# Patient Record
Sex: Female | Born: 1935 | Smoking: Never smoker
Health system: Southern US, Community
[De-identification: ages and names within clinical notes are randomized; demographics above are authoritative.]

## PROBLEM LIST (undated history)

## (undated) DIAGNOSIS — E039 Hypothyroidism, unspecified: Secondary | ICD-10-CM

## (undated) DIAGNOSIS — E785 Hyperlipidemia, unspecified: Secondary | ICD-10-CM

## (undated) DIAGNOSIS — S329XXA Fracture of unspecified parts of lumbosacral spine and pelvis, initial encounter for closed fracture: Secondary | ICD-10-CM

## (undated) DIAGNOSIS — K922 Gastrointestinal hemorrhage, unspecified: Secondary | ICD-10-CM

## (undated) DIAGNOSIS — I509 Heart failure, unspecified: Secondary | ICD-10-CM

## (undated) DIAGNOSIS — I4891 Unspecified atrial fibrillation: Secondary | ICD-10-CM

## (undated) DIAGNOSIS — I10 Essential (primary) hypertension: Secondary | ICD-10-CM

---

## 2013-09-29 ENCOUNTER — Inpatient Hospital Stay (HOSPITAL_COMMUNITY): Payer: Medicare Other

## 2013-09-29 ENCOUNTER — Encounter (HOSPITAL_COMMUNITY): Payer: Self-pay | Admitting: Emergency Medicine

## 2013-09-29 ENCOUNTER — Emergency Department (HOSPITAL_COMMUNITY): Payer: Medicare Other

## 2013-09-29 ENCOUNTER — Inpatient Hospital Stay (HOSPITAL_COMMUNITY)
Admission: EM | Admit: 2013-09-29 | Discharge: 2013-10-02 | DRG: 061 | Disposition: A | Discharge status: Rehabilitation Facility | Payer: Medicare Other | Attending: Neurology | Admitting: Neurology

## 2013-09-29 ENCOUNTER — Encounter (HOSPITAL_COMMUNITY)
Admission: EM | Disposition: A | Discharge status: Rehabilitation Facility | Payer: Self-pay | Source: Home / Self Care | Attending: Neurology

## 2013-09-29 ENCOUNTER — Encounter (HOSPITAL_COMMUNITY): Payer: Medicare Other | Admitting: Anesthesiology

## 2013-09-29 ENCOUNTER — Encounter (HOSPITAL_COMMUNITY): Payer: Self-pay | Admitting: Anesthesiology

## 2013-09-29 ENCOUNTER — Emergency Department (HOSPITAL_COMMUNITY): Payer: Medicare Other | Admitting: Anesthesiology

## 2013-09-29 DIAGNOSIS — I6350 Cerebral infarction due to unspecified occlusion or stenosis of unspecified cerebral artery: Principal | ICD-10-CM

## 2013-09-29 DIAGNOSIS — Z79899 Other long term (current) drug therapy: Secondary | ICD-10-CM

## 2013-09-29 DIAGNOSIS — E785 Hyperlipidemia, unspecified: Secondary | ICD-10-CM | POA: Diagnosis present

## 2013-09-29 DIAGNOSIS — I509 Heart failure, unspecified: Secondary | ICD-10-CM | POA: Diagnosis present

## 2013-09-29 DIAGNOSIS — R29810 Facial weakness: Secondary | ICD-10-CM | POA: Diagnosis present

## 2013-09-29 DIAGNOSIS — E876 Hypokalemia: Secondary | ICD-10-CM | POA: Diagnosis present

## 2013-09-29 DIAGNOSIS — G9340 Encephalopathy, unspecified: Secondary | ICD-10-CM | POA: Diagnosis present

## 2013-09-29 DIAGNOSIS — G8110 Spastic hemiplegia affecting unspecified side: Secondary | ICD-10-CM | POA: Diagnosis present

## 2013-09-29 DIAGNOSIS — R9431 Abnormal electrocardiogram [ECG] [EKG]: Secondary | ICD-10-CM

## 2013-09-29 DIAGNOSIS — K274 Chronic or unspecified peptic ulcer, site unspecified, with hemorrhage: Secondary | ICD-10-CM | POA: Diagnosis present

## 2013-09-29 DIAGNOSIS — I4891 Unspecified atrial fibrillation: Secondary | ICD-10-CM | POA: Diagnosis present

## 2013-09-29 DIAGNOSIS — Z23 Encounter for immunization: Secondary | ICD-10-CM

## 2013-09-29 DIAGNOSIS — D696 Thrombocytopenia, unspecified: Secondary | ICD-10-CM | POA: Diagnosis present

## 2013-09-29 DIAGNOSIS — K219 Gastro-esophageal reflux disease without esophagitis: Secondary | ICD-10-CM | POA: Diagnosis present

## 2013-09-29 DIAGNOSIS — D649 Anemia, unspecified: Secondary | ICD-10-CM | POA: Diagnosis present

## 2013-09-29 DIAGNOSIS — R1310 Dysphagia, unspecified: Secondary | ICD-10-CM | POA: Diagnosis present

## 2013-09-29 DIAGNOSIS — I10 Essential (primary) hypertension: Secondary | ICD-10-CM | POA: Diagnosis present

## 2013-09-29 DIAGNOSIS — I639 Cerebral infarction, unspecified: Secondary | ICD-10-CM

## 2013-09-29 DIAGNOSIS — R29898 Other symptoms and signs involving the musculoskeletal system: Secondary | ICD-10-CM

## 2013-09-29 DIAGNOSIS — E039 Hypothyroidism, unspecified: Secondary | ICD-10-CM | POA: Diagnosis present

## 2013-09-29 DIAGNOSIS — I63511 Cerebral infarction due to unspecified occlusion or stenosis of right middle cerebral artery: Secondary | ICD-10-CM

## 2013-09-29 DIAGNOSIS — J9600 Acute respiratory failure, unspecified whether with hypoxia or hypercapnia: Secondary | ICD-10-CM | POA: Diagnosis present

## 2013-09-29 HISTORY — DX: Hypothyroidism, unspecified: E03.9

## 2013-09-29 HISTORY — DX: Unspecified atrial fibrillation: I48.91

## 2013-09-29 HISTORY — DX: Fracture of unspecified parts of lumbosacral spine and pelvis, initial encounter for closed fracture: S32.9XXA

## 2013-09-29 HISTORY — DX: Heart failure, unspecified: I50.9

## 2013-09-29 HISTORY — DX: Essential (primary) hypertension: I10

## 2013-09-29 HISTORY — DX: Gastrointestinal hemorrhage, unspecified: K92.2

## 2013-09-29 HISTORY — DX: Hyperlipidemia, unspecified: E78.5

## 2013-09-29 LAB — COMPREHENSIVE METABOLIC PANEL
Albumin: 3.5 g/dL (ref 3.5–5.2)
Alkaline Phosphatase: 98 U/L (ref 39–117)
BUN: 22 mg/dL (ref 6–23)
CO2: 28 mEq/L (ref 19–32)
Calcium: 9.8 mg/dL (ref 8.4–10.5)
Chloride: 96 mEq/L (ref 96–112)
GFR calc Af Amer: 71 mL/min — ABNORMAL LOW (ref >90)
GFR calc non Af Amer: 61 mL/min — ABNORMAL LOW (ref >90)
Glucose, Bld: 158 mg/dL — ABNORMAL HIGH (ref 70–99)
Potassium: 3.4 mEq/L — ABNORMAL LOW (ref 3.5–5.1)
Total Bilirubin: 0.3 mg/dL (ref 0.3–1.2)

## 2013-09-29 LAB — GLUCOSE, CAPILLARY: Glucose-Capillary: 134 mg/dL — ABNORMAL HIGH (ref 70–99)

## 2013-09-29 LAB — POCT I-STAT, CHEM 8
BUN: 26 mg/dL — ABNORMAL HIGH (ref 6–23)
Glucose, Bld: 157 mg/dL — ABNORMAL HIGH (ref 70–99)
HCT: 36 % (ref 36.0–46.0)
Hemoglobin: 12.2 g/dL (ref 12.0–15.0)
Sodium: 139 mEq/L (ref 135–145)
TCO2: 30 mmol/L (ref 0–100)

## 2013-09-29 LAB — RAPID URINE DRUG SCREEN, HOSP PERFORMED
Amphetamines: NOT DETECTED
Cocaine: NOT DETECTED
Opiates: POSITIVE — AB

## 2013-09-29 LAB — PROTIME-INR: INR: 0.87 (ref 0.00–1.49)

## 2013-09-29 LAB — DIFFERENTIAL
Basophils Absolute: 0 10*3/uL (ref 0.0–0.1)
Lymphocytes Relative: 30 % (ref 12–46)
Lymphs Abs: 1.9 10*3/uL (ref 0.7–4.0)
Monocytes Relative: 10 % (ref 3–12)
Neutro Abs: 3.6 10*3/uL (ref 1.7–7.7)
Neutrophils Relative %: 58 % (ref 43–77)

## 2013-09-29 LAB — POCT I-STAT 3, ART BLOOD GAS (G3+)
Acid-Base Excess: 11 mmol/L — ABNORMAL HIGH (ref 0.0–2.0)
Bicarbonate: 35.1 mEq/L — ABNORMAL HIGH (ref 20.0–24.0)
O2 Saturation: 100 %
Patient temperature: 98.6
TCO2: 36 mmol/L (ref 0–100)
pCO2 arterial: 43.7 mmHg (ref 35.0–45.0)
pO2, Arterial: 493 mmHg — ABNORMAL HIGH (ref 80.0–100.0)

## 2013-09-29 LAB — URINALYSIS, ROUTINE W REFLEX MICROSCOPIC
Glucose, UA: NEGATIVE mg/dL
Hgb urine dipstick: NEGATIVE
Leukocytes, UA: NEGATIVE
Protein, ur: NEGATIVE mg/dL

## 2013-09-29 LAB — CBC
HCT: 32.8 % — ABNORMAL LOW (ref 36.0–46.0)
Hemoglobin: 10.1 g/dL — ABNORMAL LOW (ref 12.0–15.0)
MCH: 24.7 pg — ABNORMAL LOW (ref 26.0–34.0)
MCV: 80.2 fL (ref 78.0–100.0)
RBC: 4.09 MIL/uL (ref 3.87–5.11)

## 2013-09-29 LAB — POCT I-STAT TROPONIN I: Troponin i, poc: 0.02 ng/mL (ref 0.00–0.08)

## 2013-09-29 LAB — TROPONIN I: Troponin I: <0.3 ng/mL (ref <0.30)

## 2013-09-29 LAB — APTT: aPTT: 20 seconds — ABNORMAL LOW (ref 24–37)

## 2013-09-29 SURGERY — RADIOLOGY WITH ANESTHESIA
Anesthesia: General

## 2013-09-29 MED ORDER — LIDOCAINE HCL (CARDIAC) 20 MG/ML IV SOLN
INTRAVENOUS | Status: DC | PRN
  Administered 2013-09-29: 50 mg via INTRAVENOUS

## 2013-09-29 MED ORDER — INSULIN ASPART 100 UNIT/ML ~~LOC~~ SOLN
0.0000 [IU] | SUBCUTANEOUS | Status: DC
  Administered 2013-09-29 – 2013-10-02 (×2): 2 [IU] via SUBCUTANEOUS
  Administered 2013-10-02: 1 [IU] via SUBCUTANEOUS

## 2013-09-29 MED ORDER — SOTALOL HCL 80 MG PO TABS
80.0000 mg | ORAL_TABLET | Freq: Two times a day (BID) | ORAL | Status: DC
  Administered 2013-09-30 – 2013-10-02 (×5): 80 mg via ORAL
  Filled 2013-09-29 (×6): qty 1

## 2013-09-29 MED ORDER — PANTOPRAZOLE SODIUM 40 MG IV SOLR
40.0000 mg | Freq: Every day | INTRAVENOUS | Status: DC
  Administered 2013-09-29 – 2013-09-30 (×2): 40 mg via INTRAVENOUS
  Filled 2013-09-29 (×3): qty 40

## 2013-09-29 MED ORDER — PROPOFOL 10 MG/ML IV EMUL
5.0000 ug/kg/min | INTRAVENOUS | Status: DC
  Administered 2013-09-29: 30 ug/kg/min via INTRAVENOUS
  Administered 2013-09-30: 20 ug/kg/min via INTRAVENOUS
  Filled 2013-09-29 (×2): qty 100

## 2013-09-29 MED ORDER — NITROGLYCERIN 1 MG/10 ML FOR IR/CATH LAB
INTRA_ARTERIAL | Status: AC
  Administered 2013-09-29: 1 ug
  Filled 2013-09-29: qty 10

## 2013-09-29 MED ORDER — MIDAZOLAM HCL 2 MG/2ML IJ SOLN
INTRAMUSCULAR | Status: AC
  Filled 2013-09-29: qty 2

## 2013-09-29 MED ORDER — ACETAMINOPHEN 650 MG RE SUPP: 650.0000 mg | Freq: Four times a day (QID) | RECTAL | Status: DC | PRN

## 2013-09-29 MED ORDER — FENTANYL CITRATE 0.05 MG/ML IJ SOLN
INTRAMUSCULAR | Status: AC
  Filled 2013-09-29: qty 2

## 2013-09-29 MED ORDER — SODIUM CHLORIDE 0.9 % IV SOLN
INTRAVENOUS | Status: DC
  Administered 2013-09-29 – 2013-09-30 (×2): via INTRAVENOUS

## 2013-09-29 MED ORDER — BIOTENE DRY MOUTH MT LIQD
15.0000 mL | Freq: Four times a day (QID) | OROMUCOSAL | Status: DC
  Administered 2013-09-29 – 2013-10-01 (×9): 15 mL via OROMUCOSAL

## 2013-09-29 MED ORDER — LABETALOL HCL 5 MG/ML IV SOLN
INTRAVENOUS | Status: DC | PRN
  Administered 2013-09-29 (×3): 10 mg via INTRAVENOUS

## 2013-09-29 MED ORDER — LABETALOL HCL 5 MG/ML IV SOLN
10.0000 mg | INTRAVENOUS | Status: DC | PRN
  Administered 2013-09-29 (×2): 5 mg via INTRAVENOUS
  Filled 2013-09-29 (×2): qty 4

## 2013-09-29 MED ORDER — ACETAMINOPHEN 325 MG PO TABS: 650.0000 mg | ORAL_TABLET | ORAL | Status: DC | PRN

## 2013-09-29 MED ORDER — STROKE: EARLY STAGES OF RECOVERY BOOK
Freq: Once | Status: AC
  Administered 2013-09-29: 18:00:00
  Filled 2013-09-29: qty 1

## 2013-09-29 MED ORDER — POTASSIUM CHLORIDE 10 MEQ/100ML IV SOLN: 10.0000 meq | Freq: Once | INTRAVENOUS | Status: DC

## 2013-09-29 MED ORDER — CEFAZOLIN SODIUM 1-5 GM-% IV SOLN
INTRAVENOUS | Status: DC | PRN
  Administered 2013-09-29: 2 g via INTRAVENOUS

## 2013-09-29 MED ORDER — ALTEPLASE 30 MG/30 ML FOR INTERV. RAD
1.0000 mg | INTRA_ARTERIAL | Status: AC | PRN
  Administered 2013-09-29: 5.1 mg via INTRA_ARTERIAL
  Filled 2013-09-29: qty 30

## 2013-09-29 MED ORDER — FENTANYL CITRATE 0.05 MG/ML IJ SOLN
INTRAMUSCULAR | Status: DC | PRN
  Administered 2013-09-29: 100 ug via INTRAVENOUS
  Administered 2013-09-29: 50 ug via INTRAVENOUS
  Administered 2013-09-29: 100 ug via INTRAVENOUS

## 2013-09-29 MED ORDER — IOHEXOL 300 MG/ML  SOLN
150.0000 mL | Freq: Once | INTRAMUSCULAR | Status: AC | PRN
  Administered 2013-09-29: 70 mL via INTRA_ARTERIAL

## 2013-09-29 MED ORDER — MIDAZOLAM HCL 2 MG/2ML IJ SOLN
INTRAMUSCULAR | Status: AC | PRN
  Administered 2013-09-29: 1 mg via INTRAVENOUS

## 2013-09-29 MED ORDER — CHLORHEXIDINE GLUCONATE 0.12 % MT SOLN
15.0000 mL | Freq: Two times a day (BID) | OROMUCOSAL | Status: DC
  Administered 2013-09-29 – 2013-10-01 (×5): 15 mL via OROMUCOSAL
  Filled 2013-09-29 (×5): qty 15

## 2013-09-29 MED ORDER — NICARDIPINE HCL IN NACL 20-0.86 MG/200ML-% IV SOLN
5.0000 mg/h | INTRAVENOUS | Status: DC
  Administered 2013-09-29 (×2): 5 mg/h via INTRAVENOUS
  Administered 2013-09-30: 3 mg/h via INTRAVENOUS
  Administered 2013-09-30: 5 mg/h via INTRAVENOUS
  Filled 2013-09-29 (×4): qty 200

## 2013-09-29 MED ORDER — SODIUM CHLORIDE 0.9 % IV SOLN: INTRAVENOUS | Status: DC

## 2013-09-29 MED ORDER — INFLUENZA VAC SPLIT QUAD 0.5 ML IM SUSP
0.5000 mL | INTRAMUSCULAR | Status: AC
  Administered 2013-09-30: 0.5 mL via INTRAMUSCULAR
  Filled 2013-09-29: qty 0.5

## 2013-09-29 MED ORDER — ALTEPLASE (STROKE) FULL DOSE INFUSION
59.0000 mg | Freq: Once | INTRAVENOUS | Status: DC
  Filled 2013-09-29: qty 59

## 2013-09-29 MED ORDER — PROPOFOL 10 MG/ML IV BOLUS
INTRAVENOUS | Status: DC | PRN
  Administered 2013-09-29: 100 mg via INTRAVENOUS

## 2013-09-29 MED ORDER — SODIUM CHLORIDE 0.9 % IV SOLN
INTRAVENOUS | Status: DC | PRN
  Administered 2013-09-29: 10:00:00 via INTRAVENOUS

## 2013-09-29 MED ORDER — SUCCINYLCHOLINE CHLORIDE 20 MG/ML IJ SOLN
INTRAMUSCULAR | Status: DC | PRN
  Administered 2013-09-29: 110 mg via INTRAVENOUS

## 2013-09-29 MED ORDER — ACETAMINOPHEN 650 MG RE SUPP: 650.0000 mg | RECTAL | Status: DC | PRN

## 2013-09-29 MED ORDER — FENTANYL CITRATE 0.05 MG/ML IJ SOLN
INTRAMUSCULAR | Status: AC | PRN
  Administered 2013-09-29: 25 ug via INTRAVENOUS

## 2013-09-29 MED ORDER — ACETAMINOPHEN 500 MG PO TABS
1000.0000 mg | ORAL_TABLET | Freq: Four times a day (QID) | ORAL | Status: DC | PRN
  Administered 2013-09-30: 1000 mg via ORAL
  Filled 2013-09-29: qty 2

## 2013-09-29 MED ORDER — PNEUMOCOCCAL VAC POLYVALENT 25 MCG/0.5ML IJ INJ
0.5000 mL | INJECTION | INTRAMUSCULAR | Status: AC
  Administered 2013-09-30: 0.5 mL via INTRAMUSCULAR
  Filled 2013-09-29: qty 0.5

## 2013-09-29 MED ORDER — SODIUM CHLORIDE 0.9 % IV BOLUS (SEPSIS): 1000.0000 mL | Freq: Once | INTRAVENOUS | Status: DC

## 2013-09-29 MED ORDER — POTASSIUM CHLORIDE 20 MEQ/15ML (10%) PO LIQD
40.0000 meq | Freq: Once | ORAL | Status: AC
  Administered 2013-09-29: 40 meq
  Filled 2013-09-29: qty 30

## 2013-09-29 MED ORDER — PROPOFOL INFUSION 10 MG/ML OPTIME
INTRAVENOUS | Status: DC | PRN
  Administered 2013-09-29: 50 ug/kg/min via INTRAVENOUS

## 2013-09-29 MED ORDER — ROCURONIUM BROMIDE 100 MG/10ML IV SOLN
INTRAVENOUS | Status: DC | PRN
  Administered 2013-09-29 (×2): 50 mg via INTRAVENOUS

## 2013-09-29 MED ORDER — ONDANSETRON HCL 4 MG/2ML IJ SOLN
4.0000 mg | Freq: Four times a day (QID) | INTRAMUSCULAR | Status: DC | PRN
  Administered 2013-10-02: 4 mg via INTRAVENOUS
  Filled 2013-09-29: qty 2

## 2013-09-29 NOTE — Transfer of Care (Signed)
Immediate Anesthesia Transfer of Care Note  Patient: Tina Chambers  Procedure(s) Performed: * No procedures listed *  Patient Location: NICU  Anesthesia Type:General  Level of Consciousness: sedated  Airway & Oxygen Therapy: Patient remains intubated per anesthesia plan and Patient placed on Ventilator (see vital sign flow sheet for setting)  Post-op Assessment: Report given to PACU RN and Post -op Vital signs reviewed and stable  Post vital signs: Reviewed and stable  Complications: No apparent anesthesia complications

## 2013-09-29 NOTE — ED Notes (Signed)
Cannulation to R and L femoral arteries

## 2013-09-29 NOTE — Anesthesia Postprocedure Evaluation (Signed)
  Anesthesia Post-op Note  Patient: Tina Chambers  Procedure(s) Performed: * No procedures listed *  Patient Location: ICU  Anesthesia Type:General  Level of Consciousness: sedated and unresponsive  Airway and Oxygen Therapy: Patient remains intubated per anesthesia plan and Patient placed on Ventilator (see vital sign flow sheet for setting)  Post-op Pain: none  Post-op Assessment: Post-op Vital signs reviewed, Patient's Cardiovascular Status Stable and Respiratory Function Stable  Post-op Vital Signs: Reviewed and stable  Complications: No apparent anesthesia complications

## 2013-09-29 NOTE — Consult Note (Signed)
PULMONARY  / CRITICAL CARE MEDICINE  Name: Tina Chambers MRN: [id number] DOB: 12/08/1926    ADMISSION DATE:  09/29/2013 CONSULTATION DATE:  09/29/2013  REFERRING MD :  Neurology  PRIMARY SERVICE:  Neurology  CHIEF COMPLAINT:  Acute respiratory failure  BRIEF PATIENT DESCRIPTION: 79 yo with past medical history of HTN, AF (stoped Xarelto because of bleeding ulcers) and CHF admitted 11/4 with acute CVA rewiring IR intervention. Brought to ICU intubated.  SIGNIFICANT EVENTS / STUDIES:  11/4  Admitted with acute CVA 11/4  Head CT >>> nad 11/4  IR >>> R common carotid angiogram, revascularization of R MCA with thrombectomy device 11/4  In ICU, intubated  LINES / TUBES: OETT 11/4 >>> OGT 11/4 >>> Foley 11/4 >>> R fem A-line 11/4 >>> L fem A-line 11/4 >>>  CULTURES:  ANTIBIOTICS:  The patient is encephalopathic and unable to provide history, which was obtained for available medical records.  HISTORY OF PRESENT ILLNESS:  79 yo with past medical history of HTN, AF (stoped Xarelto because of bleeding ulcers) and CHF admitted 11/4 with acute CVA rewiring IR intervention. Brought to ICU intubated.  PAST MEDICAL HISTORY :  Past Medical History  Diagnosis Date  . Hypertension   . Atrial fibrillation   . CHF (congestive heart failure)   . Hypothyroid   . Hyperlipidemia    History reviewed. No pertinent past surgical history. Prior to Admission medications   Not on File   Allergies  Allergen Reactions  . Phenergan [Promethazine Hcl]    FAMILY HISTORY:  No family history on file.  SOCIAL HISTORY:  has no tobacco, alcohol, and drug history on file.  REVIEW OF SYSTEMS:  Unable to provide.  INTERVAL HISTORY:  VITAL SIGNS: Temp:  [98.6 F (37 C)] 98.6 F (37 C) (11/04 0907) Pulse Rate:  [42-99] 92 (11/04 1005) Resp:  [16-20] 16 (11/04 1005) BP: (142-193)/(66-100) 193/87 mmHg (11/04 1005) SpO2:  [98 %-100 %] 100 % (11/04 1005) Weight:  [65.4 kg (144 lb 2.9 oz)]  65.4 kg (144 lb 2.9 oz) (11/04 0907)  HEMODYNAMICS:   VENTILATOR SETTINGS:   INTAKE / OUTPUT: Intake/Output     11/03 0701 - 11/04 0700 11/04 0701 - 11/05 0700   I.V. (mL/kg)  800 (12.2)   Total Intake(mL/kg)  800 (12.2)   Blood  50   Total Output   50   Net   +750         PHYSICAL EXAMINATION: General:  Mechanically ventilated, synchronous Neuro:  Encephalopathic, nonfocal, cough / gag diminished HEENT:  PERRL, OETT Cardiovascular:  RRR, no m/r/g Lungs:  Bilateral diminished air entry, no w/r/r Abdomen:  Soft, nontender, bowel sounds diminished Musculoskeletal:  No edema Skin:  Intact  LABS:  Recent Labs Lab 09/29/13 0849 09/29/13 0853 09/29/13 0902  HGB  --  10.1* 12.2  WBC  --  6.3  --   PLT  --  PLATELET CLUMPS NOTED ON SMEAR, UNABLE TO ESTIMATE  --   NA  --  138 139  K  --  3.4* 3.1*  CL  --  96 95*  CO2  --  28  --   GLUCOSE  --  158* 157*  BUN  --  22 26*  CREATININE  --  0.85 1.10  CALCIUM  --  9.8  --   MG 1.6  --   --   AST  --  28  --   ALT  --  17  --   ALKPHOS  --  98  --   BILITOT  --  0.3  --   PROT  --  6.9  --   ALBUMIN  --  3.5  --   APTT  --  20*  --   INR  --  0.87  --   TROPONINI  --  <0.30  --    No results found for this basename: GLUCAP,  in the last 168 hours  CXR:  11/4 >>>  ASSESSMENT / PLAN:  PULMONARY A:  Acute respiratory failure in setting of acute CVA / intervention. P:   Gaol SpO2>92, pH>7.30 Full mechanical support Daily SBT Ventilator bundle Trend ABG / CXR  CARDIOVASCULAR A: NTH. PAF. Dyslipidemia. P:  BP goals per IR / Neurology  RENAL A:  Hypokalemia. P:   Trend BMP K 40 x 1 NS@75   GASTROINTESTINAL A:  No active issues. P:   NPO as intubated Protonix for GI Px NGT  HEMATOLOGIC A:  No active issues. P:  Trend CBC SCDs for DVT Px  INFECTIOUS A:  No acute issues. P:   No intervention required  ENDOCRINE  A:  Hyperglycemia. P:   SSI  NEUROLOGIC A:  Acute CVA s/p thrombectomy P:    Per Neurology Goal RASS 0 to -1 Propofol gtt  I have personally obtained history, examined patient, evaluated and interpreted laboratory and imaging results, reviewed medical records, formulated assessment / plan and placed orders.  CRITICAL CARE:  The patient is critically ill with multiple organ systems failure and requires high complexity decision making for assessment and support, frequent evaluation and titration of therapies, application of advanced monitoring technologies and extensive interpretation of multiple databases. Critical Care Time devoted to patient care services described in this note is 40 minutes.   Lonia Farber, MD Pulmonary and Critical Care Medicine [facility] Pager: [phone]  09/29/2013, 12:46 PM

## 2013-09-29 NOTE — Consult Note (Signed)
Neurology Consultation Reason for Consult: Left side weakness Referring Physician: ED  CC: left side weakness  History is obtained from: patient and EMS   HPI: Tina Chambers is a 79 y.o. female with PMH of HTN, hyperlipidemia, CHF, hypothyroidism, A. Fib (currently not on blood thinner), recent Gi bleeding secondary to peptic ulcer disease, who presents with left-sided weakness.  Patient is a resident of assisted living facility ([address] She has been doing fine until this morning. She was walking this morning, went to sit down at 8:00 AM. The workers noticed that she wasn't moving or interacting as normal. She was found to have left sided weakness and facial droopy. She could not move her left arm and leg initially. EMS was called and took patient to ED. She was found to have right sided gaze, left facial droopy and left arm weakness in ED. She moves both her legs well. She is interactive for interview and answers questions with slurry speech. Stat CT head has no intracranial bleeding. Her blood pressure is 142/66, heart rate 90, oxygen saturation 94% on room air.  Of note, patient has hx GI bleeding secondary to peptic ulcer disease. Most recent GI bleeding was proximately 2 weeks ago. She has a. fib, and was blood thinner before, which was discontinued because of recent GI bleeding.  LKW: 8:00AM tpa given: no, recent GI bleed NIHSS: 21  ROS: A 14 point ROS was performed and is negative except as noted in the HPI.  Past Medical History  Diagnosis Date  . Hypertension   . Atrial fibrillation     Social History: she is a resident in assisted living facility, has 4 children, denies smoking, drinking alcohol, using drugs, or recent long distance traveling. Tob: none  Exam: Current vital signs: BP 142/66  Pulse 88  Temp(Src) 98.6 F (37 C) (Oral)  Resp 20  Wt 144 lb 2.9 oz (65.4 kg)  SpO2 100% Vital signs in last 24 hours: Temp:  [98.6 F (37 C)] 98.6 F (37  C) (11/04 0907) Pulse Rate:  [88] 88 (11/04 0907) Resp:  [20] 20 (11/04 0907) BP: (142)/(66) 142/66 mmHg (11/04 0907) SpO2:  [98 %-100 %] 100 % (11/04 0907) Weight:  [144 lb 2.9 oz (65.4 kg)] 144 lb 2.9 oz (65.4 kg) (11/04 1191)   General: Not in acute distress.  HEENT: Right eye gaze deviation.  no scleral icterus, No JVD or bruit, L pupils slightly larger than right.  Cardiac: S1/S2, irregular rhythm,  No murmurs, gallops or rubs Pulm: Good air movement bilaterally. Clear to auscultation bilaterally. No rales, wheezing, rhonchi or rubs. Abd: Soft, nondistended, nontender, no rebound pain, no organomegaly, BS present Ext: No edema. 2+DP/PT pulse bilaterally Musculoskeletal: No joint deformities, erythema, or stiffness Skin: No rashes.  Neuro:  Mental Status: Alert, oriented to person (recognizing his on the bedside), slurry speech. Cranial Nerves: II: decreased vision on the left side. III,IV, V,VI: Decreased sensation on the left face,  R eye gaze deviation  VII: left facial droopy VIII: hearing normal bilaterally IX,X: not able assess XI: not be able to assess XII: not be able to assess Motor: Right : Upper and lower extremity: 5/5 ;  Left:    Upper extremity 0/5 to 1/5; lower extremity: 4/5  Sensory: Pinprick and light touch decreased on the left side of body and face Deep Tendon Reflexes: 1+ and symmetric for knee reflex Plantars: Right: downgoing;   Left: upgoing Cerebellar: not be able to assess  Assessment and plan:  #: Ischemic stroke: Patient presents with acute onset of left upper and lower extremity paresthesia and weakness. CT head has no intracranial bleeding, CNS lesion, SDH/SAH, or other acute process. It is most likely due to ischemic stroke, possibly involved MCA. The etiology is likely due to A. fib. Because of GI bleeding secondary to peptic ulcer, patient is not taking blood thinner currently. Other risk factors include hyperlipidemia, hypertension and  hypothyroidism. Although patient is still within the window for tPA, her history of recent GI bleeding is contraindicated to tPA. Currently patient is hemodynamically stable. .   - NPO until SLP done - consult to IR for angiogram and intervention - Obtain MRI to evaluate for acute stroke - Check carotid dopplers to assess for stenosis - 2D echo - hold blood thinner now - Risk factor stratification: Obtain fasting lipid panel, Hgb A1c and TSH - allow permissive HTN - PT/OT consult when stabilized.  #: HTN: bp is 142/66 on admission. -will allow permissive hypertension  -Will hold blood pressure medications  #: HLD: on Lipitor 10 mg at home -will continue Lipitor -check FLP, may increase the dose of Lipitor if not at goal  #: A. Fib: Patient is not a candidate for Coumadin because of active peptic ulcer. Her heart rate is well controlled by cardiogram and sotalolol. Heart rate is 90 on admission. hold Cardizem for now, but will continue sotalolol   #:  Hypothyroidism: Patient is on Synthroid 75 mcg daily at home. -will continue Synthroid  -Check TSH  #: CHF: Patient has congestive heart failure per her son. No 2-D echo record the Epic. Currently patient is euvolemic. She does not have leg edema or JVD. Her lung auscultation is clear bilaterally. She has been on low dose of Lasix 10 mg daily at home. -will hold Lasix to allow permissive hypertension in the setting of acute ischemic stroke. -pending 2D echo.   Lorretta Harp, MD PGY3, Internal Medicine Teaching Service Pager: [phone]  I have seen and evaluated the patient. I have reviewed the above note and agree with the above.   79 yo was eating breakfast when she had acute onset left sided weakness.   She had left sided weakness,   Recent GI bleed, so no IV tpa, but went to intervention and found to have M1 occlusion which was opened with IA tpa and solitaire.   See above for further details.    This patient is critically  ill and at significant risk of neurological worsening, death and care requires constant monitoring of vital signs, hemodynamics,respiratory and cardiac monitoring, neurological assessment, discussion with family, other specialists and medical decision making of high complexity. I spent 60 minutes of neurocritical care time  in the care of  this patient.  Ritta Slot, MD [facility] [phone]  If 7pm- 7am, please page neurology on call at [phone].  09/29/2013  10:13 PM

## 2013-09-29 NOTE — Plan of Care (Signed)
Problem: tPA Day Progression Outcomes-Only if tPA administered Goal: Inclusion criteria for tPA STANDARD: < 3hours from symptoms onset: - Diagnosis of ischemic stroke causing measureable neurological deficit - Neurological signs shold not be minor & isolated - Symptoms of stroke should not be sugestive of SAH - No head trauma or prior stroke in previous 3 months - No gastrointestinal or urinary tract hemorrhage in previous 21 days - No arterial puncture at a noncompressible site in previous 7 days - BP not elevated [systolic <185 mmHg, diastolic < 110 mmHg] - Not taking oral anticoagulant or if being taken INR < or equal 1.7 - Platelet count > or equal 100,000 mm3 - No seizure w/postictal residual neurological impairments - Pt/family understand potential risks/benefits from treatment - Caution in pts with NIHSS > 22 - Seizure at stroke onset eligible for tPA if residual impairments due to stroke and not the seizures. - Neurological signs should not be clearing spontaneously - Exercise caution in treating pt with major deficits - Symptoms onset < 3hrs before beginning treatment - No MI in previous 3 months - No major surgery in previous 14 days - No history previous intracranial hemorrhage - No evidence active bleeding/acute trauma (fracture) on examinations - If receiving heparin in previous 48 hrs, aPTT must be normal range - Abnormal Blood Glucose < 50 or > 400 mg/dL - CT does not show multilobar infarction [hypodensity >1/3 cerebral hemisphere] EXTENDED: 3-4.5 hrous from symptom onset - Age > 80 years - History of prior stroke and diabetes - Any anticoagulant use prior to admission (even if INR < 1.7) - NIHSS > 25"  Outcome: Not Applicable Date Met:  09/29/13 Pt not a candidate for tpa d/t recent GI bleed

## 2013-09-29 NOTE — Code Documentation (Signed)
79yo female from [facility] assisted living arriving to [facility] via [facility] at [phone].  Patient was at her baseline this morning at 0800 when she walked with staff.  Later she was found in a chair not interacting per her usual with other people.  EMS noted L facial droop and L sided weakness on assessment.  EMS reports that patient was in afib en route and began moving L side before arriving to the hospital.  Code stroke called at 0831, LKW 0800, EDP exam/cleared for CT at [phone], stroke team arrival at 0840, neurologist arrival at [phone], patient arrival in CT at [phone], phlebotomist arrival at [phone].  NIHSS 21 on arrival, see documentation for details.  Patient contraindicated for treatment with tPA d/t recent GI bleed.  Son reports that patient had been on Xarelto for her afib, but stopped taking the medication d/t bleeding ulcers.  Son confirms that patient had blood in her stool 3 weeks ago.  IR notified at 0905, and patient arrival in IR at 71.  Patient's son and daughter updated on plan of care and agreeable with patient undergoing angiogram and possible intervention.  Handoff completed with IR RN and staff.

## 2013-09-29 NOTE — ED Notes (Signed)
Pt transported to IR by IR staff. Belongings given to Pt family member

## 2013-09-29 NOTE — ED Notes (Signed)
Pt was from [facility] ([address] was walking this morning, went to sit down at 0800. Workers noticed she wasn't moving or interacting as normal. Noticed left sided droop and weakness. Ems was called. Per ems, right sided gaze, left sided weakness, and L facial droop. Pt was unable to move left leg until few minutes before arrival.

## 2013-09-29 NOTE — H&P (Signed)
Neurology Consultation Reason for Consult: Left side weakness Referring Physician: ED  CC: left side weakness  History is obtained from: patient and EMS   HPI: Tina Chambers is a 79 y.o. female with PMH of HTN, hyperlipidemia, CHF, hypothyroidism, A. Fib (currently not on blood thinner), recent Gi bleeding secondary to peptic ulcer disease, who presents with left-sided weakness.  Patient is a resident of assisted living facility ([address] She has been doing fine until this morning. She was walking this morning, went to sit down at 8:00 AM. The workers noticed that she wasn't moving or interacting as normal. She was found to have left sided weakness and facial droopy. She could not move her left arm and leg initially. EMS was called and took patient to ED. She was found to have right sided gaze, left facial droopy and left arm weakness in ED. She moves both her legs well. She is interactive for interview and answers questions with slurry speech. Stat CT head has no intracranial bleeding. Her blood pressure is 142/66, heart rate 90, oxygen saturation 94% on room air.  Of note, patient has hx GI bleeding secondary to peptic ulcer disease. Most recent GI bleeding was proximately 2 weeks ago. She has a. fib, and was blood thinner before, which was discontinued because of recent GI bleeding.  LKW: 8:00AM tpa given: no, recent GI bleed NIHSS: 21  ROS: A 14 point ROS was performed and is negative except as noted in the HPI.  Past Medical History  Diagnosis Date  . Hypertension   . Atrial fibrillation     Social History: she is a resident in assisted living facility, has 4 children, denies smoking, drinking alcohol, using drugs, or recent long distance traveling. Tob: none  Exam: Current vital signs: BP 142/66  Pulse 88  Temp(Src) 98.6 F (37 C) (Oral)  Resp 20  Wt 144 lb 2.9 oz (65.4 kg)  SpO2 100% Vital signs in last 24 hours: Temp:  [98.6 F (37 C)] 98.6 F (37  C) (11/04 0907) Pulse Rate:  [88] 88 (11/04 0907) Resp:  [20] 20 (11/04 0907) BP: (142)/(66) 142/66 mmHg (11/04 0907) SpO2:  [98 %-100 %] 100 % (11/04 0907) Weight:  [144 lb 2.9 oz (65.4 kg)] 144 lb 2.9 oz (65.4 kg) (11/04 0907)   General: Not in acute distress.  HEENT: Right eye gaze deviation.  no scleral icterus, No JVD or bruit, L pupils slightly larger than right.  Cardiac: S1/S2, irregular rhythm,  No murmurs, gallops or rubs Pulm: Good air movement bilaterally. Clear to auscultation bilaterally. No rales, wheezing, rhonchi or rubs. Abd: Soft, nondistended, nontender, no rebound pain, no organomegaly, BS present Ext: No edema. 2+DP/PT pulse bilaterally Musculoskeletal: No joint deformities, erythema, or stiffness Skin: No rashes.  Neuro:  Mental Status: Alert, oriented to person (recognizing his on the bedside), slurry speech. Cranial Nerves: II: decreased vision on the left side. III,IV, V,VI: Decreased sensation on the left face,  R eye gaze deviation  VII: left facial droopy VIII: hearing normal bilaterally IX,X: not able assess XI: not be able to assess XII: not be able to assess Motor: Right : Upper and lower extremity: 5/5 ;  Left:    Upper extremity 0/5 to 1/5; lower extremity: 4/5  Sensory: Pinprick and light touch decreased on the left side of body and face Deep Tendon Reflexes: 1+ and symmetric for knee reflex Plantars: Right: downgoing;   Left: upgoing Cerebellar: not be able to assess     Assessment and plan:  #: Ischemic stroke: Patient presents with acute onset of left upper and lower extremity paresthesia and weakness. CT head has no intracranial bleeding, CNS lesion, SDH/SAH, or other acute process. It is most likely due to ischemic stroke, possibly involved MCA. The etiology is likely due to A. fib. Because of GI bleeding secondary to peptic ulcer, patient is not taking blood thinner currently. Other risk factors include hyperlipidemia, hypertension and  hypothyroidism. Although patient is still within the window for tPA, her history of recent GI bleeding is contraindicated to tPA. Currently patient is hemodynamically stable. .   - NPO until SLP done - consult to IR for angiogram and intervention - Obtain MRI to evaluate for acute stroke - Check carotid dopplers to assess for stenosis - 2D echo - hold blood thinner now - Risk factor stratification: Obtain fasting lipid panel, Hgb A1c and TSH - allow permissive HTN - PT/OT consult when stabilized.  #: HTN: bp is 142/66 on admission. -will allow permissive hypertension  -Will hold blood pressure medications  #: HLD: on Lipitor 10 mg at home -will continue Lipitor -check FLP, may increase the dose of Lipitor if not at goal  #: A. Fib: Patient is not a candidate for Coumadin because of active peptic ulcer. Her heart rate is well controlled by cardiogram and sotalolol. Heart rate is 90 on admission. hold Cardizem for now, but will continue sotalolol   #:  Hypothyroidism: Patient is on Synthroid 75 mcg daily at home. -will continue Synthroid  -Check TSH  #: CHF: Patient has congestive heart failure per her son. No 2-D echo record the Epic. Currently patient is euvolemic. She does not have leg edema or JVD. Her lung auscultation is clear bilaterally. She has been on low dose of Lasix 10 mg daily at home. -will hold Lasix to allow permissive hypertension in the setting of acute ischemic stroke. -pending 2D echo.   Xilin Niu, MD PGY3, Internal Medicine Teaching Service Pager: [phone]  I have seen and evaluated the patient. I have reviewed the above note and agree with the above.   79 yo was eating breakfast when she had acute onset left sided weakness.   She had left sided weakness,   Recent GI bleed, so no IV tpa, but went to intervention and found to have M1 occlusion which was opened with IA tpa and solitaire.   See above for further details.    This patient is critically  ill and at significant risk of neurological worsening, death and care requires constant monitoring of vital signs, hemodynamics,respiratory and cardiac monitoring, neurological assessment, discussion with family, other specialists and medical decision making of high complexity. I spent 60 minutes of neurocritical care time  in the care of  this patient.  Katianne Barre, MD [facility] [phone]  If 7pm- 7am, please page neurology on call at [phone].  09/29/2013  10:13 PM               

## 2013-09-29 NOTE — Progress Notes (Signed)
1800- nursing note  R arm BP 94/67, L arm 110/51. L groin A-line reading 120s-130s, R groin sheath reading 110s-120s, approximately 10 apart. Dr. Corliss Skains aware. Per MD, go by A-line for BP control with cardene, goal SBP 120-130.

## 2013-09-29 NOTE — ED Notes (Signed)
Turn care over to anesthesia

## 2013-09-29 NOTE — ED Notes (Signed)
Pt waiting to go to IR.

## 2013-09-29 NOTE — Preoperative (Signed)
Beta Blockers   Reason not to administer Beta Blockers:Not Applicable 

## 2013-09-29 NOTE — ED Provider Notes (Signed)
CSN: [id number]     Arrival date & time 09/29/13  0840 History   First MD Initiated Contact with Patient 09/29/13 0915     Chief Complaint  Patient presents with  . Code Stroke   (Consider location/radiation/quality/duration/timing/severity/associated sxs/prior Treatment) HPI Comments: 79 yo female with unknown medical hx presents Code stroke after she was found not moving left arm, facial droop PTA, last seen normal 8 am.  No falls or head injuries.  Pt was on blood thinner recently, recent GI bleed 2 wks prior.  Difficult to obtain details due to acuity/ stroke sxs.  Nothing has improved sxs, glu okay.  No known stroke hx.   The history is provided by the patient and the EMS personnel.    Past Medical History  Diagnosis Date  . Hypertension   . Atrial fibrillation   . CHF (congestive heart failure)   . Hypothyroid   . Hyperlipidemia    History reviewed. No pertinent past surgical history. History reviewed. No pertinent family history. History  Substance Use Topics  . Smoking status: Never Smoker   . Smokeless tobacco: Not on file  . Alcohol Use: No   OB History   Grav Para Term Preterm Abortions TAB SAB Ect Mult Living                 Review of Systems  Unable to perform ROS: Acuity of condition    Allergies  Phenergan  Home Medications  No current outpatient prescriptions on file. BP 123/50  Pulse 81  Temp(Src) 96.6 F (35.9 C) (Axillary)  Resp 15  Ht 5\' 4"  (1.626 m)  Wt 137 lb 2 oz (62.2 kg)  BMI 23.53 kg/m2  SpO2 100% Physical Exam  Nursing note and vitals reviewed. Constitutional: She appears well-developed and well-nourished.  HENT:  Head: Normocephalic and atraumatic.  Eyes: Conjunctivae are normal. Right eye exhibits no discharge. Left eye exhibits no discharge.  Neck: Normal range of motion. Neck supple. No tracheal deviation present.  Cardiovascular: Normal rate and regular rhythm.   Pulmonary/Chest: Effort normal and breath sounds normal.   Abdominal: Soft. She exhibits no distension. There is no tenderness. There is no guarding.  Musculoskeletal: She exhibits no edema.  Neurological: She is alert. A cranial nerve deficit and sensory deficit is present. GCS eye subscore is 4. GCS verbal subscore is 4. GCS motor subscore is 5.  Left facial droop Right eye gaze Left arm flaccid paralysis, mild weakness left LE Difficult neuro exam due to acuity  Skin: Skin is warm. No rash noted.  Psychiatric: She has a normal mood and affect.    ED Course  Procedures (including critical care time) CRITICAL CARE Performed by: Enid Skeens   Total critical care time: 35 min  Critical care time was exclusive of separately billable procedures and treating other patients.  Critical care was necessary to treat or prevent imminent or life-threatening deterioration.  Critical care was time spent personally by me on the following activities: development of treatment plan with patient and/or surrogate as well as nursing, discussions with consultants, evaluation of patient's response to treatment, examination of patient, obtaining history from patient or surrogate, ordering and performing treatments and interventions, ordering and review of laboratory studies, ordering and review of radiographic studies, pulse oximetry and re-evaluation of patient's condition.  Labs Review Labs Reviewed  APTT - Abnormal; Notable for the following:    aPTT 20 (*)    All other components within normal limits  CBC - Abnormal; Notable  for the following:    Hemoglobin 10.1 (*)    HCT 32.8 (*)    MCH 24.7 (*)    RDW 17.9 (*)    All other components within normal limits  COMPREHENSIVE METABOLIC PANEL - Abnormal; Notable for the following:    Potassium 3.4 (*)    Glucose, Bld 158 (*)    GFR calc non Af Amer 61 (*)    GFR calc Af Amer 71 (*)    All other components within normal limits  URINE RAPID DRUG SCREEN (HOSP PERFORMED) - Abnormal; Notable for the  following:    Opiates POSITIVE (*)    All other components within normal limits  GLUCOSE, CAPILLARY - Abnormal; Notable for the following:    Glucose-Capillary 134 (*)    All other components within normal limits  POCT I-STAT, CHEM 8 - Abnormal; Notable for the following:    Potassium 3.1 (*)    Chloride 95 (*)    BUN 26 (*)    Glucose, Bld 157 (*)    Calcium, Ion 1.10 (*)    All other components within normal limits  POCT I-STAT 3, BLOOD GAS (G3+) - Abnormal; Notable for the following:    pH, Arterial 7.513 (*)    pO2, Arterial 493.0 (*)    Bicarbonate 35.1 (*)    Acid-Base Excess 11.0 (*)    All other components within normal limits  MRSA PCR SCREENING  ETHANOL  PROTIME-INR  DIFFERENTIAL  TROPONIN I  URINALYSIS, ROUTINE W REFLEX MICROSCOPIC  MAGNESIUM  GLUCOSE, CAPILLARY  HEMOGLOBIN A1C  LIPID PANEL  CBC WITH DIFFERENTIAL  BASIC METABOLIC PANEL  POCT I-STAT TROPONIN I   Imaging Review Ct Head Wo Contrast  09/29/2013   CLINICAL DATA:  Post tPA. Stroke.  EXAM: CT HEAD WITHOUT CONTRAST  TECHNIQUE: Contiguous axial images were obtained from the base of the skull through the vertex without intravenous contrast.  COMPARISON:  09/29/2013  FINDINGS: No area of hemorrhage. No acute infarction. No hydrocephalus. There is atrophy and chronic small vessel disease changes.  IMPRESSION: No hemorrhage or acute infarction.   Electronically Signed   By: Charlett Nose M.D.   On: 09/29/2013 12:46   Ct Head Wo Contrast  09/29/2013   CLINICAL DATA:  Code stroke. Right gaze preference.  EXAM: CT HEAD WITHOUT CONTRAST  TECHNIQUE: Contiguous axial images were obtained from the base of the skull through the vertex without intravenous contrast.  COMPARISON:  07/14/2013  FINDINGS: There is atrophy and chronic small vessel disease changes. Small old lacunar infarct in the left basal ganglia, stable. No acute intracranial abnormality. Specifically, no hemorrhage, hydrocephalus, mass lesion, acute  infarction, or significant intracranial injury. No acute calvarial abnormality.  IMPRESSION: No acute intracranial abnormality.  Critical Value/emergent results were called by telephone at the time of interpretation on 09/29/2013 at 9:07 AM to Dr.Kirkpatrick, who verbally acknowledged these results.   Electronically Signed   By: Charlett Nose M.D.   On: 09/29/2013 09:07   Dg Chest Port 1 View  09/29/2013   CLINICAL DATA:  Evaluate endotracheal tube.  EXAM: PORTABLE CHEST - 1 VIEW  COMPARISON:  Prior radiograph from 07/17/2013  FINDINGS: The tip of the endotracheal tube is located 3.7 cm above the carinal. Enteric tube courses into the abdomen.  Cardiac and mediastinal silhouettes are within normal limits.  Lungs are normally inflated. No focal infiltrate is identified. Mild bibasilar atelectasis is present. There is no pulmonary edema or pleural effusion. No pneumothorax.  Osseous structures are  within normal limits.  IMPRESSION: 1. Tip of endotracheal tube 3.7 cm above the carina. 2. Mild bibasilar atelectasis with no radiographic evidence of acute cardiopulmonary process.   Electronically Signed   By: Rise Mu M.D.   On: 09/29/2013 14:08   Dg Abd Portable 1v  09/29/2013   CLINICAL DATA:  OG T placement  EXAM: PORTABLE ABDOMEN - 1 VIEW  COMPARISON:  Prior abdominal radiographs 07/14/2013  FINDINGS: An orogastric tube is present. The tip of the tube is within the gastric fundus in apparently good position. The bowel gas pattern is unremarkable and not obstructed. A thermoister projects over the anatomic pelvis. Multilevel degenerative disc disease with dextroconvex scoliosis centered at L1-L2. Probable L1 and L4 compression fractures.  IMPRESSION: The tip of the orogastric tube projects over the gastric fundus.   Electronically Signed   By: Malachy Moan M.D.   On: 09/29/2013 14:05    EKG Interpretation     Ventricular Rate:  88 PR Interval:  173 QRS Duration: 105 QT Interval:  441 QTC  Calculation: 534 R Axis:   80 Text Interpretation:  Sinus rhythm Multiple premature complexes, vent  Minimal ST depression, inferior leads Prolonged QT interval Baseline wander in lead(s) V2            MDM   1. Acute ischemic stroke   2. Acute respiratory failure    Evaluated on arrival in EMS bay, acute stroke sxs, left facial droop, right eye gaze. Neuro arrived to assist.   Pt taken for stat CT scan.  With recent blood thinners/ GI bleed no systemic TPA, pt candidate for IR. Pt taken emergently to IR for local treatment.  The patients results and plan were reviewed and discussed.   Any x-rays performed were personally reviewed by myself.   Differential diagnosis were considered with the presenting HPI.  Diagnosis: Acute stroke, Left arm weakness, Confused  EKG: reviewed  Admission/ observation were discussed with the admitting physician, patient and/or family and they are comfortable with the plan.      Enid Skeens, MD 09/29/13 [phone]

## 2013-09-29 NOTE — Progress Notes (Signed)
14:00 Sons at bedside, one is pt's POA. Sons stated that pt has had cataract surgery, but they were unable to identify which eye. RN told sons that pupils were unequal and sons stated that unequal pupils have been noted before by an ophthamologist, (particularly one size 5 and one size 3), but they could not remember which eye exactly and the sons do not think that this was related to her cataract surgery.   Dr. Amada Jupiter updated that pt's pupils are unequal at baseline.   Holly Bodily

## 2013-09-29 NOTE — Anesthesia Preprocedure Evaluation (Addendum)
Anesthesia Evaluation  Patient identified by MRN, date of birth, ID band  Reviewed: Allergy & Precautions, H&P , NPO status , Patient's Chart, lab work & pertinent test results, Unable to perform ROS - Chart review only  Airway Mallampati: II TM Distance: >3 FB     Dental  (+) Teeth Intact   Pulmonary  breath sounds clear to auscultation        Cardiovascular hypertension, +CHF + dysrhythmias Atrial Fibrillation Rhythm:Regular Rate:Normal     Neuro/Psych    GI/Hepatic   Endo/Other  Hypothyroidism   Renal/GU      Musculoskeletal   Abdominal   Peds  Hematology   Anesthesia Other Findings   Reproductive/Obstetrics                          Anesthesia Physical Anesthesia Plan  ASA: III and emergent  Anesthesia Plan: General   Post-op Pain Management:    Induction: Intravenous, Rapid sequence and Cricoid pressure planned  Airway Management Planned: Oral ETT  Additional Equipment: Arterial line  Intra-op Plan:   Post-operative Plan: Post-operative intubation/ventilation  Informed Consent: I have reviewed the patients History and Physical, chart, labs and discussed the procedure including the risks, benefits and alternatives for the proposed anesthesia with the patient or authorized representative who has indicated his/her understanding and acceptance.   Dental advisory given  Plan Discussed with:   Anesthesia Plan Comments:        Anesthesia Quick Evaluation

## 2013-09-29 NOTE — ED Notes (Signed)
recannulization [id number]

## 2013-09-29 NOTE — Anesthesia Procedure Notes (Signed)
Procedure Name: Intubation Date/Time: 09/29/2013 10:40 AM Performed by: Brien Mates D Pre-anesthesia Checklist: Patient identified, Emergency Drugs available, Suction available, Timeout performed and Patient being monitored Patient Re-evaluated:Patient Re-evaluated prior to inductionOxygen Delivery Method: Circle system utilized Preoxygenation: Pre-oxygenation with 100% oxygen Intubation Type: IV induction, Cricoid Pressure applied and Rapid sequence Ventilation: Mask ventilation without difficulty Laryngoscope Size: Miller and 2 Grade View: Grade III Tube type: Subglottic suction tube Tube size: 7.0 mm Number of attempts: 2 (gentle DL by CRNA x 1 esophageal intubation. Gentle mask with CP. DL x 1 by MDA . Grade 3/4 view. Successul placement of ETT.) Airway Equipment and Method: Stylet Placement Confirmation: ETT inserted through vocal cords under direct vision,  positive ETCO2 and breath sounds checked- equal and bilateral Secured at: 21 cm Tube secured with: Tape Dental Injury: Teeth and Oropharynx as per pre-operative assessment

## 2013-09-29 NOTE — ED Notes (Signed)
Placed a 16 french temp foley into patient yellow urine in return

## 2013-09-29 NOTE — Procedures (Signed)
S/P RT  Common carotid arteriogram followed by complete revascularization(TICI3) of occluded Rt MCA prox using 5mg  of IA tpa and one pass with  The [employer] FR thrombectomy device .

## 2013-09-29 NOTE — ED Notes (Signed)
Neuro at bedside, family at bedside

## 2013-09-29 NOTE — Progress Notes (Signed)
Bilateral carotid artery duplex completed.  Right:  40-59% internal carotid artery stenosis.  Left:  1-39% ICA stenosis.  Bilateral:  Vertebral artery flow is antegrade.   

## 2013-09-30 ENCOUNTER — Inpatient Hospital Stay (HOSPITAL_COMMUNITY): Payer: Medicare Other

## 2013-09-30 DIAGNOSIS — R29898 Other symptoms and signs involving the musculoskeletal system: Secondary | ICD-10-CM

## 2013-09-30 DIAGNOSIS — I369 Nonrheumatic tricuspid valve disorder, unspecified: Secondary | ICD-10-CM

## 2013-09-30 LAB — CBC WITH DIFFERENTIAL/PLATELET
Basophils Absolute: 0 10*3/uL (ref 0.0–0.1)
Basophils Relative: 0 % (ref 0–1)
Eosinophils Absolute: 0.1 10*3/uL (ref 0.0–0.7)
Eosinophils Relative: 1 % (ref 0–5)
HCT: 30.2 % — ABNORMAL LOW (ref 36.0–46.0)
Hemoglobin: 9.5 g/dL — ABNORMAL LOW (ref 12.0–15.0)
MCHC: 31.5 g/dL (ref 30.0–36.0)
Monocytes Absolute: 0.7 10*3/uL (ref 0.1–1.0)
Monocytes Relative: 9 % (ref 3–12)
Neutro Abs: 5.3 10*3/uL (ref 1.7–7.7)
Platelets: 111 10*3/uL — ABNORMAL LOW (ref 150–400)
RDW: 18.4 % — ABNORMAL HIGH (ref 11.5–15.5)

## 2013-09-30 LAB — LIPID PANEL: Cholesterol: 152 mg/dL (ref 0–200)

## 2013-09-30 LAB — GLUCOSE, CAPILLARY
Glucose-Capillary: 103 mg/dL — ABNORMAL HIGH (ref 70–99)
Glucose-Capillary: 109 mg/dL — ABNORMAL HIGH (ref 70–99)
Glucose-Capillary: 86 mg/dL (ref 70–99)
Glucose-Capillary: 99 mg/dL (ref 70–99)

## 2013-09-30 LAB — BASIC METABOLIC PANEL
Calcium: 9.1 mg/dL (ref 8.4–10.5)
Chloride: 105 mEq/L (ref 96–112)
Creatinine, Ser: 0.68 mg/dL (ref 0.50–1.10)
GFR calc Af Amer: >90 mL/min (ref >90)
GFR calc non Af Amer: 78 mL/min — ABNORMAL LOW (ref >90)

## 2013-09-30 LAB — HEMOGLOBIN A1C: Mean Plasma Glucose: 105 mg/dL (ref <117)

## 2013-09-30 MED ORDER — POTASSIUM CHLORIDE 20 MEQ/15ML (10%) PO LIQD
40.0000 meq | Freq: Once | ORAL | Status: AC
  Administered 2013-09-30: 40 meq
  Filled 2013-09-30: qty 30

## 2013-09-30 MED ORDER — ASPIRIN EC 325 MG PO TBEC
325.0000 mg | DELAYED_RELEASE_TABLET | Freq: Every day | ORAL | Status: DC
  Filled 2013-09-30 (×2): qty 1

## 2013-09-30 MED ORDER — ASPIRIN 300 MG RE SUPP
300.0000 mg | Freq: Every day | RECTAL | Status: DC
  Administered 2013-09-30 – 2013-10-02 (×3): 300 mg via RECTAL
  Filled 2013-09-30 (×3): qty 1

## 2013-09-30 NOTE — Progress Notes (Signed)
SLP Cancellation Note  Patient Details Name: Tina Chambers MRN: [id number] DOB: 04-22-1927   Cancelled treatment:       Reason Eval/Treat Not Completed: Medical issues which prohibited therapy;Patient not medically ready, Intubated    Marlene Beidler, Riley Nearing 09/30/2013, 8:44 AM

## 2013-09-30 NOTE — Progress Notes (Signed)
Extubated pt placed on 2L [place]

## 2013-09-30 NOTE — Progress Notes (Signed)
PULMONARY  / CRITICAL CARE MEDICINE  Name: Tina Chambers MRN: [id number] DOB: [birth year]    ADMISSION DATE:  09/29/2013 CONSULTATION DATE:  09/29/2013  REFERRING MD :  Neurology  PRIMARY SERVICE:  Neurology  CHIEF COMPLAINT:  Acute respiratory failure  BRIEF PATIENT DESCRIPTION: 79 yo with past medical history of HTN, AF (stoped Xarelto because of bleeding ulcers) and CHF admitted 11/4 with acute CVA rewiring IR intervention. Brought to ICU intubated.  SIGNIFICANT EVENTS / STUDIES:  11/4  Admitted with acute CVA 11/4  Head CT >>> nad 11/4  IR >>> R common carotid angiogram, revascularization of R MCA with thrombectomy device 11/4  In ICU, intubated 11/5  MRI / MRA >>>  LINES / TUBES: OETT 11/4 >>> OGT 11/4 >>> Foley 11/4 >>> R fem A-line 11/4 >>> L fem A-line 11/4 >>>  CULTURES:  ANTIBIOTICS:  The patient is encephalopathic and unable to provide history, which was obtained for available medical records.  INTERVAL HISTORY:  Intermittently agitated overnight.  VITAL SIGNS: Temp:  [96.6 F (35.9 C)-99.3 F (37.4 C)] 99.3 F (37.4 C) (11/05 0700) Pulse Rate:  [42-123] 109 (11/05 0756) Resp:  [14-26] 21 (11/05 0600) BP: (94-193)/(33-100) 139/58 mmHg (11/05 0756) SpO2:  [98 %-100 %] 100 % (11/05 0756) Arterial Line BP: (103-178)/(48-78) 127/61 mmHg (11/05 0600) FiO2 (%):  [30 %-100 %] 30 % (11/05 0756) Weight:  [62.2 kg (137 lb 2 oz)-65.4 kg (144 lb 2.9 oz)] 62.2 kg (137 lb 2 oz) (11/04 1247)  HEMODYNAMICS:   VENTILATOR SETTINGS: Vent Mode:  [-] PRVC FiO2 (%):  [30 %-100 %] 30 % Set Rate:  [14 bmp] 14 bmp Vt Set:  [440 mL] 440 mL PEEP:  [5 cmH20] 5 cmH20 Plateau Pressure:  [14 cmH20-17 cmH20] 17 cmH20  INTAKE / OUTPUT: Intake/Output     11/04 0701 - 11/05 0700 11/05 0701 - 11/06 0700   I.V. (mL/kg) 2152.3 (34.6)    NG/GT 125    Total Intake(mL/kg) 2277.3 (36.6)    Urine (mL/kg/hr) 1775    Blood 50    Total Output 1825     Net +452.3            PHYSICAL EXAMINATION: General:  Mechanically ventilated, asynchronous at times Neuro:  Encephalopathic, nonfocal, cough / gag present HEENT:  PERRL, OETT Cardiovascular:  RRR, no m/r/g Lungs:  Bilateral diminished air entry, no added breath sounds Abdomen:  Soft, nontender, bowel sounds diminished Musculoskeletal:  No edema Skin:  Intact  LABS:  Recent Labs Lab 09/29/13 0849  09/29/13 0853 09/29/13 0902 09/29/13 1336 09/30/13 0545  HGB  --   --  10.1* 12.2  --  9.5*  WBC  --   --  6.3  --   --  7.7  PLT  --   --  PLATELET CLUMPS NOTED ON SMEAR, UNABLE TO ESTIMATE  --   --  111*  NA  --   --  138 139  --  142  K  --   < > 3.4* 3.1*  --  3.3*  CL  --   --  96 95*  --  105  CO2  --   --  28  --   --  26  GLUCOSE  --   --  158* 157*  --  108*  BUN  --   --  22 26*  --  14  CREATININE  --   --  0.85 1.10  --  0.68  CALCIUM  --   --  9.8  --   --  9.1  MG 1.6  --   --   --   --   --   AST  --   --  28  --   --   --   ALT  --   --  17  --   --   --   ALKPHOS  --   --  98  --   --   --   BILITOT  --   --  0.3  --   --   --   PROT  --   --  6.9  --   --   --   ALBUMIN  --   --  3.5  --   --   --   APTT  --   --  20*  --   --   --   INR  --   --  0.87  --   --   --   TROPONINI  --   --  <0.30  --   --   --   PHART  --   --   --   --  7.513*  --   PCO2ART  --   --   --   --  43.7  --   PO2ART  --   --   --   --  493.0*  --   < > = values in this interval not displayed.  Recent Labs Lab 09/29/13 1331 09/29/13 1608 09/29/13 2015 09/30/13 0038 09/30/13 0347  GLUCAP 94 134* 107* 106* 106*   CXR:  11/4 >>> ETT good position, no overt airspace disease.  ASSESSMENT / PLAN:  PULMONARY A:  Acute respiratory failure in setting of acute CVA / intervention. P:   Gaol SpO2>92, pH>7.30 Full mechanical support until MRI today SBT with intent to extubate after back form MRI Ventilator bundle Trend ABG / CXR  CARDIOVASCULAR A: NTH. PAF. Dyslipidemia. P:  BP goals per IR /  Neurology Cardene gtt Sotalol Labetalol PRN  RENAL A:  Hypokalemia. P:   Trend BMP K 40 x 1 NS@75   GASTROINTESTINAL A:  No active issues. P:   NPO as intubated Protonix for GI Px NGT  HEMATOLOGIC A:  Anemia. Thrombocytopenia. P:  Trend CBC SCDs for DVT Px  INFECTIOUS A:  No acute issues. P:   No intervention required  ENDOCRINE  A:  Hyperglycemia. P:   SSI  NEUROLOGIC A:  Acute CVA s/p thrombectomy P:   Per Neurology Goal RASS 0 to -1 Propofol gtt  I have personally obtained history, examined patient, evaluated and interpreted laboratory and imaging results, reviewed medical records, formulated assessment / plan and placed orders.  CRITICAL CARE:  The patient is critically ill with multiple organ systems failure and requires high complexity decision making for assessment and support, frequent evaluation and titration of therapies, application of advanced monitoring technologies and extensive interpretation of multiple databases. Critical Care Time devoted to patient care services described in this note is 40 minutes.   Lonia Farber, MD Pulmonary and Critical Care Medicine [facility] Pager: [phone]  09/30/2013, 8:13 AM

## 2013-09-30 NOTE — Progress Notes (Signed)
Per md ok to wean to extubate

## 2013-09-30 NOTE — Progress Notes (Signed)
1 Day Post-Op  Subjective: Code CVA 11/4 R MCA IA tpa; clot retrieval Pt still on vent Does try to communicate   Objective: Vital signs in last 24 hours: Temp:  [96.6 F (35.9 C)-99.3 F (37.4 C)] 99.3 F (37.4 C) (11/05 0700) Pulse Rate:  [42-123] 109 (11/05 0756) Resp:  [14-26] 21 (11/05 0600) BP: (94-193)/(33-100) 139/58 mmHg (11/05 0756) SpO2:  [98 %-100 %] 100 % (11/05 0756) Arterial Line BP: (103-178)/(48-78) 127/61 mmHg (11/05 0600) FiO2 (%):  [30 %-100 %] 30 % (11/05 0756) Weight:  [137 lb 2 oz (62.2 kg)-144 lb 2.9 oz (65.4 kg)] 137 lb 2 oz (62.2 kg) (11/04 1247)    Intake/Output from previous day: 11/04 0701 - 11/05 0700 In: 2277.3 [I.V.:2152.3; NG/GT:125] Out: 1825 [Urine:1775; Blood:50] Intake/Output this shift:    PE: on vent Afeb; vss Moving Rt hand some Opens eyes Rt groin sheath intact No hematoma Rt foot 2+ pulses Undergoing echocardiogram now  Lab Results:   Recent Labs  09/29/13 0853 09/29/13 0902 09/30/13 0545  WBC 6.3  --  7.7  HGB 10.1* 12.2 9.5*  HCT 32.8* 36.0 30.2*  PLT PLATELET CLUMPS NOTED ON SMEAR, UNABLE TO ESTIMATE  --  111*   BMET  Recent Labs  09/29/13 0853 09/29/13 0902 09/30/13 0545  NA 138 139 142  K 3.4* 3.1* 3.3*  CL 96 95* 105  CO2 28  --  26  GLUCOSE 158* 157* 108*  BUN 22 26* 14  CREATININE 0.85 1.10 0.68  CALCIUM 9.8  --  9.1   PT/INR  Recent Labs  09/29/13 0853  LABPROT 11.7  INR 0.87   ABG  Recent Labs  09/29/13 1336  PHART 7.513*  HCO3 35.1*    Studies/Results: Ct Head Wo Contrast  09/29/2013   CLINICAL DATA:  Post tPA. Stroke.  EXAM: CT HEAD WITHOUT CONTRAST  TECHNIQUE: Contiguous axial images were obtained from the base of the skull through the vertex without intravenous contrast.  COMPARISON:  09/29/2013  FINDINGS: No area of hemorrhage. No acute infarction. No hydrocephalus. There is atrophy and chronic small vessel disease changes.  IMPRESSION: No hemorrhage or acute infarction.    Electronically Signed   By: Charlett Nose M.D.   On: 09/29/2013 12:46   Ct Head Wo Contrast  09/29/2013   CLINICAL DATA:  Code stroke. Right gaze preference.  EXAM: CT HEAD WITHOUT CONTRAST  TECHNIQUE: Contiguous axial images were obtained from the base of the skull through the vertex without intravenous contrast.  COMPARISON:  07/14/2013  FINDINGS: There is atrophy and chronic small vessel disease changes. Small old lacunar infarct in the left basal ganglia, stable. No acute intracranial abnormality. Specifically, no hemorrhage, hydrocephalus, mass lesion, acute infarction, or significant intracranial injury. No acute calvarial abnormality.  IMPRESSION: No acute intracranial abnormality.  Critical Value/emergent results were called by telephone at the time of interpretation on 09/29/2013 at 9:07 AM to Dr.Kirkpatrick, who verbally acknowledged these results.   Electronically Signed   By: Charlett Nose M.D.   On: 09/29/2013 09:07   Dg Chest Port 1 View  09/29/2013   CLINICAL DATA:  Evaluate endotracheal tube.  EXAM: PORTABLE CHEST - 1 VIEW  COMPARISON:  Prior radiograph from 07/17/2013  FINDINGS: The tip of the endotracheal tube is located 3.7 cm above the carinal. Enteric tube courses into the abdomen.  Cardiac and mediastinal silhouettes are within normal limits.  Lungs are normally inflated. No focal infiltrate is identified. Mild bibasilar atelectasis is present. There is no  pulmonary edema or pleural effusion. No pneumothorax.  Osseous structures are within normal limits.  IMPRESSION: 1. Tip of endotracheal tube 3.7 cm above the carina. 2. Mild bibasilar atelectasis with no radiographic evidence of acute cardiopulmonary process.   Electronically Signed   By: Rise Mu M.D.   On: 09/29/2013 14:08   Dg Abd Portable 1v  09/29/2013   CLINICAL DATA:  OG T placement  EXAM: PORTABLE ABDOMEN - 1 VIEW  COMPARISON:  Prior abdominal radiographs 07/14/2013  FINDINGS: An orogastric tube is present. The tip  of the tube is within the gastric fundus in apparently good position. The bowel gas pattern is unremarkable and not obstructed. A thermoister projects over the anatomic pelvis. Multilevel degenerative disc disease with dextroconvex scoliosis centered at L1-L2. Probable L1 and L4 compression fractures.  IMPRESSION: The tip of the orogastric tube projects over the gastric fundus.   Electronically Signed   By: Malachy Moan M.D.   On: 09/29/2013 14:05    Anti-infectives: Anti-infectives   None      Assessment/Plan: s/p Procedure(s): RADIOLOGY WITH ANESTHESIA (N/A)  CVA 11/4 IA tpa and clot retrieval 11/4 On vent Does open eye and moving rt hand Undergoing echo now Sheath pull orders in chart   LOS: 1 day    Jeannemarie Sawaya A 09/30/2013

## 2013-09-30 NOTE — Progress Notes (Signed)
Stroke Team Progress Note  HISTORY Tina Chambers is a 79 y.o. female with PMH of HTN, hyperlipidemia, CHF, hypothyroidism, A. Fib (currently not on blood thinner), recent Gi bleeding secondary to peptic ulcer disease, who presents with left-sided weakness.  Patient is a resident of assisted living facility ([address] She has been doing fine until this morning 09/29/2013. She was walking, went to sit down at 8:00 AM. The workers noticed that she wasn't moving or interacting as normal. She was found to have left sided weakness and facial droopy. She could not move her left arm and leg initially. EMS was called and took patient to ED. She was found to have right sided gaze, left facial droopy and left arm weakness in ED. She moves both her legs well. She is interactive for interview and answers questions with slurry speech. Stat CT head has no intracranial bleeding. Her blood pressure is 142/66, heart rate 90, oxygen saturation 94% on room air. Of note, patient has hx GI bleeding secondary to peptic ulcer disease. Most recent GI bleeding was proximately 2 weeks ago. She has a. fib, and was blood thinner before, which was discontinued because of recent GI bleeding.   Patient was not a TPA candidate secondary to recent GIB. Taken to intervention where an angio showed M1 occlusion which was opened with IA tpa and solitaire. She was admitted to the neuro ICU for further evaluation and treatment.  SUBJECTIVE Her 3 kids are at the bedside.  Overall  her condition is critical. They are concerned about cardiac issues and previous advice. They would like a cardiology consult when appropriate. Pt in SNF from Aug until last week when she was moved to ALF at [facility]  OBJECTIVE Most recent Vital Signs: Filed Vitals:   09/30/13 0500 09/30/13 0600 09/30/13 0700 09/30/13 0756  BP: 110/54 118/51  139/58  Pulse: 120 104  109  Temp:   99.3 F (37.4 C)   TempSrc:   Axillary   Resp: 14 21     Height:      Weight:      SpO2: 100% 100%  100%   CBG (last 3)   Recent Labs  09/30/13 0038 09/30/13 0347 09/30/13 0724  GLUCAP 106* 106* 103*    IV Fluid Intake:   . sodium chloride    . niCARDipine 15 mg/hr (09/30/13 0930)  . propofol 15.005 mcg/kg/min (09/30/13 0600)    MEDICATIONS  . alteplase  59 mg Intravenous Once  . antiseptic oral rinse  15 mL Mouth Rinse QID  . chlorhexidine  15 mL Mouth Rinse BID  . influenza vac split quadrivalent PF  0.5 mL Intramuscular Tomorrow-1000  . insulin aspart  0-15 Units Subcutaneous Q4H  . pantoprazole (PROTONIX) IV  40 mg Intravenous QHS  . pneumococcal 23 valent vaccine  0.5 mL Intramuscular Tomorrow-1000  . potassium chloride  10 mEq Intravenous Once  . sodium chloride  1,000 mL Intravenous Once  . sotalol  80 mg Oral Q12H   PRN:  acetaminophen, acetaminophen, acetaminophen, acetaminophen, labetalol, ondansetron (ZOFRAN) IV  Diet:  NPO  Activity:  Bedrest DVT Prophylaxis:  SCDs   CLINICALLY SIGNIFICANT STUDIES Basic Metabolic Panel:  Recent Labs Lab 09/29/13 0849  09/29/13 0853 09/29/13 0902 09/30/13 0545  NA  --   < > 138 139 142  K  --   < > 3.4* 3.1* 3.3*  CL  --   < > 96 95* 105  CO2  --   --  28  --  26  GLUCOSE  --   < > 158* 157* 108*  BUN  --   < > 22 26* 14  CREATININE  --   < > 0.85 1.10 0.68  CALCIUM  --   --  9.8  --  9.1  MG 1.6  --   --   --   --   < > = values in this interval not displayed. Liver Function Tests:   Recent Labs Lab 09/29/13 0853  AST 28  ALT 17  ALKPHOS 98  BILITOT 0.3  PROT 6.9  ALBUMIN 3.5   CBC:   Recent Labs Lab 09/29/13 0853 09/29/13 0902 09/30/13 0545  WBC 6.3  --  7.7  NEUTROABS 3.6  --  5.3  HGB 10.1* 12.2 9.5*  HCT 32.8* 36.0 30.2*  MCV 80.2  --  80.7  PLT PLATELET CLUMPS NOTED ON SMEAR, UNABLE TO ESTIMATE  --  111*   Coagulation:   Recent Labs Lab 09/29/13 0853  LABPROT 11.7  INR 0.87   Cardiac Enzymes:   Recent Labs Lab 09/29/13 0853   TROPONINI <0.30   Urinalysis:   Recent Labs Lab 09/29/13 0911  COLORURINE YELLOW  LABSPEC 1.013  PHURINE 6.5  GLUCOSEU NEGATIVE  HGBUR NEGATIVE  BILIRUBINUR NEGATIVE  KETONESUR NEGATIVE  PROTEINUR NEGATIVE  UROBILINOGEN 0.2  NITRITE NEGATIVE  LEUKOCYTESUR NEGATIVE   Lipid Panel    Component Value Date/Time   CHOL 152 09/30/2013 0545   TRIG 101 09/30/2013 0545   HDL 52 09/30/2013 0545   CHOLHDL 2.9 09/30/2013 0545   VLDL 20 09/30/2013 0545   LDLCALC 80 09/30/2013 0545   HgbA1C  No results found for this basename: HGBA1C    Urine Drug Screen:     Component Value Date/Time   LABOPIA POSITIVE* 09/29/2013 0911   COCAINSCRNUR NONE DETECTED 09/29/2013 0911   LABBENZ NONE DETECTED 09/29/2013 0911   AMPHETMU NONE DETECTED 09/29/2013 0911   THCU NONE DETECTED 09/29/2013 0911   LABBARB NONE DETECTED 09/29/2013 0911    Alcohol Level:   Recent Labs Lab 09/29/13 0853  ETH <11   CT of the brain   09/29/2013   No hemorrhage or acute infarction.    09/29/2013    No acute intracranial abnormality.   Cerebral angiogram  09/29/2013 S/P RT Common carotid arteriogram followed by complete revascularization(TICI3) of occluded Rt MCA prox using 5mg  of IA tpa and one pass with [employer] Solitaire FR thrombectomy device .  MRI of the brain    MRA of the brain    2D Echocardiogram    Carotid Doppler  See angio  CXR  09/29/2013   1. Tip of endotracheal tube 3.7 cm above the carina. 2. Mild bibasilar atelectasis with no radiographic evidence of acute cardiopulmonary process.    EKG  Sinus rhythm. Multiple premature complexes, vent & supraven. Minimal ST depression, inferior leads. Prolonged QT interval. Baseline wander in lead(s) V2  Therapy Recommendations   Physical Exam   Elderly Caucasian lady who is intubated.Awake alert. Afebrile. Head is nontraumatic. Neck is supple without bruit.  l. Cardiac exam no murmur or gallop. Lungs are clear to auscultation. Distal pulses are well  felt. Neurological Exam ;  Drowsy but arouses easily and follows simple commands. Right gaze preference but can follow gaze to left till midline only. Does not blink to threat on left. Left lower face weak. Cough and gag present. Spontaneous antigravity movements on right and withdraws mildly to pain on left but  has hemiparesis. Plantars mute bilaterally.  ASSESSMENT Tina Chambers is a 79 y.o. female presenting with left sided weakness. Taken to intervention where an angio showed M1 occlusion which was opened with IA tpa and solitaire. Sheath remains intact.  MRI Imaging pending. Suspect right brain stroke. Infarct felt to be embolic secondary to known atrial fibrillation.  On no antithrombotics given hx GIB prior to admission (was on xarelto prior to GIB). Now on no antithrombotics for secondary stroke prevention as within 24 h of TPA. Patient with resultant VDRF, left hemiparesis, dysphagia. Work up underway.  atrial fibrillation, no on anticoagulation due to recent GIB 2 weeks ago secondary to PUD. Has seen Dr. Beverely Pace in [place] in the past. Now that she lives in [place], they are interested in getting a new cardiologist. Hypertension Hyperlipidemia, LDL 80, on Lipitor 10 PTA, now lipitor on hold as NPO, goal LDL < 100 (< 70 for diabetics) CHF - lasix on hold to allow permissive hypertension Aug 2014 fractured sacrum, in SNF until last week when she was moved to ALF at [facility] day # 1  TREATMENT/PLAN  D/c sheath  Ok to extubate  MRI, MRA today if can tolerate. Do not sedate for MRI. If cannot tolerate, will do CT.  F/u 2D, HgbA1c  Check swallow  Resume lipitor, home meds once able to swallow  Add aspirin 325 mg orally every day and or aspirin 300 mg suppository for secondary stroke prevention.  Consider trying eliquis  Cardiology consult in [place] - IP or at discharge, depending on pt need  Annie Main, MSN, RN, ANVP-BC, ANP-BC, GNP-BC [facility] Pager: [phone] 09/30/2013 9:39 AM This patient is critically ill and at significant risk of neurological worsening, death and care requires constant monitoring of vital signs, hemodynamics,respiratory and cardiac monitoring,review of multiple databases, neurological assessment, discussion with family, other specialists and medical decision making of high complexity. I spent 30 minutes of neurocritical care time  in the care of  this patient. I have personally obtained a history, examined the patient, evaluated imaging results, and formulated the assessment and plan of care. I agree with the above. Delia Heady, MD

## 2013-09-30 NOTE — Progress Notes (Signed)
Echocardiogram 2D Echocardiogram has been performed.  Betsie Peckman 09/30/2013, 9:48 AM

## 2013-09-30 NOTE — Progress Notes (Signed)
PT Cancellation Note  Patient Details Name: Tina Chambers MRN: [id number] DOB: Nov 11, 1927   Cancelled Treatment:    Reason Eval/Treat Not Completed: Patient not medically ready.  Will f/u tomorrow.     Sunny Schlein, [place] [phone] 09/30/2013, 8:21 AM

## 2013-09-30 NOTE — Progress Notes (Signed)
ETT secure. Breakdown noted on pts lips. RN aware. Moved ETT to pts center.

## 2013-09-30 NOTE — Progress Notes (Addendum)
Dr. Marchelle Gearing called and made aware that patient did not pass swallow screen but has sotalol ordered P.O.  Ordered to insert panda tube. Also told MD about increased irregular heart rate. Will give sotalol and continue to monitor. [name]

## 2013-09-30 NOTE — Progress Notes (Signed)
ETT secure. Breakdown noted on pts lips. Moved ETT to pts left

## 2013-09-30 NOTE — Procedures (Addendum)
Extubation Procedure Note  Patient Details:   Name: Tina Chambers DOB: Sep 05, 1927 MRN: [id number]   Airway Documentation:  Airway 7 mm (Active)  Secured at (cm) 21 cm 09/30/2013  3:56 PM  Measured From Lips 09/30/2013  3:56 PM  Secured Location Center 09/30/2013  3:56 PM  Secured By [employer] 09/30/2013  3:56 PM  Tube Holder Repositioned Yes 09/30/2013  3:56 PM  Cuff Pressure (cm H2O) 22 cm H2O 09/30/2013  7:56 AM  Site Condition Dry 09/30/2013  3:56 PM    Evaluation  O2 sats: 100  Complications: No apparent complications Patient did tolerate procedure well. Bilateral Breath Sounds: Clear Suctioning: Airway;Oral Yes. Pt is awake and can follow commands. Pt has positive cuff leak. Deep oral sxn done. Pt has good cough/gag. Extubated pt to 2L [place]. Pt tol well.   Kandis Nab 09/30/2013, 4:55 PM

## 2013-09-30 NOTE — Progress Notes (Signed)
Right Femoral 9 fr sheath pulled at 10:15. Hemostasis achieved at 10:45 with V-Pad. No complications. 10 lb sandbag placed on Right groin. Left femoral 4 fr sheath pulled at 10:50. Hemostasis achieved with V-Pad at 11:15. No complications. 10 lb sandbag placed on Left groin.   Diamond Martucci / Jani Files.

## 2013-09-30 NOTE — Progress Notes (Signed)
patinet needs her sotalol. Failed swallow.  Plan Place panda   Dr. Kalman Shan, M.D., [facility].C.P Pulmonary and Critical Care Medicine Staff Physician [facility]  Pulmonary and Critical Care Pager: [phone], If no answer or between  15:00h - 7:00h: call [phone]  09/30/2013 10:15 PM

## 2013-09-30 NOTE — Progress Notes (Signed)
Pt pulling at ett tube with right hand. Mitt is already applied and patient is still attempting to grip ett. E link md called and made aware of problem. New orders given. Will continue to monitor. [name]

## 2013-09-30 NOTE — Progress Notes (Signed)
UR completed.  Jadan Hinojos, RN BSN [facility] CCM Trauma/Neuro ICU Case Manager [phone]  

## 2013-09-30 NOTE — Progress Notes (Addendum)
E-Link MD called with concern of increased heart rate at times that returns to regular rate with no intervention.  Likely due to cardene gtt and will monitor. Told to consult with IR MD.  Will continue to monitor. [name]

## 2013-09-30 NOTE — Progress Notes (Signed)
OT Cancellation Note  Patient Details Name: Tina Chambers MRN: [id number] DOB: May 05, 1927   Cancelled Treatment:    Reason Eval/Treat Not Completed: Medical issues which prohibited therapy Nursing asked to hold today. Will attempt tomorrow. [facility] Jarel Cuadra, OTR/L  [phone] 09/30/2013 09/30/2013, 11:54 AM

## 2013-09-30 NOTE — Progress Notes (Addendum)
Dr. Corliss Skains called and made aware of patients increased heart rate going from sinus rhythm to A. Fib.  Told to monitor patient and have neurology MD make changes during rounding. Blood pressure within order parameters on cardene gtt.  Will continue to monitor. [name]

## 2013-10-01 ENCOUNTER — Inpatient Hospital Stay (HOSPITAL_COMMUNITY): Payer: Medicare Other

## 2013-10-01 DIAGNOSIS — I6340 Cerebral infarction due to embolism of unspecified cerebral artery: Secondary | ICD-10-CM

## 2013-10-01 LAB — GLUCOSE, CAPILLARY
Glucose-Capillary: 98 mg/dL (ref 70–99)
Glucose-Capillary: 86 mg/dL (ref 70–99)
Glucose-Capillary: 116 mg/dL — ABNORMAL HIGH (ref 70–99)

## 2013-10-01 MED ORDER — RESOURCE THICKENUP CLEAR PO POWD
ORAL | Status: DC | PRN
  Filled 2013-10-01: qty 125

## 2013-10-01 MED ORDER — POTASSIUM CHLORIDE 20 MEQ/15ML (10%) PO LIQD
40.0000 meq | Freq: Once | ORAL | Status: AC
  Administered 2013-10-01: 40 meq
  Filled 2013-10-01: qty 30

## 2013-10-01 MED ORDER — PANTOPRAZOLE SODIUM 40 MG PO PACK
40.0000 mg | PACK | Freq: Every day | ORAL | Status: DC
  Administered 2013-10-01 – 2013-10-02 (×2): 40 mg
  Filled 2013-10-01 (×2): qty 20

## 2013-10-01 MED ORDER — BIOTENE DRY MOUTH MT LIQD
15.0000 mL | Freq: Two times a day (BID) | OROMUCOSAL | Status: DC
  Administered 2013-10-01 – 2013-10-02 (×2): 15 mL via OROMUCOSAL

## 2013-10-01 MED ORDER — PHENOL 1.4 % MT LIQD: 1.0000 | OROMUCOSAL | Status: DC | PRN

## 2013-10-01 NOTE — Evaluation (Signed)
Clinical/Bedside Swallow Evaluation Patient Details  Name: Tina Chambers MRN: [id number] Date of Birth: 06-18-1927  Today's Date: 10/01/2013 Time: 0910-0940 SLP Time Calculation (min): 30 min  Past Medical History:  Past Medical History  Diagnosis Date  . Hypertension   . Atrial fibrillation   . CHF (congestive heart failure)   . Hypothyroid   . Hyperlipidemia    Past Surgical History: History reviewed. No pertinent past surgical history. HPI:  Tina Chambers is a 79 y.o. female with PMH of HTN, hyperlipidemia, CHF, hypothyroidism, A. Fib (currently not on blood thinner), recent Gi bleeding secondary to peptic ulcer disease, who presents with left-sided weakness. Patient is a resident of assisted living facility ([address] She has been doing fine until this morning 09/29/2013. She was walking, went to sit down at 8:00 AM. The workers noticed that she wasn't moving or interacting as normal. She was found to have left sided weakness and facial droopy. She could not move her left arm and leg initially. Taken to intervention where an angio showed M1 occlusion which was opened with IA tpa and solitaire. Sheath d/c'd and pt extubated on 11/5. Now with NG tube due to need for meds.CT shows evolving acute infarct on the right particularly involving the parietal lobe and temporal lobe.    Assessment / Plan / Recommendation Clinical Impression  Pt presents with multiple risk factors for dysphagia and decreased airway protection though she does not demosntrate any overt sign of aspiration at bedside. She has a history of 1-2 day intubation with slightly hoarse vocal quality putting pt at risk for decreased sensation, likely to improve rapidly. She also has overt left oral weakenss with left sulci residuals with puree post swallow. Swallow with water subjectively appears only slightly delayed though premature spillage of oral bolus probable.   Given that pt has alternate nutrition  in place already, suggest a trial today of nectar and puree under RN supervision with instructions to discontinue diet with any signs of intolerance. SLP will f/u tomorrow for possible need for MBS vs pulling NG and progressing with diet. Discussed precuations with RN and family, agree with plan.     Aspiration Risk  Moderate    Diet Recommendation Dysphagia 1 (Puree);Nectar-thick liquid   Liquid Administration via: Cup;Straw Medication Administration: Whole meds with puree Supervision: Full supervision/cueing for compensatory strategies;Staff to assist with self feeding Compensations: Slow rate;Small sips/bites;Check for pocketing;Check for anterior loss Postural Changes and/or Swallow Maneuvers: Out of bed for meals;Seated upright 90 degrees    Other  Recommendations Oral Care Recommendations: Oral care before and after PO Other Recommendations: Order thickener from pharmacy;Have oral suction available   Follow Up Recommendations  Inpatient Rehab    Frequency and Duration min 2x/week  2 weeks   Pertinent Vitals/Pain NA    SLP Swallow Goals     Swallow Study Prior Functional Status       General HPI: Tina Chambers is a 79 y.o. female with PMH of HTN, hyperlipidemia, CHF, hypothyroidism, A. Fib (currently not on blood thinner), recent Gi bleeding secondary to peptic ulcer disease, who presents with left-sided weakness. Patient is a resident of assisted living facility ([address] She has been doing fine until this morning 09/29/2013. She was walking, went to sit down at 8:00 AM. The workers noticed that she wasn't moving or interacting as normal. She was found to have left sided weakness and facial droopy. She could not move her left arm and leg  initially. Taken to intervention where an angio showed M1 occlusion which was opened with IA tpa and solitaire. Sheath d/c'd and pt extubated on 11/5. Now with NG tube due to need for meds.CT shows evolving acute infarct on the  right particularly involving the parietal lobe and temporal lobe.  Type of Study: Bedside swallow evaluation Previous Swallow Assessment: none Diet Prior to this Study: NPO Temperature Spikes Noted: Yes Respiratory Status: Nasal cannula History of Recent Intubation: Yes Length of Intubations (days): 2 days Date extubated: 09/30/13 Behavior/Cognition: Alert;Cooperative;Requires cueing;Confused Oral Cavity - Dentition: Missing dentition Self-Feeding Abilities: Needs assist Patient Positioning: Upright in bed Baseline Vocal Quality: Hoarse Volitional Cough: Strong Volitional Swallow: Able to elicit    Oral/Motor/Sensory Function Overall Oral Motor/Sensory Function: Impaired Labial ROM: Reduced left Labial Symmetry: Abnormal symmetry left Labial Strength: Reduced Labial Sensation: Reduced Lingual ROM: Reduced left Lingual Symmetry: Abnormal symmetry left Lingual Strength: Reduced Lingual Sensation: Reduced Facial ROM: Within Functional Limits Facial Symmetry: Within Functional Limits Facial Strength: Within Functional Limits Facial Sensation: Within Functional Limits Velum: Within Functional Limits Mandible: Within Functional Limits   Ice Chips Ice chips: Impaired Presentation: Spoon Oral Phase Functional Implications: Prolonged oral transit Pharyngeal Phase Impairments: Suspected delayed Swallow   Thin Liquid Thin Liquid: Impaired Presentation: Cup;Straw;Self Fed Oral Phase Impairments: Reduced labial seal Oral Phase Functional Implications: Right anterior spillage;Left anterior spillage Pharyngeal  Phase Impairments: Suspected delayed Swallow    Nectar Thick Nectar Thick Liquid: Not tested   Honey Thick Honey Thick Liquid: Not tested   Puree Puree: Impaired Presentation: Spoon Oral Phase Impairments: Reduced labial seal;Reduced lingual movement/coordination;Impaired anterior to posterior transit Oral Phase Functional Implications: Left lateral sulci pocketing;Prolonged  oral transit;Oral residue   Solid   GO    Solid: Not tested      Harlon Ditty, MA CCC-SLP [phone]  Claudine Mouton 10/01/2013,10:03 AM

## 2013-10-01 NOTE — Consult Note (Signed)
Physical Medicine and Rehabilitation Consult  Reason for Consult: Left sided weakness, facial droop, slurred speech Referring Physician: Dr. Pearlean Brownie   HPI: Tina Chambers is a 79 y.o. female with history of HTN, recent pelvic fracture,  A Fib--off blood thinners due to GIB (most recent ~ 2 weeks ago), CHF who was admitted on 09/29/13 past being found with facial droop, right gaze preference and left sided weakness. CCT without evidence of bleed and patient underwent cerebral angiogram with complete revascularization of occluded R-MCA with IA tpa and thrombectomy with Solitaire device. Follow up CCT with evolving acute infarct R-MCA territory involving parietal and temporal lobe. 2D echo with EF 50-55% and no wall abnormality, grade 1 diastolic dysfunction. Carotid dopplers with 40-59% R-ICA stenosis and 1- 39% L-ICA stenosis. Extubated on 11/05 and started on D 1, nectar thick liquids today. Patient continues with left facial weakness, MD, ST recommending CIR.   Patient is laying in bed. Responds to questions. Starts talking to another person who is not in the room. Review of Systems  Unable to perform ROS: mental status change    Past Medical History  Diagnosis Date  . Hypertension   . Atrial fibrillation   . CHF (congestive heart failure)   . Hypothyroid   . Hyperlipidemia    History reviewed. No pertinent past surgical history.  History reviewed. No pertinent family history.  Social History:  Recently moved to [facility].- Was in SNF till a week PTA ( Aug-a week PTA) for pelvic fracture. Per  report she has never smoked. She does not have any smokeless tobacco history on file. Per reports that she does not drink alcohol or use illicit drugs.   Allergies  Allergen Reactions  . Phenergan [Promethazine Hcl]     From [facility]   Medications Prior to Admission  Medication Sig Dispense Refill  . atorvastatin (LIPITOR) 10 MG tablet Take 10 mg by mouth daily.      Marland Kitchen diltiazem  (DILACOR XR) 180 MG 24 hr capsule Take 180 mg by mouth daily.      . ferrous sulfate 325 (65 FE) MG tablet Take 325 mg by mouth 2 (two) times daily with a meal.      . FLUoxetine (PROZAC) 10 MG capsule Take 10 mg by mouth daily.      Marland Kitchen HYDROcodone-acetaminophen (NORCO/VICODIN) 5-325 MG per tablet Take 1 tablet by mouth daily.      Marland Kitchen HYDROcodone-acetaminophen (NORCO/VICODIN) 5-325 MG per tablet Take 1 tablet by mouth every 4 (four) hours as needed for moderate pain.      Marland Kitchen levothyroxine (SYNTHROID, LEVOTHROID) 75 MCG tablet Take 75 mcg by mouth daily before breakfast.      . omeprazole (PRILOSEC) 20 MG capsule Take 20 mg by mouth daily.      Marland Kitchen OVER THE COUNTER MEDICATION Take 2 capsules by mouth 2 (two) times daily. "Probiotic Intestinal 50-500-100"      . solifenacin (VESICARE) 10 MG tablet Take 10 mg by mouth daily.      . sotalol (BETAPACE) 80 MG tablet Take 80 mg by mouth 2 (two) times daily.      . temazepam (RESTORIL) 15 MG capsule Take 15 mg by mouth at bedtime as needed for sleep.      Marland Kitchen torsemide (DEMADEX) 10 MG tablet Take 10 mg by mouth daily.      . traMADol (ULTRAM) 50 MG tablet Take by mouth 3 (three) times daily as needed for moderate pain.  Home: Home Living Family/patient expects to be discharged to:: Skilled nursing facility Living Arrangements: Alone  Functional History:   Functional Status:  Mobility:          ADL:    Cognition: Cognition Orientation Level: Oriented to person;Disoriented to place;Disoriented to time;Disoriented to situation    Blood pressure 143/70, pulse 97, temperature 99.9 F (37.7 C), temperature source Axillary, resp. rate 24, height 5\' 4"  (1.626 m), weight 62.2 kg (137 lb 2 oz), SpO2 94.00%. Physical Exam  Nursing note and vitals reviewed. Constitutional: She appears well-developed and well-nourished.  HENT:  Head: Normocephalic and atraumatic.  Eyes: Conjunctivae and EOM are normal. Pupils are equal, round, and reactive to  light.  Neck: Normal range of motion. Neck supple.  Cardiovascular: Normal rate, regular rhythm and normal heart sounds.   No murmur heard. Respiratory: Effort normal and breath sounds normal. No respiratory distress. She has no wheezes. She has no rales.  GI: Soft. Bowel sounds are normal. She exhibits no distension. There is no tenderness. There is no rebound.  Neurological: She is alert. A sensory deficit is present. No cranial nerve deficit. She exhibits abnormal muscle tone. She displays no seizure activity. Gait abnormal.  Reflex Scores:      Tricep reflexes are 3+ on the left side.      Bicep reflexes are 3+ on the left side.      Brachioradialis reflexes are 3+ on the left side.      Patellar reflexes are 3+ on the left side.      Achilles reflexes are 3+ on the left side. Flexor withdrawal spasticity left lower extremity Motor strength is 0/5 left upper extremity trace hip extension left lower extremity Right upper extremity 4+/5 in the deltoid, bicep, tricep, grip right lower extremity 4/5 in the hip flexor knee extensor ankle dorsiflexor plantar flexor Sensation is absent to light touch in the left upper extremity but withdraws with pinch Left lower extremity withdrawal with pinch, absent light touch Right upper and right lower light touch is intact Patient appears to have a field cut on the left side but difficult to test. She does turn her head to look at objects on the left side when cued    Results for orders placed during the hospital encounter of 09/29/13 (from the past 24 hour(s))  GLUCOSE, CAPILLARY     Status: Abnormal   Collection Time    09/30/13 12:44 PM      Result Value Range   Glucose-Capillary 109 (*) 70 - 99 mg/dL  GLUCOSE, CAPILLARY     Status: None   Collection Time    09/30/13  4:27 PM      Result Value Range   Glucose-Capillary 86  70 - 99 mg/dL   Comment 1 Notify RN     Comment 2 Documented in Chart    GLUCOSE, CAPILLARY     Status: None    Collection Time    09/30/13  8:32 PM      Result Value Range   Glucose-Capillary 99  70 - 99 mg/dL   Comment 1 Notify RN     Comment 2 Documented in Chart    GLUCOSE, CAPILLARY     Status: Abnormal   Collection Time    10/01/13 12:22 AM      Result Value Range   Glucose-Capillary 111 (*) 70 - 99 mg/dL  GLUCOSE, CAPILLARY     Status: None   Collection Time    10/01/13  3:59 AM  Result Value Range   Glucose-Capillary 98  70 - 99 mg/dL  GLUCOSE, CAPILLARY     Status: None   Collection Time    10/01/13  7:35 AM      Result Value Range   Glucose-Capillary 99  70 - 99 mg/dL   Ct Head Wo Contrast  09/30/2013   CLINICAL DATA:  Status post interventional case, followup stroke  EXAM: CT HEAD WITHOUT CONTRAST  TECHNIQUE: Contiguous axial images were obtained from the base of the skull through the vertex without intravenous contrast.  COMPARISON:  09/29/2013  FINDINGS: No evidence of hemorrhage or extra-axial fluid. There is decreased attenuation in the white matter of the middle cerebral artery territory on the right, particularly involving the parietal lobe and temporal lobe. There is also decreased sulcation, with mass effect on the right lateral ventricle. No evidence of midline shift. Stable lacunar infarct right thalamus.  IMPRESSION: Evolving acute infarct on the right. No hemorrhage.   Electronically Signed   By: Esperanza Heir M.D.   On: 09/30/2013 19:05   Ct Head Wo Contrast  09/29/2013   CLINICAL DATA:  Post tPA. Stroke.  EXAM: CT HEAD WITHOUT CONTRAST  TECHNIQUE: Contiguous axial images were obtained from the base of the skull through the vertex without intravenous contrast.  COMPARISON:  09/29/2013  FINDINGS: No area of hemorrhage. No acute infarction. No hydrocephalus. There is atrophy and chronic small vessel disease changes.  IMPRESSION: No hemorrhage or acute infarction.   Electronically Signed   By: Charlett Nose M.D.   On: 09/29/2013 12:46   Dg Chest Port 1 View  09/29/2013    CLINICAL DATA:  Evaluate endotracheal tube.  EXAM: PORTABLE CHEST - 1 VIEW  COMPARISON:  Prior radiograph from 07/17/2013  FINDINGS: The tip of the endotracheal tube is located 3.7 cm above the carinal. Enteric tube courses into the abdomen.  Cardiac and mediastinal silhouettes are within normal limits.  Lungs are normally inflated. No focal infiltrate is identified. Mild bibasilar atelectasis is present. There is no pulmonary edema or pleural effusion. No pneumothorax.  Osseous structures are within normal limits.  IMPRESSION: 1. Tip of endotracheal tube 3.7 cm above the carina. 2. Mild bibasilar atelectasis with no radiographic evidence of acute cardiopulmonary process.   Electronically Signed   By: Rise Mu M.D.   On: 09/29/2013 14:08   Dg Abd Portable 1v  09/30/2013   CLINICAL DATA:  Feeding tube placement  EXAM: PORTABLE ABDOMEN - 1 VIEW  COMPARISON:  Prior radiograph from 09/29/2013.  FINDINGS: The tip of a weighted [place] feeding tube overlies the gastric fundus with tip projecting cephalad.  The visualized bowel gas pattern is nonobstructive. No acute abnormality identified within the abdomen and pelvis.  Dextroscoliosis with severe multilevel degenerative disc disease again noted within the lumbar spine.  IMPRESSION: Tip of the [place] feeding tube overlying the gastric fundus and projecting cephalad.   Electronically Signed   By: Rise Mu M.D.   On: 09/30/2013 23:31   Dg Abd Portable 1v  09/29/2013   CLINICAL DATA:  OG T placement  EXAM: PORTABLE ABDOMEN - 1 VIEW  COMPARISON:  Prior abdominal radiographs 07/14/2013  FINDINGS: An orogastric tube is present. The tip of the tube is within the gastric fundus in apparently good position. The bowel gas pattern is unremarkable and not obstructed. A thermoister projects over the anatomic pelvis. Multilevel degenerative disc disease with dextroconvex scoliosis centered at L1-L2. Probable L1 and L4 compression fractures.  IMPRESSION: The  tip  of the orogastric tube projects over the gastric fundus.   Electronically Signed   By: Malachy Moan M.D.   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 Does the need for close, 24 hr/day medical supervision in concert with the patient's rehab needs make it unreasonable for this patient to be served in a less intensive setting? Yes 2. Co-Morbidities requiring supervision/potential complications: Recent respiratory failure, severe swallowing impairment  with risk for aspiration pneumonia 3. Due to bladder management, bowel management, safety, skin/wound care, disease management, medication administration, pain management and patient education, does the patient require 24 hr/day rehab nursing? Yes 4. Does the patient require coordinated care of a physician, rehab nurse, PT (1-2 hrs/day, 5 days/week), OT (1-2 hrs/day, 5 days/week) and SLP (0.5-1 hrs/day, 5 days/week) to address physical and functional deficits in the context of the above medical diagnosis(es)? Yes Addressing deficits in the following areas: balance, endurance, locomotion, strength, transferring, bowel/bladder control, bathing, dressing, feeding, grooming and toileting 5. Can the patient actively participate in an intensive therapy program of at least 3 hrs of therapy per day at least 5 days per week? Yes 6. The potential for patient to make measurable gains while on inpatient rehab is good 7. Anticipated functional outcomes upon discharge from inpatient rehab are min assist mobility with PT, min assist ADLs with OT, safe per os intake with adequate calories and fluids with SLP. 8. Estimated rehab length of stay to reach the above functional goals is: 22-29 days 9. Does the patient have adequate social supports to accommodate these discharge functional goals? Yes 10. Anticipated D/C setting: Home 11. Anticipated post D/C  treatments: HH therapy 12. Overall Rehab/Functional Prognosis: good  RECOMMENDATIONS: This patient's condition is appropriate for continued rehabilitative care in the following setting: CIR Patient has agreed to participate in recommended program. Potentially Note that insurance prior authorization may be required for reimbursement for recommended care.  Comment:     10/01/2013

## 2013-10-01 NOTE — Progress Notes (Signed)
Stroke Team Progress Note  HISTORY Kenneth Lax Harlan is a 79 y.o. female with PMH of HTN, hyperlipidemia, CHF, hypothyroidism, A. Fib (currently not on blood thinner), recent Gi bleeding secondary to peptic ulcer disease, who presents with left-sided weakness.  Patient is a resident of assisted living facility ([address] She has been doing fine until this morning 09/29/2013. She was walking, went to sit down at 8:00 AM. The workers noticed that she wasn't moving or interacting as normal. She was found to have left sided weakness and facial droopy. She could not move her left arm and leg initially. EMS was called and took patient to ED. She was found to have right sided gaze, left facial droopy and left arm weakness in ED. She moves both her legs well. She is interactive for interview and answers questions with slurry speech. Stat CT head has no intracranial bleeding. Her blood pressure is 142/66, heart rate 90, oxygen saturation 94% on room air. Of note, patient has hx GI bleeding secondary to peptic ulcer disease. Most recent GI bleeding was proximately 2 weeks ago. She has a. fib, and was blood thinner before, which was discontinued because of recent GI bleeding.   Patient was not a TPA candidate secondary to recent GIB. Taken to intervention where an angio showed M1 occlusion which was opened with IA tpa and solitaire. She was admitted to the neuro ICU for further evaluation and treatment.  SUBJECTIVE Her 3 kids are at the bedside.  Overall  her condition is critical. They are concerned about cardiac issues and previous advice. They would like a cardiology consult when appropriate. Pt in SNF from Aug until last week when she was moved to ALF at [facility]  OBJECTIVE Most recent Vital Signs: Filed Vitals:   10/01/13 0400 10/01/13 0500 10/01/13 0600 10/01/13 0700  BP: 147/59 157/64  151/64  Pulse: 90 97 94   Temp: 98.4 F (36.9 C)   99.9 F (37.7 C)  TempSrc: Oral   Axillary   Resp: 28 25 32 23  Height:      Weight:      SpO2: 93% 100% 96%    CBG (last 3)   Recent Labs  09/30/13 2032 10/01/13 0022 10/01/13 0359  GLUCAP 99 111* 98    IV Fluid Intake:   . sodium chloride 75 mL/hr at 10/01/13 0600  . niCARDipine Stopped (09/30/13 2000)  . propofol Stopped (09/30/13 2000)    MEDICATIONS  . alteplase  59 mg Intravenous Once  . antiseptic oral rinse  15 mL Mouth Rinse QID  . aspirin  300 mg Rectal Daily   Or  . aspirin EC  325 mg Oral Daily  . chlorhexidine  15 mL Mouth Rinse BID  . insulin aspart  0-15 Units Subcutaneous Q4H  . pantoprazole (PROTONIX) IV  40 mg Intravenous QHS  . potassium chloride  10 mEq Intravenous Once  . sodium chloride  1,000 mL Intravenous Once  . sotalol  80 mg Oral Q12H   PRN:  acetaminophen, acetaminophen, acetaminophen, acetaminophen, labetalol, ondansetron (ZOFRAN) IV  Diet:  NPO  Activity:  Bedrest DVT Prophylaxis:  SCDs   CLINICALLY SIGNIFICANT STUDIES Basic Metabolic Panel:  Recent Labs Lab 09/29/13 0849  09/29/13 0853 09/29/13 0902 09/30/13 0545  NA  --   < > 138 139 142  K  --   < > 3.4* 3.1* 3.3*  CL  --   < > 96 95* 105  CO2  --   --  28  --  26  GLUCOSE  --   < > 158* 157* 108*  BUN  --   < > 22 26* 14  CREATININE  --   < > 0.85 1.10 0.68  CALCIUM  --   --  9.8  --  9.1  MG 1.6  --   --   --   --   < > = values in this interval not displayed. Liver Function Tests:   Recent Labs Lab 09/29/13 0853  AST 28  ALT 17  ALKPHOS 98  BILITOT 0.3  PROT 6.9  ALBUMIN 3.5   CBC:   Recent Labs Lab 09/29/13 0853 09/29/13 0902 09/30/13 0545  WBC 6.3  --  7.7  NEUTROABS 3.6  --  5.3  HGB 10.1* 12.2 9.5*  HCT 32.8* 36.0 30.2*  MCV 80.2  --  80.7  PLT PLATELET CLUMPS NOTED ON SMEAR, UNABLE TO ESTIMATE  --  111*   Coagulation:   Recent Labs Lab 09/29/13 0853  LABPROT 11.7  INR 0.87   Cardiac Enzymes:   Recent Labs Lab 09/29/13 0853  TROPONINI <0.30   Urinalysis:   Recent  Labs Lab 09/29/13 0911  COLORURINE YELLOW  LABSPEC 1.013  PHURINE 6.5  GLUCOSEU NEGATIVE  HGBUR NEGATIVE  BILIRUBINUR NEGATIVE  KETONESUR NEGATIVE  PROTEINUR NEGATIVE  UROBILINOGEN 0.2  NITRITE NEGATIVE  LEUKOCYTESUR NEGATIVE   Lipid Panel    Component Value Date/Time   CHOL 152 09/30/2013 0545   TRIG 101 09/30/2013 0545   HDL 52 09/30/2013 0545   CHOLHDL 2.9 09/30/2013 0545   VLDL 20 09/30/2013 0545   LDLCALC 80 09/30/2013 0545   HgbA1C  Lab Results  Component Value Date   HGBA1C 5.3 09/30/2013    Urine Drug Screen:     Component Value Date/Time   LABOPIA POSITIVE* 09/29/2013 0911   COCAINSCRNUR NONE DETECTED 09/29/2013 0911   LABBENZ NONE DETECTED 09/29/2013 0911   AMPHETMU NONE DETECTED 09/29/2013 0911   THCU NONE DETECTED 09/29/2013 0911   LABBARB NONE DETECTED 09/29/2013 0911    Alcohol Level:   Recent Labs Lab 09/29/13 0853  ETH <11   CT of the brain   09/29/2013   No hemorrhage or acute infarction.    09/29/2013    No acute intracranial abnormality.  09/30/2013    Evolving acute infarct on the right. No hemorrhage.   Cerebral angiogram  09/29/2013 S/P RT Common carotid arteriogram followed by complete revascularization(TICI3) of occluded Rt MCA prox using 5mg  of IA tpa and one pass with [employer] Solitaire FR thrombectomy device .  MRI of the brain    MRA of the brain    2D Echocardiogram  EF 55%, wall motion normal, LA dilated. No ASD or PFO.   Carotid Doppler  See angio  CXR  09/29/2013   1. Tip of endotracheal tube 3.7 cm above the carina. 2. Mild bibasilar atelectasis with no radiographic evidence of acute cardiopulmonary process.    EKG  Sinus rhythm. Multiple premature complexes, vent & supraven. Minimal ST depression, inferior leads. Prolonged QT interval. Baseline wander in lead(s) V2  Therapy Recommendations   Physical Exam   Elderly Caucasian lady who is intubated.Awake alert. Afebrile. Head is nontraumatic. Neck is supple without bruit.  l. Cardiac exam  no murmur or gallop. Lungs are clear to auscultation. Distal pulses are well felt. Neurological Exam ;  Awake alert and follows simple commands. Right gaze preference but can follow gaze to left till midline only. Does not  blink to threat on left. Left lower face weak. Cough and gag present. Spontaneous antigravity movements on right and withdraws mildly to pain on left but has hemiparesis. Plantars mute bilaterally.  ASSESSMENT Tina Chambers is a 79 y.o. female presenting with left sided weakness. Taken to intervention where an angio showed M1 occlusion which was opened with IA tpa and solitaire. Sheath remains intact.  MRI Imaging pending. Suspect right brain stroke. Infarct felt to be embolic secondary to known atrial fibrillation.  On no antithrombotics given hx GIB prior to admission (was on xarelto prior to GIB). Now on rectal aspirin 300mg  daily for secondary stroke prevention. Patient with resultant VDRF, left hemiparesis, dysphagia. Work up underway.  atrial fibrillation, no on anticoagulation due to recent GIB 2 weeks ago secondary to PUD. Has seen Dr. Beverely Pace in [place] in the past. Now that she lives in [place], they are interested in getting a new cardiologist. Hypertension Hyperlipidemia, LDL 80, on Lipitor 10 PTA, now lipitor on hold as NPO, goal LDL < 100 (< 70 for diabetics) CHF - lasix on hold to allow permissive hypertension Aug 2014 fractured sacrum, in SNF until last week when she was moved to ALF at [facility] HGB A1C  5.3  Hospital day # 2  TREATMENT/PLAN  Check swallow today-Nectar thick.  Resume lipitor, home meds once able to swallow  Add aspirin 325 mg orally every day and or aspirin 300 mg suppository for secondary stroke prevention. Will consider eliquis once able to take oral medications as she has non-valvular atrial fibrillation.  MR pending.  CIR consult. Patient does live at independent living.  Gwendolyn Lima. Manson Passey, [facility], MBA, MHA [facility] Pager: [phone] 10/01/2013 9:38 AM  This patient is critically ill and at significant risk of neurological worsening, death and care requires constant monitoring of vital signs, hemodynamics,respiratory and cardiac monitoring,review of multiple databases, neurological assessment, discussion with family, other specialists and medical decision making of high complexity. I spent 30 minutes of neurocritical care time  in the care of  this patient.  I have personally obtained a history, examined the patient, evaluated imaging results, and formulated the assessment and plan of care. I agree with the above. Delia Heady, MD

## 2013-10-01 NOTE — Evaluation (Signed)
Physical Therapy Evaluation Patient Details Name: Tina Chambers MRN: [id number] DOB: Oct 26, 1927 Today's Date: 10/01/2013 Time: 1014-1100 PT Time Calculation (min): 46 min  PT Assessment / Plan / Recommendation History of Present Illness  Tina Chambers is a 79 y.o. female with PMH of HTN, hyperlipidemia, CHF, hypothyroidism, A. Fib (currently not on blood thinner), recent Gi bleeding secondary to peptic ulcer disease, who presents with left-sided weakness. Patient is a resident of assisted living facility ([address]). She has been doing fine until this morning 09/29/2013. She was walking, went to sit down at 8:00 AM. The workers noticed that she wasn't moving or interacting as normal. She was found to have left sided weakness and facial droopy. She could not move her left arm and leg initially. Taken to intervention where an angio showed M1 occlusion which was opened with IA tpa and solitaire. Sheath d/c'd and pt extubated on 11/5. Now with NG tube due to need for meds.CT shows evolving acute infarct on the right particularly involving the parietal lobe and temporal lobe.   Clinical Impression  Pt admitted with left sided weakness & left facial drop, MRI pending. Pt currently with functional limitations due to the deficits listed below (see PT Problem List). Pt with left neglect and sustained inattention.  Pt able to pass midline one time briefly with several trials.  Therapist asked pt to point to left hand and 2/4 trials pt pointed to pt's left hand other times pt pointed to therapist hand.  Pt also presenting with pusher syndrome to left side. Pt will benefit from skilled PT to increase their independence and safety with mobility to allow discharge to the venue listed below.      PT Assessment  Patient needs continued PT services    Follow Up Recommendations  CIR;Supervision/Assistance - 24 hour    Equipment Recommendations  None recommended by PT    Recommendations for  Other Services Rehab consult   Frequency Min 4X/week    Precautions / Restrictions Precautions Precautions: Fall Restrictions Weight Bearing Restrictions: No   Pertinent Vitals/Pain No c/o pain; VSS      Mobility  Bed Mobility Bed Mobility: Supine to Sit;Sitting - Scoot to Edge of Bed Supine to Sit: 1: +2 Total assist;With rails Supine to Sit: Patient Percentage: 30% Sitting - Scoot to Edge of Bed: 2: Max assist;With rail Details for Bed Mobility Assistance: (A) to elevate trunk OOB and (A) to advance LE OOB with max cues for technique.  Transfers Transfers: Sit to Stand;Stand to Sit;Stand Pivot Transfers Sit to Stand: 1: +2 Total assist;From elevated surface;From bed Sit to Stand: Patient Percentage: 40% Stand to Sit: 1: +2 Total assist;To chair/3-in-1 Stand to Sit: Patient Percentage: 50% Stand Pivot Transfers: 1: +2 Total assist Stand Pivot Transfers: Patient Percentage: 60% Details for Transfer Assistance: +2 (A) to initiate transfer and slowly descend to recliner with max cues.  (A) to advance hips to recliner and prevent pushing to the left side.  Ambulation/Gait Ambulation/Gait Assistance: Not tested (comment) Modified Rankin (Stroke Patients Only) Pre-Morbid Rankin Score: Moderately severe disability Modified Rankin: Severe disability    Exercises     PT Diagnosis: Difficulty walking;Generalized weakness;Hemiplegia non-dominant side  PT Problem List: Decreased strength;Decreased activity tolerance;Decreased balance;Decreased mobility;Decreased coordination;Decreased cognition;Decreased knowledge of use of DME;Decreased safety awareness;Impaired sensation PT Treatment Interventions: DME instruction;Functional mobility training;Therapeutic activities;Therapeutic exercise;Balance training;Neuromuscular re-education;Patient/family education     PT Goals(Current goals can be found in the care plan section) Acute Rehab PT Goals Patient  Stated Goal: To go to inpatient  rehab PT Goal Formulation: With family Time For Goal Achievement: 10/15/13 Potential to Achieve Goals: Good  Visit Information  Last PT Received On: 10/01/13 Assistance Needed: +2 History of Present Illness: Tina Chambers is a 79 y.o. female with PMH of HTN, hyperlipidemia, CHF, hypothyroidism, A. Fib (currently not on blood thinner), recent Gi bleeding secondary to peptic ulcer disease, who presents with left-sided weakness. Patient is a resident of assisted living facility ([address] She has been doing fine until this morning 09/29/2013. She was walking, went to sit down at 8:00 AM. The workers noticed that she wasn't moving or interacting as normal. She was found to have left sided weakness and facial droopy. She could not move her left arm and leg initially. Taken to intervention where an angio showed M1 occlusion which was opened with IA tpa and solitaire. Sheath d/c'd and pt extubated on 11/5. Now with NG tube due to need for meds.CT shows evolving acute infarct on the right particularly involving the parietal lobe and temporal lobe.        Prior Functioning  Home Living Family/patient expects to be discharged to:: Assisted living Living Arrangements: Other (Comment) (prior to ALF pt was living alone) Type of Home: Assisted living Home Equipment: Walker - 2 wheels Additional Comments: Pt recently at SNF for sacrum fx and able to improve to d/c to ALF.  Family is uncertain of overall discharge plans this session however are very interested in inpatient rehab Prior Function Level of Independence: Independent with assistive device(s) Comments: ambulating with RW Communication Communication: No difficulties Dominant Hand: Right    Cognition  Cognition Arousal/Alertness: Awake/alert Behavior During Therapy: Flat affect Overall Cognitive Status: Impaired/Different from baseline Area of Impairment: Attention;Memory;Following commands;Safety/judgement;Awareness;Problem  solving;Orientation Orientation Level: Disoriented to;Time Current Attention Level: Sustained Memory: Decreased short-term memory Following Commands: Follows multi-step commands inconsistently Safety/Judgement: Decreased awareness of deficits Awareness: Intellectual Problem Solving: Slow processing;Difficulty sequencing;Requires verbal cues;Requires tactile cues General Comments: Pt with left sided neglect and difficulty passin midline.  Pt able track one time past midline after several trials during evaluation.     Extremity/Trunk Assessment Upper Extremity Assessment Upper Extremity Assessment: Defer to OT evaluation Lower Extremity Assessment Lower Extremity Assessment: LLE deficits/detail (no response to painful stimuli) LLE Deficits / Details: Difficult to assess due to cognition and left neglect. Pt however with non purposeful movement on left side however no movement on command.  Pt continued to flex LE in bed.    Balance Balance Balance Assessed: Yes Static Sitting Balance Static Sitting - Balance Support: Feet supported Static Sitting - Level of Assistance: 2: Max assist Static Sitting - Comment/# of Minutes: (A) to promote midline and prevent pushing to left side.    End of Session PT - End of Session Equipment Utilized During Treatment: Gait belt Activity Tolerance: Patient limited by lethargy Patient left: in chair;with call bell/phone within reach;with family/visitor present Nurse Communication: Mobility status  GP     Ysabel Stankovich 10/01/2013, 1:15 PM  Jake Shark, PT DPT [phone]

## 2013-10-01 NOTE — Progress Notes (Signed)
PULMONARY  / CRITICAL CARE MEDICINE  Name: Tina Chambers MRN: [id number] DOB: 04/12/1927    ADMISSION DATE:  09/29/2013 CONSULTATION DATE:  09/29/2013  REFERRING MD :  Neurology  PRIMARY SERVICE:  Neurology  CHIEF COMPLAINT:  Acute respiratory failure  BRIEF PATIENT DESCRIPTION: 79 yo with past medical history of HTN, AF (stoped Xarelto because of bleeding ulcers) and CHF admitted 11/4 with acute CVA rewiring IR intervention. Brought to ICU intubated.  SIGNIFICANT EVENTS / STUDIES:  11/4  Admitted with acute CVA 11/4  Head CT >>> nad 11/4  IR >>> R common carotid angiogram, revascularization of R MCA with thrombectomy device 11/4  In ICU, intubated  LINES / TUBES: OETT 11/4 >>> 11/5 OGT 11/4 >>> 11/5 Foley 11/4 >>> R fem A-line 11/4 >>> 11/5 L fem A-line 11/4 >>> 11/5 Feeding tube 11/5 >>>  CULTURES:  ANTIBIOTICS:  INTERVAL HISTORY:  Remained extubated overnight.  Complains of sore throat.  VITAL SIGNS: Temp:  [98.4 F (36.9 C)-100.1 F (37.8 C)] 99.9 F (37.7 C) (11/06 0700) Pulse Rate:  [87-168] 94 (11/06 0600) Resp:  [13-34] 23 (11/06 0800) BP: (114-171)/(44-89) 151/59 mmHg (11/06 0800) SpO2:  [92 %-100 %] 96 % (11/06 0600) Arterial Line BP: (121-123)/(55-56) 123/56 mmHg (11/05 1000) FiO2 (%):  [30 %] 30 % (11/05 1556)  HEMODYNAMICS:   VENTILATOR SETTINGS: Vent Mode:  [-] CPAP;PSV FiO2 (%):  [30 %] 30 % Set Rate:  [14 bmp] 14 bmp Vt Set:  [440 mL] 440 mL PEEP:  [5 cmH20] 5 cmH20 Pressure Support:  [5 cmH20] 5 cmH20 Plateau Pressure:  [14 cmH20] 14 cmH20  INTAKE / OUTPUT: Intake/Output     11/05 0701 - 11/06 0700 11/06 0701 - 11/07 0700   P.O. 60    I.V. (mL/kg) 1028.4 (16.5) 150 (2.4)   NG/GT     Total Intake(mL/kg) 1088.4 (17.5) 150 (2.4)   Urine (mL/kg/hr) 1675 (1.1)    Blood     Total Output 1675     Net -586.6 +150         PHYSICAL EXAMINATION: General:  Comfortable, no respiratory distress Neuro:  Verbally responsive, follows  commands, LUE paresis, facial  droop HEENT:  PERRL Cardiovascular:  RRR, no m/r/g Lungs:  Bilateral air entry Abdomen: Soft, nontender, bowel sounds diminished Musculoskeletal:  No edema Skin:  Intact  LABS:  Recent Labs Lab 09/29/13 0849  09/29/13 0853 09/29/13 0902 09/29/13 1336 09/30/13 0545  HGB  --   --  10.1* 12.2  --  9.5*  WBC  --   --  6.3  --   --  7.7  PLT  --   --  PLATELET CLUMPS NOTED ON SMEAR, UNABLE TO ESTIMATE  --   --  111*  NA  --   --  138 139  --  142  K  --   < > 3.4* 3.1*  --  3.3*  CL  --   --  96 95*  --  105  CO2  --   --  28  --   --  26  GLUCOSE  --   --  158* 157*  --  108*  BUN  --   --  22 26*  --  14  CREATININE  --   --  0.85 1.10  --  0.68  CALCIUM  --   --  9.8  --   --  9.1  MG 1.6  --   --   --   --   --  AST  --   --  28  --   --   --   ALT  --   --  17  --   --   --   ALKPHOS  --   --  98  --   --   --   BILITOT  --   --  0.3  --   --   --   PROT  --   --  6.9  --   --   --   ALBUMIN  --   --  3.5  --   --   --   APTT  --   --  20*  --   --   --   INR  --   --  0.87  --   --   --   TROPONINI  --   --  <0.30  --   --   --   PHART  --   --   --   --  7.513*  --   PCO2ART  --   --   --   --  43.7  --   PO2ART  --   --   --   --  493.0*  --   < > = values in this interval not displayed.  Recent Labs Lab 09/30/13 1627 09/30/13 2032 10/01/13 0022 10/01/13 0359 10/01/13 0735  GLUCAP 86 99 111* 98 99   CXR:  11/4 >>> ETT good position, no overt airspace disease.  ASSESSMENT / PLAN:  PULMONARY A:  Acute respiratory failure in setting of acute CVA / intervention, resolved. P:   Gaol SpO2>92 Supplemental oxygen PRN Chloraseptic PRN  CARDIOVASCULAR A: NTH. PAF. Dyslipidemia. P:  BP goals per IR / Neurology D/c Cardene gtt Sotalol Labetalol PRN  RENAL A:  Hypokalemia. P:   Trend BMP K 40 x 1 NS@75   GASTROINTESTINAL A:  GERD. P:   NPO Preadmission Protonix, convert to liquid via NG SLP to  follow  HEMATOLOGIC A:  Anemia. Thrombocytopenia. P:  Trend CBC SCDs for DVT Px  INFECTIOUS A:  No acute issues. P:   No intervention required  ENDOCRINE  A:  Hyperglycemia. P:   SSI  NEUROLOGIC A:  Acute CVA s/p thrombectomy P:   Per Neurology Goal RASS 0 to -1 D/c Propofol gtt  PCCM will sign off.  Please reconsult as necessary.  I have personally obtained history, examined patient, evaluated and interpreted laboratory and imaging results, reviewed medical records, formulated assessment / plan and placed orders.  Lonia Farber, MD Pulmonary and Critical Care Medicine [facility] Pager: [phone]  10/01/2013, 8:57 AM

## 2013-10-01 NOTE — Progress Notes (Signed)
Rehab Admissions Coordinator Note:  Patient was screened by Trish Mage for appropriateness for an Inpatient Acute Rehab Consult.  At this time, an inpatient rehab consult has been ordered and is pending completion.  Trish Mage 10/01/2013, 1:47 PM  I can be reached at [phone].

## 2013-10-01 NOTE — Evaluation (Signed)
Speech Language Pathology Evaluation Patient Details Name: Tina Chambers MRN: [id number] DOB: 22-Nov-1927 Today's Date: 10/01/2013 Time: 0910-0940 SLP Time Calculation (min): 30 min  Problem List:  Patient Active Problem List   Diagnosis Date Noted  . Acute ischemic stroke 09/29/2013  . Acute respiratory failure 09/29/2013   Past Medical History:  Past Medical History  Diagnosis Date  . Hypertension   . Atrial fibrillation   . CHF (congestive heart failure)   . Hypothyroid   . Hyperlipidemia    Past Surgical History: History reviewed. No pertinent past surgical history. HPI:  Tina Chambers is a 79 y.o. female with PMH of HTN, hyperlipidemia, CHF, hypothyroidism, A. Fib (currently not on blood thinner), recent Gi bleeding secondary to peptic ulcer disease, who presents with left-sided weakness. Patient is a resident of assisted living facility ([address] She has been doing fine until this morning 09/29/2013. She was walking, went to sit down at 8:00 AM. The workers noticed that she wasn't moving or interacting as normal. She was found to have left sided weakness and facial droopy. She could not move her left arm and leg initially. Taken to intervention where an angio showed M1 occlusion which was opened with IA tpa and solitaire. Sheath d/c'd and pt extubated on 11/5. Now with NG tube due to need for meds.CT shows evolving acute infarct on the right particularly involving the parietal lobe and temporal lobe.    Assessment / Plan / Recommendation Clinical Impression  Pt demonstrates mild dysarthria and primary cognitive deficits associated with right hemisphere disorder including left neglect/difficulty directing gaze to left, decreased awareness, poor short term memory, briefly sustained attention to verbal and functional tasks and decreased reasoning with basic verbal tasks. Pt will need f/u acute SLP therapy to address deficits and promote increased participation in  ADLs. Would benefit from CIR.     SLP Assessment  Patient needs continued Speech Lanaguage Pathology Services    Follow Up Recommendations  Inpatient Rehab    Frequency and Duration min 2x/week  2 weeks   Pertinent Vitals/Pain NA   SLP Goals  SLP Goals Potential to Achieve Goals: Good  SLP Evaluation Prior Functioning  Cognitive/Linguistic Baseline: Within functional limits Type of Home: Assisted living Vocation: Retired   IT consultant  Overall Cognitive Status: Impaired/Different from baseline Arousal/Alertness: Awake/alert Orientation Level: Oriented to person;Disoriented to place;Disoriented to time;Disoriented to situation Attention: Focused;Sustained Focused Attention: Appears intact Sustained Attention: Impaired Sustained Attention Impairment: Functional basic;Verbal basic Memory: Impaired Memory Impairment: Decreased short term memory Decreased Short Term Memory: Verbal basic;Functional basic Awareness: Impaired Awareness Impairment: Intellectual impairment;Emergent impairment Problem Solving: Appears intact Behaviors: Perseveration Safety/Judgment: Impaired    Comprehension  Auditory Comprehension Overall Auditory Comprehension: Appears within functional limits for tasks assessed (with increased volume) Interfering Components: Hearing EffectiveTechniques: Increased volume    Expression Verbal Expression Overall Verbal Expression: Appears within functional limits for tasks assessed Written Expression Dominant Hand: Right   Oral / Motor Oral Motor/Sensory Function Overall Oral Motor/Sensory Function: Impaired Labial ROM: Reduced left Labial Symmetry: Abnormal symmetry left Labial Strength: Reduced Labial Sensation: Reduced Lingual ROM: Reduced left Lingual Symmetry: Abnormal symmetry left Lingual Strength: Reduced Lingual Sensation: Reduced Facial ROM: Within Functional Limits Facial Symmetry: Within Functional Limits Facial Strength: Within Functional  Limits Facial Sensation: Within Functional Limits Velum: Within Functional Limits Mandible: Within Functional Limits Motor Speech Overall Motor Speech: Impaired Respiration: Within functional limits Phonation: Hoarse Resonance: Within functional limits Articulation: Impaired Level of Impairment: Word Intelligibility: Intelligibility reduced  Word: 50-74% accurate Phrase: 50-74% accurate Sentence: 50-74% accurate Conversation: 50-74% accurate   GO    Harlon Ditty, MA CCC-SLP [phone]  Claudine Mouton 10/01/2013, 10:17 AM

## 2013-10-02 ENCOUNTER — Inpatient Hospital Stay (HOSPITAL_COMMUNITY)
Admission: RE | Admit: 2013-10-02 | Discharge: 2013-10-21 | DRG: 945 | Disposition: A | Discharge status: Skilled Nursing Facility | Payer: Medicare Other | Source: Intra-hospital | Attending: Physical Medicine & Rehabilitation | Admitting: Physical Medicine & Rehabilitation

## 2013-10-02 ENCOUNTER — Encounter (HOSPITAL_COMMUNITY): Payer: Self-pay | Admitting: Physical Medicine and Rehabilitation

## 2013-10-02 DIAGNOSIS — I6330 Cerebral infarction due to thrombosis of unspecified cerebral artery: Secondary | ICD-10-CM

## 2013-10-02 DIAGNOSIS — R471 Dysarthria and anarthria: Secondary | ICD-10-CM

## 2013-10-02 DIAGNOSIS — Z79899 Other long term (current) drug therapy: Secondary | ICD-10-CM

## 2013-10-02 DIAGNOSIS — R29810 Facial weakness: Secondary | ICD-10-CM

## 2013-10-02 DIAGNOSIS — I10 Essential (primary) hypertension: Secondary | ICD-10-CM

## 2013-10-02 DIAGNOSIS — R414 Neurologic neglect syndrome: Secondary | ICD-10-CM

## 2013-10-02 DIAGNOSIS — I4891 Unspecified atrial fibrillation: Secondary | ICD-10-CM

## 2013-10-02 DIAGNOSIS — G8110 Spastic hemiplegia affecting unspecified side: Secondary | ICD-10-CM

## 2013-10-02 DIAGNOSIS — N390 Urinary tract infection, site not specified: Secondary | ICD-10-CM

## 2013-10-02 DIAGNOSIS — R259 Unspecified abnormal involuntary movements: Secondary | ICD-10-CM

## 2013-10-02 DIAGNOSIS — R443 Hallucinations, unspecified: Secondary | ICD-10-CM

## 2013-10-02 DIAGNOSIS — E039 Hypothyroidism, unspecified: Secondary | ICD-10-CM

## 2013-10-02 DIAGNOSIS — K922 Gastrointestinal hemorrhage, unspecified: Secondary | ICD-10-CM

## 2013-10-02 DIAGNOSIS — I5030 Unspecified diastolic (congestive) heart failure: Secondary | ICD-10-CM

## 2013-10-02 DIAGNOSIS — E876 Hypokalemia: Secondary | ICD-10-CM

## 2013-10-02 DIAGNOSIS — I509 Heart failure, unspecified: Secondary | ICD-10-CM

## 2013-10-02 DIAGNOSIS — R1310 Dysphagia, unspecified: Secondary | ICD-10-CM

## 2013-10-02 DIAGNOSIS — D62 Acute posthemorrhagic anemia: Secondary | ICD-10-CM

## 2013-10-02 DIAGNOSIS — G8190 Hemiplegia, unspecified affecting unspecified side: Secondary | ICD-10-CM

## 2013-10-02 DIAGNOSIS — I63511 Cerebral infarction due to unspecified occlusion or stenosis of right middle cerebral artery: Secondary | ICD-10-CM

## 2013-10-02 DIAGNOSIS — Z7901 Long term (current) use of anticoagulants: Secondary | ICD-10-CM

## 2013-10-02 DIAGNOSIS — B9689 Other specified bacterial agents as the cause of diseases classified elsewhere: Secondary | ICD-10-CM

## 2013-10-02 DIAGNOSIS — R488 Other symbolic dysfunctions: Secondary | ICD-10-CM

## 2013-10-02 DIAGNOSIS — E785 Hyperlipidemia, unspecified: Secondary | ICD-10-CM

## 2013-10-02 DIAGNOSIS — Z5189 Encounter for other specified aftercare: Principal | ICD-10-CM

## 2013-10-02 LAB — GLUCOSE, CAPILLARY
Glucose-Capillary: 111 mg/dL — ABNORMAL HIGH (ref 70–99)
Glucose-Capillary: 127 mg/dL — ABNORMAL HIGH (ref 70–99)

## 2013-10-02 LAB — OCCULT BLOOD X 1 CARD TO LAB, STOOL: Fecal Occult Bld: NEGATIVE

## 2013-10-02 MED ORDER — GUAIFENESIN-DM 100-10 MG/5ML PO SYRP
5.0000 mL | ORAL_SOLUTION | Freq: Four times a day (QID) | ORAL | Status: DC | PRN
  Administered 2013-10-18: 10 mL via ORAL
  Filled 2013-10-02 (×2): qty 10

## 2013-10-02 MED ORDER — LEVOTHYROXINE SODIUM 75 MCG PO TABS
75.0000 ug | ORAL_TABLET | Freq: Every day | ORAL | Status: DC
  Filled 2013-10-02: qty 1

## 2013-10-02 MED ORDER — PANTOPRAZOLE SODIUM 40 MG PO PACK
40.0000 mg | PACK | Freq: Every day | ORAL | Status: DC
  Administered 2013-10-03 – 2013-10-21 (×18): 40 mg
  Filled 2013-10-02 (×20): qty 20

## 2013-10-02 MED ORDER — BIOTENE DRY MOUTH MT LIQD
15.0000 mL | Freq: Two times a day (BID) | OROMUCOSAL | Status: DC
  Administered 2013-10-02 – 2013-10-21 (×36): 15 mL via OROMUCOSAL

## 2013-10-02 MED ORDER — SENNOSIDES-DOCUSATE SODIUM 8.6-50 MG PO TABS: 1.0000 | ORAL_TABLET | Freq: Every evening | ORAL | Status: DC | PRN

## 2013-10-02 MED ORDER — DARIFENACIN HYDROBROMIDE ER 7.5 MG PO TB24
7.5000 mg | ORAL_TABLET | Freq: Every day | ORAL | Status: DC
  Administered 2013-10-03 – 2013-10-05 (×3): 7.5 mg via ORAL
  Filled 2013-10-02 (×5): qty 1

## 2013-10-02 MED ORDER — ACETAMINOPHEN 325 MG PO TABS
325.0000 mg | ORAL_TABLET | ORAL | Status: DC | PRN
  Administered 2013-10-05 – 2013-10-18 (×16): 650 mg via ORAL
  Filled 2013-10-02 (×17): qty 2

## 2013-10-02 MED ORDER — ASPIRIN 325 MG PO TABS
325.0000 mg | ORAL_TABLET | Freq: Every day | ORAL | Status: DC
  Administered 2013-10-03 – 2013-10-08 (×6): 325 mg via ORAL
  Filled 2013-10-02 (×7): qty 1

## 2013-10-02 MED ORDER — BISACODYL 10 MG RE SUPP: 10.0000 mg | Freq: Every day | RECTAL | Status: DC | PRN

## 2013-10-02 MED ORDER — POTASSIUM CHLORIDE CRYS ER 20 MEQ PO TBCR
20.0000 meq | EXTENDED_RELEASE_TABLET | Freq: Two times a day (BID) | ORAL | Status: AC
  Administered 2013-10-02 – 2013-10-04 (×4): 20 meq via ORAL
  Filled 2013-10-02 (×4): qty 1

## 2013-10-02 MED ORDER — DARIFENACIN HYDROBROMIDE ER 7.5 MG PO TB24
7.5000 mg | ORAL_TABLET | Freq: Every day | ORAL | Status: DC
  Filled 2013-10-02: qty 1

## 2013-10-02 MED ORDER — SODIUM CHLORIDE 0.9 % IV SOLN
INTRAVENOUS | Status: DC
  Administered 2013-10-02 – 2013-10-03 (×2): via INTRAVENOUS
  Filled 2013-10-02 (×5): qty 1000

## 2013-10-02 MED ORDER — RESOURCE THICKENUP CLEAR PO POWD
ORAL | Status: DC | PRN
  Filled 2013-10-02: qty 125

## 2013-10-02 MED ORDER — ATORVASTATIN CALCIUM 10 MG PO TABS
10.0000 mg | ORAL_TABLET | Freq: Every day | ORAL | Status: DC
  Filled 2013-10-02: qty 1

## 2013-10-02 MED ORDER — ONDANSETRON HCL 4 MG/2ML IJ SOLN: 4.0000 mg | Freq: Four times a day (QID) | INTRAMUSCULAR | Status: DC | PRN

## 2013-10-02 MED ORDER — ATORVASTATIN CALCIUM 10 MG PO TABS
10.0000 mg | ORAL_TABLET | Freq: Every day | ORAL | Status: DC
  Administered 2013-10-02 – 2013-10-19 (×17): 10 mg via ORAL
  Filled 2013-10-02 (×21): qty 1

## 2013-10-02 MED ORDER — ONDANSETRON HCL 4 MG PO TABS: 4.0000 mg | ORAL_TABLET | Freq: Four times a day (QID) | ORAL | Status: DC | PRN

## 2013-10-02 MED ORDER — ALUM & MAG HYDROXIDE-SIMETH 200-200-20 MG/5ML PO SUSP: 30.0000 mL | ORAL | Status: DC | PRN

## 2013-10-02 MED ORDER — FLUOXETINE HCL 10 MG PO CAPS
10.0000 mg | ORAL_CAPSULE | Freq: Every day | ORAL | Status: DC
  Administered 2013-10-03 – 2013-10-21 (×19): 10 mg via ORAL
  Filled 2013-10-02 (×21): qty 1

## 2013-10-02 MED ORDER — TRAZODONE HCL 50 MG PO TABS
25.0000 mg | ORAL_TABLET | Freq: Every evening | ORAL | Status: DC | PRN
  Administered 2013-10-18 – 2013-10-20 (×2): 50 mg via ORAL
  Filled 2013-10-02 (×2): qty 1

## 2013-10-02 MED ORDER — LEVOTHYROXINE SODIUM 75 MCG PO TABS
75.0000 ug | ORAL_TABLET | Freq: Every day | ORAL | Status: DC
  Administered 2013-10-03 – 2013-10-21 (×17): 75 ug via ORAL
  Filled 2013-10-02 (×20): qty 1

## 2013-10-02 MED ORDER — FLEET ENEMA 7-19 GM/118ML RE ENEM: 1.0000 | ENEMA | Freq: Once | RECTAL | Status: AC | PRN

## 2013-10-02 MED ORDER — SOTALOL HCL 80 MG PO TABS
80.0000 mg | ORAL_TABLET | Freq: Two times a day (BID) | ORAL | Status: DC
  Administered 2013-10-02 – 2013-10-21 (×38): 80 mg via ORAL
  Filled 2013-10-02 (×41): qty 1

## 2013-10-02 MED ORDER — FLUOXETINE HCL 10 MG PO CAPS
10.0000 mg | ORAL_CAPSULE | Freq: Every day | ORAL | Status: DC
  Filled 2013-10-02: qty 1

## 2013-10-02 NOTE — Clinical Social Work Note (Signed)
Clinical Social Work Department BRIEF PSYCHOSOCIAL ASSESSMENT 10/02/2013  Patient:  Tina Chambers, Tina Chambers     Account Number:  [id number]     Admit date:  09/29/2013  Clinical Social Worker:  Verl Blalock  Date/Time:  10/02/2013 10:04 AM  Referred by:  Physician  Date Referred:  10/02/2013 Referred for  ALF Placement   Other Referral:   Interview type:  Family Other interview type:   Patient confused - spoke to patient son at bedside    PSYCHOSOCIAL DATA Living Status:  FACILITY Admitted from facility:  [facility] Level of care:  Assisted Living Primary support name:  Tatumn Corbridge [phone] Primary support relationship to patient:  CHILD, ADULT Degree of support available:   Strong    CURRENT CONCERNS Current Concerns  Post-Acute Placement   Other Concerns:    SOCIAL WORK ASSESSMENT / PLAN Clinical Social Worker met with patient son at bedside to offer support and discuss patient needs at discharge. Patient son states that patient had been at [facility] for rehab and just moved to [facility] for 5 days prior to hospitalization.  Patient son states that family is hopeful that patient will be able to return to [facility].  Patient is being seen by inpatient rehab for possible admission.  CSW remains available for support and to facilitate discharge needs as deemed appropriate once medically ready.   Assessment/plan status:  Psychosocial Support/Ongoing Assessment of Needs Other assessment/ plan:   Information/referral to community resources:   Visual merchandiser spoke with patient son about possible return to SNF - family is in agreement if that level is needed at discharge.  Patient family would entertain the idea of [facility] again but would be open to other facilities as well.    PATIENT'S/FAMILY'S RESPONSE TO PLAN OF CARE: Patient alert and oriented to self and place only at this time.  Patient son states that patient has 3  children who are all in agreement and willing to offer support as needed at discharge.  Patient from ALF and family would ultimately like her to return.  Patient family realistic about patient potential needs at discharge.  Patient son understanding of CSW role and verbalized appreciation of CSW support and concern.

## 2013-10-02 NOTE — PMR Pre-admission (Signed)
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  LBD=08/25/13  Was at [facility] SNF Outpatient: 80%     Co-Pay: 20% Home Health: 100%      Co-Pay: none DME: 80%     Co-Pay: 20% Providers: patient's choice  SECONDARY: [employer]      Policy#: [id number]      Subscriber: Fleet Contras CM Name:        Phone#:       Fax#:   Pre-Cert#:        Employer: Not employed Benefits:  Phone #: [phone]     Name:   Eff. Date:       Deduct:        Out of Pocket Max:        Life Max:   CIR:        SNF:   Outpatient:       Co-Pay:   Home Health:        Co-Pay:   DME:       Co-Pay:    Emergency Contact Information Contact Information   Name Relation Home Work Mobile   [place] Son   [phone]   [place]yn[place] Sours Daughter   [phone]     Current Medical History  Patient Admitting Diagnosis:  R MCA infarct  History of Present Illness: A 79 y.o. female with history of HTN, recent pelvic fracture, A Fib--off blood thinners due to GIB (most recent ~ 2 weeks ago), CHF who was admitted on 09/29/13 past being found with facial droop, right gaze preference and left sided weakness. CCT without evidence of bleed and patient underwent cerebral angiogram with complete revascularization of occluded R-MCA with IA tpa and thrombectomy with Solitaire device. Follow up CCT with evolving acute infarct R-MCA territory involving  parietal and temporal lobe. 2D echo with EF 50-55% and no wall abnormality, grade 1 diastolic dysfunction. Carotid dopplers with 40-59% R-ICA stenosis and 1- 39% L-ICA stenosis. Extubated on 11/05 and started on D 1, nectar thick liquids due to significant pocketing with spillage as well as decrease LOC. Patient continues with left sided weakness, left inattention with right gaze preference, poor short term memory, poor attention to tasks. Therapies evaluations done and CIR recommended by team.     Total: 17=NIH  GCS=14  Past Medical History  Past Medical History  Diagnosis Date  . Hypertension   . Atrial fibrillation   . CHF (congestive heart failure)   . Hypothyroid   . Hyperlipidemia   .  GIB (gastrointestinal bleeding)     recurrent  . Pelvic fracture     Family History  family history is not on file.  Prior Rehab/Hospitalizations:  Was recently at SNF [facility] for 2 months receiving PT/OT/ST 5 days a week.   Current Medications  Current facility-administered medications:0.9 %  sodium chloride infusion, , Intravenous, Continuous, Ritta Slot, MD, Last Rate: 75 mL/hr at 10/02/13 1100;  acetaminophen (TYLENOL) suppository 650 mg, 650 mg, Rectal, Q4H PRN, Ritta Slot, MD;  acetaminophen (TYLENOL) tablet 650 mg, 650 mg, Oral, Q4H PRN, Ritta Slot, MD;  alteplase (ACTIVASE) 1 mg/mL infusion 59 mg, 59 mg, Intravenous, Once, Ritta Slot, MD antiseptic oral rinse (BIOTENE) solution 15 mL, 15 mL, Mouth Rinse, BID, Micki Riley, MD, 15 mL at 10/02/13 0800;  aspirin EC tablet 325 mg, 325 mg, Oral, Daily, Layne Benton, NP;  aspirin suppository 300 mg, 300 mg, Rectal, Daily, Layne Benton, NP, 300 mg at 10/01/13 1442;  atorvastatin (LIPITOR) tablet 10 mg, 10 mg, Oral, q1800, Cathlyn Parsons, PA-C;  darifenacin (ENABLEX) 24 hr tablet 7.5 mg, 7.5 mg, Oral, Daily, Cathlyn Parsons, PA-C FLUoxetine (PROZAC) capsule 10 mg, 10 mg, Oral, Daily, Cathlyn Parsons, PA-C;  insulin  aspart (novoLOG) injection 0-15 Units, 0-15 Units, Subcutaneous, Q4H, Lonia Farber, MD, 1 Units at 10/02/13 1211;  labetalol (NORMODYNE,TRANDATE) injection 10 mg, 10 mg, Intravenous, Q10 min PRN, Ritta Slot, MD, 5 mg at 09/29/13 1322 [START ON 10/03/2013] levothyroxine (SYNTHROID, LEVOTHROID) tablet 75 mcg, 75 mcg, Oral, QAC breakfast, Cathlyn Parsons, PA-C;  ondansetron [facility]) injection 4 mg, 4 mg, Intravenous, Q6H PRN, Oneal Grout, MD, 4 mg at 10/02/13 0041;  pantoprazole sodium (PROTONIX) 40 mg/20 mL oral suspension 40 mg, 40 mg, Per Tube, Daily, Lonia Farber, MD, 40 mg at 10/02/13 1204 phenol (CHLORASEPTIC) mouth spray 1 spray, 1 spray, Mouth/Throat, PRN, Lonia Farber, MD;  RESOURCE THICKENUP CLEAR, , Oral, PRN, Cathlyn Parsons, PA-C;  sotalol (BETAPACE) tablet 80 mg, 80 mg, Oral, Q12H, Ritta Slot, MD, 80 mg at 10/02/13 1208  Patients Current Diet: Dysphagia  Precautions / Restrictions Precautions Precautions: Fall Precautions/Special Needs: Swallowing Restrictions Weight Bearing Restrictions: No   Prior Activity Level Limited Community (1-2x/wk): Mostly homebound since 08/14.  Went out to MD appts and out to lunch.  Home Assistive Devices / Equipment Home Assistive Devices/Equipment: Transfer belt;Walker (specify type) Home Equipment: Walker - 2 wheels  Prior Functional Level Prior Function Level of Independence: Independent with assistive device(s) Comments: ambulating with RW  Current Functional Level Cognition  Arousal/Alertness: Awake/alert Overall Cognitive Status: Impaired/Different from baseline Current Attention Level: Sustained Orientation Level: Oriented to person;Disoriented to time;Disoriented to situation;Oriented to place Following Commands: Follows multi-step commands inconsistently Safety/Judgement: Decreased awareness of deficits General Comments: Pt with left sided neglect and difficulty passin midline.   Pt able track one time past midline after several trials during evaluation.  Attention: Focused;Sustained Focused Attention: Appears intact Sustained Attention: Impaired Sustained Attention Impairment: Functional basic;Verbal basic Memory: Impaired Memory Impairment: Decreased short term memory Decreased Short Term Memory: Verbal basic;Functional basic Awareness: Impaired Awareness Impairment: Intellectual impairment;Emergent impairment Problem Solving: Appears intact Behaviors: Perseveration Safety/Judgment: Impaired    Extremity Assessment (includes Sensation/Coordination)          ADLs       Mobility  Bed Mobility: Supine to Sit;Sitting - Scoot to Edge of Bed Supine to Sit: 1: +2 Total assist;With rails Supine to Sit: Patient Percentage: 30% Sitting - Scoot to Edge of Bed: 2:  Max assist;With rail    Transfers  Transfers: Sit to Stand;Stand to [employer] Sit to Stand: 1: +2 Total assist;From elevated surface;From bed Sit to Stand: Patient Percentage: 40% Stand to Sit: 1: +2 Total assist;To chair/3-in-1 Stand to Sit: Patient Percentage: 50% Stand Pivot Transfers: 1: +2 Total assist Stand Pivot Transfers: Patient Percentage: 60%    Ambulation / Gait / Stairs / Wheelchair Mobility  Ambulation/Gait Ambulation/Gait Assistance: Not tested (comment)    Posture / Balance Static Sitting Balance Static Sitting - Balance Support: Feet supported Static Sitting - Level of Assistance: 2: Max assist Static Sitting - Comment/# of Minutes: (A) to promote midline and prevent pushing to left side.      Special needs/care consideration BiPAP/CPAP No CPM No Continuous Drip IV 0.9% NS 75 ml/hr Dialysis No        Life Vest No Oxygen Has O2 per nasal cannula in ICU Special Bed No Trach Size No Wound Vac (area) No      Skin Facial skin cancers removed 1 yr ago, healing well                             Bowel mgmt: Had BM 10/01/13 Bladder mgmt: Catheter removed 11/06,  now incontinence Diabetic mgmt No    Previous Home Environment Living Arrangements: Other (Comment) (prior to ALF pt was living alone) Type of Home: Assisted living Home Care Services: No Additional Comments: Pt recently at SNF for sacrum fx and able to improve to d/c to ALF.  Family is uncertain of overall discharge plans this session however are very interested in inpatient rehab  Discharge Living Setting Plans for Discharge Living Setting: Other (Comment) Type of Home at Discharge: Assisted living (Living at [facility] for 5 days PTA.) Care Facility Name at Discharge: [facility] (May need SNF prior to return to ALF.) Discharge Home Layout:  (Lives on the 3rd level close to the elevator.) Does the patient have any problems obtaining your medications?: No  Social/Family/Support Systems Patient Roles: Parent (Has 2 sons and a daughter.) Contact Information: Kemonie Cutillo - son, POA Anticipated Caregiver: Plans are for ALF or for SNF if she cannot return to ALF Anticipated Caregiver's Contact Information: Deniece Portela - son [phone]; Eunice Blase - daughter [phone] Ability/Limitations of Caregiver: Son retired, another son works, daughter lives about 45 minutes away Caregiver Availability: Other (Comment) Discharge Plan Discussed with Primary Caregiver: Yes (Met with son and daughter.) Is Caregiver In Agreement with Plan?: Yes Does Caregiver/Family have Issues with Lodging/Transportation while Pt is in Rehab?: No  Goals/Additional Needs Patient/Family Goal for Rehab: PT/OT min assist, ST supervision goals Expected length of stay: 22-29 days Cultural Considerations: [employer] Dietary Needs: Dys 1, nectar thick liquids Equipment Needs: TBD Additional Information: Recently at [facility] SNF for 2 months after sacral fx.  Moved to [facility] ALF and was there for 5 days.  Lived alone up until 08/14. Pt/Family Agrees to Admission and willing to participate:  Yes Program Orientation Provided & Reviewed with Pt/Caregiver Including Roles  & Responsibilities: Yes  Decrease burden of Care through IP rehab admission: Diet advancement, Decrease number of caregivers, Bowel and bladder program and Patient/family education  Possible need for SNF placement upon discharge: Yes, will need SNF if she cannot return to ALF  Patient Condition: This patient's condition remains as documented in the consult dated 10/01/13, in which the Rehabilitation Physician determined and documented that the patient's condition is appropriate for  intensive rehabilitative care in an inpatient rehabilitation facility. Will admit to inpatient rehab today.  Preadmission Screen Completed By:  Trish Mage, 10/02/2013 12:55 PM ______________________________________________________________________   Discussed status with Dr. Riley Kill on 10/02/13 at 1306 and received telephone approval for admission today.  Admission Coordinator:  Trish Mage, time1306/Date11/07/14

## 2013-10-02 NOTE — H&P (Signed)
Physical Medicine and Rehabilitation Admission H&P    Chief Complaint  Patient presents with  . Left sided weakness, facial droop, slurred speech    HPI: DAIONNA CROSSLAND is a 79 y.o. female with history of HTN, recent pelvic fracture, A Fib--off blood thinners due to GIB (most recent ~ 2 weeks ago), CHF who was admitted on 09/29/13 past being found with facial droop, right gaze preference and left sided weakness. CCT without evidence of bleed and patient underwent cerebral angiogram with complete revascularization of occluded R-MCA with IA tpa and thrombectomy with Solitaire device. Follow up CCT with evolving acute infarct R-MCA territory involving parietal and temporal lobe. 2D echo with EF 50-55% and no wall abnormality, grade 1 diastolic dysfunction. Carotid dopplers with 40-59% R-ICA stenosis and 1- 39% L-ICA stenosis. Extubated on 11/05 and started on D 1, nectar thick liquids due to significant pocketing with spillage as well as decrease LOC.  Patient continues with left sided weakness, left inattention with right gaze preference, poor short term memory, poor attention to tasks. Therapies evaluations done and CIR recommended by team.    Review of Systems  Unable to perform ROS: mental acuity   Past Medical History  Diagnosis Date  . Hypertension   . Atrial fibrillation   . CHF (congestive heart failure)   . Hypothyroid   . Hyperlipidemia    History reviewed. No pertinent past surgical history.  History reviewed. No pertinent family history.   Social History:  Moved to [place] recently- Was in SNF till a week PTA ( Aug-a week PTA) for pelvic fracture. Per report she has never smoked. She does not have any smokeless tobacco history on file. Per reports that she does not drink alcohol or use illicit drugs.  Allergies  Allergen Reactions  . Phenergan [Promethazine Hcl]     From [facility]   Medications Prior to Admission  Medication Sig Dispense Refill  . atorvastatin  (LIPITOR) 10 MG tablet Take 10 mg by mouth daily.      Marland Kitchen diltiazem (DILACOR XR) 180 MG 24 hr capsule Take 180 mg by mouth daily.      . ferrous sulfate 325 (65 FE) MG tablet Take 325 mg by mouth 2 (two) times daily with a meal.      . FLUoxetine (PROZAC) 10 MG capsule Take 10 mg by mouth daily.      Marland Kitchen HYDROcodone-acetaminophen (NORCO/VICODIN) 5-325 MG per tablet Take 1 tablet by mouth daily.      Marland Kitchen HYDROcodone-acetaminophen (NORCO/VICODIN) 5-325 MG per tablet Take 1 tablet by mouth every 4 (four) hours as needed for moderate pain.      Marland Kitchen levothyroxine (SYNTHROID, LEVOTHROID) 75 MCG tablet Take 75 mcg by mouth daily before breakfast.      . omeprazole (PRILOSEC) 20 MG capsule Take 20 mg by mouth daily.      Marland Kitchen OVER THE COUNTER MEDICATION Take 2 capsules by mouth 2 (two) times daily. "Probiotic Intestinal 50-500-100"      . solifenacin (VESICARE) 10 MG tablet Take 10 mg by mouth daily.      . sotalol (BETAPACE) 80 MG tablet Take 80 mg by mouth 2 (two) times daily.      . temazepam (RESTORIL) 15 MG capsule Take 15 mg by mouth at bedtime as needed for sleep.      Marland Kitchen torsemide (DEMADEX) 10 MG tablet Take 10 mg by mouth daily.      . traMADol (ULTRAM) 50 MG tablet Take by mouth 3 (three) times  daily as needed for moderate pain.        Home: Home Living Family/patient expects to be discharged to:: Assisted living Living Arrangements: Other (Comment) (prior to ALF pt was living alone) Type of Home: Assisted living Home Equipment: Walker - 2 wheels Additional Comments: Pt recently at SNF for sacrum fx and able to improve to d/c to ALF.  Family is uncertain of overall discharge plans this session however are very interested in inpatient rehab   Functional History: Prior Function Vocation: Retired Comments: ambulating with RW  Functional Status:  Mobility: Bed Mobility Bed Mobility: Supine to Sit;Sitting - Scoot to Edge of Bed Supine to Sit: 1: +2 Total assist;With rails Supine to Sit: Patient  Percentage: 30% Sitting - Scoot to Edge of Bed: 2: Max assist;With rail Transfers Transfers: Sit to Stand;Stand to [employer] Sit to Stand: 1: +2 Total assist;From elevated surface;From bed Sit to Stand: Patient Percentage: 40% Stand to Sit: 1: +2 Total assist;To chair/3-in-1 Stand to Sit: Patient Percentage: 50% Stand Pivot Transfers: 1: +2 Total assist Stand Pivot Transfers: Patient Percentage: 60% Ambulation/Gait Ambulation/Gait Assistance: Not tested (comment)    ADL:    Cognition: Cognition Overall Cognitive Status: Impaired/Different from baseline Arousal/Alertness: Awake/alert Orientation Level: Oriented to person;Disoriented to time;Disoriented to situation;Oriented to place Attention: Focused;Sustained Focused Attention: Appears intact Sustained Attention: Impaired Sustained Attention Impairment: Functional basic;Verbal basic Memory: Impaired Memory Impairment: Decreased short term memory Decreased Short Term Memory: Verbal basic;Functional basic Awareness: Impaired Awareness Impairment: Intellectual impairment;Emergent impairment Problem Solving: Appears intact Behaviors: Perseveration Safety/Judgment: Impaired Cognition Arousal/Alertness: Awake/alert Behavior During Therapy: Flat affect Overall Cognitive Status: Impaired/Different from baseline Area of Impairment: Attention;Memory;Following commands;Safety/judgement;Awareness;Problem solving;Orientation Orientation Level: Disoriented to;Time Current Attention Level: Sustained Memory: Decreased short-term memory Following Commands: Follows multi-step commands inconsistently Safety/Judgement: Decreased awareness of deficits Awareness: Intellectual Problem Solving: Slow processing;Difficulty sequencing;Requires verbal cues;Requires tactile cues General Comments: Pt with left sided neglect and difficulty passin midline.  Pt able track one time past midline after several trials during evaluation.       Blood pressure 100/67, pulse 84, temperature 98.3 F (36.8 C), temperature source Oral, resp. rate 27, height 5\' 4"  (1.626 m), weight 62.2 kg (137 lb 2 oz), SpO2 100.00%. Physical Exam Constitutional: She appears well-developed and well-nourished.  HENT: NGT in place Head: Normocephalic and atraumatic.  Eyes: Conjunctivae and EOM are normal. Pupils are equal, round, and reactive to light.  Neck: Normal range of motion. Neck supple.  Cardiovascular: Normal rate, regular rhythm and normal heart sounds. No murmurs No murmur heard.  Respiratory: Effort normal and breath sounds normal. No respiratory distress. She has no wheezes. She has no rales.  GI: Soft. Bowel sounds are normal. She exhibits no distension. There is no tenderness. There is no rebound.  Neurological:  Left tongue deviation and facial droop. She exhibits abnormal muscle tone. She displays no seizure activity.  Very distracted. Speaks incoherently in small sentences and phrases. Does follow simple one step commands with repetition. Oriented to self only. Mitt on right hand. She attempts to bit mitten to remove from hand. Constantly grabbing at objects in bed with mitten on.  Reflex Scores:  Tricep reflexes are 3+ on the left side.  Bicep reflexes are 3+ on the left side.  Brachioradialis reflexes are 3+ on the left side.  Patellar reflexes are 3+ on the left side.  Achilles reflexes are 3+ on the left side. Flexor withdrawal spasticity left lower extremity, sustained clonus left ankle Motor strength is 0/5 left  upper extremity trace hip extension left lower extremity Right upper extremity 4+/5 in the deltoid, bicep, tricep, grip right lower extremity 4/5 in the hip flexor knee extensor ankle dorsiflexor plantar flexor Sensation is absent to light touch in the left upper extremity but withdraws to pain Left lower extremity withdrawal to pain, absent light touch Right upper and right lower light touch is intact Patient  appears to have a field cut on the left side but difficult to test. She does turn her head to look at objects on the left side when cued     Results for orders placed during the hospital encounter of 09/29/13 (from the past 48 hour(s))  GLUCOSE, CAPILLARY     Status: Abnormal   Collection Time    09/30/13 12:44 PM      Result Value Range   Glucose-Capillary 109 (*) 70 - 99 mg/dL  GLUCOSE, CAPILLARY     Status: None   Collection Time    09/30/13  4:27 PM      Result Value Range   Glucose-Capillary 86  70 - 99 mg/dL   Comment 1 Notify RN     Comment 2 Documented in Chart    GLUCOSE, CAPILLARY     Status: None   Collection Time    09/30/13  8:32 PM      Result Value Range   Glucose-Capillary 99  70 - 99 mg/dL   Comment 1 Notify RN     Comment 2 Documented in Chart    GLUCOSE, CAPILLARY     Status: Abnormal   Collection Time    10/01/13 12:22 AM      Result Value Range   Glucose-Capillary 111 (*) 70 - 99 mg/dL  GLUCOSE, CAPILLARY     Status: None   Collection Time    10/01/13  3:59 AM      Result Value Range   Glucose-Capillary 98  70 - 99 mg/dL  GLUCOSE, CAPILLARY     Status: None   Collection Time    10/01/13  7:35 AM      Result Value Range   Glucose-Capillary 99  70 - 99 mg/dL  GLUCOSE, CAPILLARY     Status: None   Collection Time    10/01/13 11:59 AM      Result Value Range   Glucose-Capillary 86  70 - 99 mg/dL  GLUCOSE, CAPILLARY     Status: Abnormal   Collection Time    10/01/13  4:09 PM      Result Value Range   Glucose-Capillary 112 (*) 70 - 99 mg/dL   Comment 1 Notify RN     Comment 2 Documented in Chart    GLUCOSE, CAPILLARY     Status: Abnormal   Collection Time    10/01/13  8:14 PM      Result Value Range   Glucose-Capillary 116 (*) 70 - 99 mg/dL   Comment 1 Notify RN     Comment 2 Documented in Chart    GLUCOSE, CAPILLARY     Status: Abnormal   Collection Time    10/02/13 12:40 AM      Result Value Range   Glucose-Capillary 124 (*) 70 - 99 mg/dL   GLUCOSE, CAPILLARY     Status: Abnormal   Collection Time    10/02/13  3:27 AM      Result Value Range   Glucose-Capillary 115 (*) 70 - 99 mg/dL  OCCULT BLOOD X 1 CARD TO LAB, STOOL  Status: None   Collection Time    10/02/13  5:09 AM      Result Value Range   Fecal Occult Bld NEGATIVE  NEGATIVE  GLUCOSE, CAPILLARY     Status: Abnormal   Collection Time    10/02/13  7:36 AM      Result Value Range   Glucose-Capillary 111 (*) 70 - 99 mg/dL  GLUCOSE, CAPILLARY     Status: Abnormal   Collection Time    10/02/13 12:10 PM      Result Value Range   Glucose-Capillary 127 (*) 70 - 99 mg/dL   Ct Head Wo Contrast  09/30/2013   CLINICAL DATA:  Status post interventional case, followup stroke  EXAM: CT HEAD WITHOUT CONTRAST  TECHNIQUE: Contiguous axial images were obtained from the base of the skull through the vertex without intravenous contrast.  COMPARISON:  09/29/2013  FINDINGS: No evidence of hemorrhage or extra-axial fluid. There is decreased attenuation in the white matter of the middle cerebral artery territory on the right, particularly involving the parietal lobe and temporal lobe. There is also decreased sulcation, with mass effect on the right lateral ventricle. No evidence of midline shift. Stable lacunar infarct right thalamus.  IMPRESSION: Evolving acute infarct on the right. No hemorrhage.   Electronically Signed   By: Esperanza Heir M.D.   On: 09/30/2013 19:05   Mr Brain Wo Contrast  10/01/2013   CLINICAL DATA:  Left facial droop with left arm weakness, recent angio demonstrated right M1 occlusion. Status post recannulization with intra-arterial TPA and solitaire.  EXAM: MRI HEAD WITHOUT CONTRAST  TECHNIQUE: Multiplanar, multisequence MR imaging was performed. No intravenous contrast was administered.  COMPARISON:  Prior CT from 09/30/2013.  FINDINGS: Study is somewhat degraded by motion artifact.  There is extensive restricted diffusion throughout the right MCA territory  involving the right frontal, temporal, and parietal lobes, consistent with acute ischemic infarct. Corresponding signal dropout is seen on the ADC map. Involvement within the temporal lobe is primarily within the insular cortex. Infarct is present within the right basal ganglia including the caudate, detain min, and globus pallidus, compatible with meticulous striate involvement. No infratentorial infarct is identified. A single small 5 mm focus of restricted diffusion is seen within the high left parietal lobe near the vertex (series 4, image 26). There is cortical swelling and edema on T2 weighted sequence. No midline shift or hydrocephalus. Minimal effacement of the right lateral ventricle is present. The basilar cisterns remain widely patent.  Normal intravascular flow voids are seen within the intracranial circulation.  There is no acute intracranial hemorrhage.  Mild diffuse atrophy is present. No mass lesion. No extra-axial fluid collection.  IMPRESSION: 1. Acute ischemic right MCA territory infarct involving the right frontal, temporal and parietal lobes as well as the right basal ganglia. No acute intracranial hemorrhage. 2. Swelling and edema within the area of infarction within the right cerebral hemisphere with mild mass effect on the right lateral ventricle. No midline shift or hydrocephalus. 3. Small 5 mm focal infarct within the high rib left parietal lobe near the vertex. 4. Diffuse age-related atrophy.   Electronically Signed   By: Rise Mu M.D.   On: 10/01/2013 22:56   Dg Abd Portable 1v  09/30/2013   CLINICAL DATA:  Feeding tube placement  EXAM: PORTABLE ABDOMEN - 1 VIEW  COMPARISON:  Prior radiograph from 09/29/2013.  FINDINGS: The tip of a weighted [place] feeding tube overlies the gastric fundus with tip projecting cephalad.  The  visualized bowel gas pattern is nonobstructive. No acute abnormality identified within the abdomen and pelvis.  Dextroscoliosis with severe multilevel  degenerative disc disease again noted within the lumbar spine.  IMPRESSION: Tip of the [place] feeding tube overlying the gastric fundus and projecting cephalad.   Electronically Signed   By: Rise Mu M.D.   On: 09/30/2013 23:31    Post Admission Physician Evaluation: 1. Functional deficits secondary  to large thrombotic right mca infarct. 2. Patient is admitted to receive collaborative, interdisciplinary care between the physiatrist, rehab nursing staff, and therapy team. 3. Patient's level of medical complexity and substantial therapy needs in context of that medical necessity cannot be provided at a lesser intensity of care such as a SNF. 4. Patient has experienced substantial functional loss from his/her baseline which was documented above under the "Functional History" and "Functional Status" headings.  Judging by the patient's diagnosis, physical exam, and functional history, the patient has potential for functional progress which will result in measurable gains while on inpatient rehab.  These gains will be of substantial and practical use upon discharge  in facilitating mobility and self-care at the household level. 5. Physiatrist will provide 24 hour management of medical needs as well as oversight of the therapy plan/treatment and provide guidance as appropriate regarding the interaction of the two. 6. 24 hour rehab nursing will assist with bladder management, bowel management, safety, skin/wound care, disease management, medication administration, pain management and patient education  and help integrate therapy concepts, techniques,education, etc. 7. PT will assess and treat for/with: Lower extremity strength, range of motion, stamina, balance, functional mobility, safety, adaptive techniques and equipment, NMR, spasticity mgt, family and care give education.   Goals are: mod assist+. 8. OT will assess and treat for/with: ADL's, functional mobility, safety, upper extremity strength,  adaptive techniques and equipment, pain mgt, spasticity, NMR, family education.   Goals are: moderate+ assist. 9. SLP will assess and treat for/with: speech, swallowing, communication, cognition.  Goals are: mod assist. 10. Case Management and Social Worker will assess and treat for psychological issues and discharge planning. 11. Team conference will be held weekly to assess progress toward goals and to determine barriers to discharge. 12. Patient will receive at least 3 hours of therapy per day at least 5 days per week. 13. ELOS: 20-30 days depending upon progress       14. Prognosis:  good and fair   Medical Problem List and Plan: 1. DVT Prophylaxis/Anticoagulation: Pharmaceutical: Lovenox 2. Pain Management: prn tylenol 3. Mood: continue Prozac for mood stabilization. Patient with cognitive deficits with poor/minimal insight---LCSW to follow for evaluation.  4. Neuropsych: This patient is not capable of making decisions on her own behalf. 5. HTN:  Monitor with bid checks and adjust medications as indicated.  6. Recent LGIB with ABLA: Monitor H/H and stool guaiacs as now on ASA .  7. A fib:  Will monitor heart rate with bid checks. Continue betapace.  8.  Dysphagia: On dysphagia 1, nectar liquids. Decrease IVF to HS to avoid overload. Push nectars to maintain hydration.  9.  Hypokalemia: Dilutional--Will add supplement. 10. Spasticity: PRAFO left lower extremity.  -ROM with therapy  -I am hesitant to start baclofen given her age and cognitive levels. Will continue to reassess.  Ranelle Oyster, MD, [facility] Physical Medicine & Rehabilitation   10/02/2013

## 2013-10-02 NOTE — Progress Notes (Signed)
Speech Language Pathology Treatment: Dysphagia;Cognitive-Linquistic  Patient Details Name: Tina Chambers MRN: [id number] DOB: 11-05-1927 Today's Date: 10/02/2013 Time: 4098-1191 SLP Time Calculation (min): 30 min  Assessment / Plan / Recommendation Clinical Impression  F/u for dysphagia and cognition: pt with improved alertness today.  Provided with purees, nectar-thick and trials of thin liquids.  Thin liquids continue to elicit a cough response, indicative of likely aspiration.  Pt tolerated dysphagia 1/nectars with no signs of airway penetration/aspiration, however, requires moderate verbal/tactile cues to attend to bolus, to initiate and sustain mastication, and to initiate a swallow response.  Significant pocketing and spillage from left oral cavity requires mod cues for attention.    Oriented to person and year; unable to state location or condition precipitating hospitalization.  Improved spontaneous attention to the left.  Poor awareness of deficits and mod assist required for appropriate sustaining and ceasing attention.  Educated son, daughter re: cognition and dysphagia, methods to assist with feeding.     HPI HPI: Tina Chambers is a 79 y.o. female with PMH of HTN, hyperlipidemia, CHF, hypothyroidism, A. Fib (currently not on blood thinner), recent Gi bleeding secondary to peptic ulcer disease, who presents with left-sided weakness. Patient is a resident of assisted living facility ([address]). She has been doing fine until this morning 09/29/2013. She was walking, went to sit down at 8:00 AM. The workers noticed that she wasn't moving or interacting as normal. She was found to have left sided weakness and facial droopy. She could not move her left arm and leg initially. Taken to intervention where an angio showed M1 occlusion which was opened with IA tpa and solitaire. Sheath d/c'd and pt extubated on 11/5. Now with NG tube due to need for meds.CT shows evolving acute  infarct on the right particularly involving the parietal lobe and temporal lobe.       SLP Plan  Continue with current plan of care   Continue dysphagia 1, nectar-thick liquids with full supervision over weekend.  SLP will follow for diet advancement and /or necessity of instrumental swallow study on Monday, 11/10.  (Informed family of plan.) If PO intake is sufficient, consider D/Cing NG feeds.     Recommendations Diet recommendations: Dysphagia 1 (puree);Nectar-thick liquid Liquids provided via: Straw Medication Administration: Whole meds with puree Supervision: Full supervision/cueing for compensatory strategies;Staff to assist with self feeding Compensations: Slow rate;Small sips/bites;Check for pocketing;Check for anterior loss Postural Changes and/or Swallow Maneuvers: Seated upright 90 degrees              Plan: Continue with current plan of care    Kalina Morabito L. Samson Frederic, [place] CCC/SLP Pager [phone]    Blenda Mounts Laurice 10/02/2013, 11:25 AM

## 2013-10-02 NOTE — Progress Notes (Signed)
Rehab admissions - Evaluated for possible admission.  I met with patient, son, daughter and other family at the bedside.  They would like inpatient rehab admission and then hopes to return to ALF.  Bed available and can admit to acute inpatient rehab today.  Call me for questions.  #[phone]

## 2013-10-02 NOTE — Discharge Summary (Signed)
Stroke Discharge Summary  Patient ID: Tina Chambers   MRN: [id number]      DOB: August 05, 1927  Date of Admission: 09/29/2013 Date of Discharge: 10/02/2013  Attending Physician:  Darcella Cheshire, MD, Stroke MD  Consulting Physician(s):  Treatment Team:  Md Stroke, MD rehabilitation medicine, interventional radiology  Patient's PCP:  No primary provider on file.  Discharge Diagnoses:  Principal Problem:   Acute right MCA stroke Active Problems:   Acute ischemic stroke   Acute respiratory failure   Atrial fibrillation   Essential hypertension, benign   Other and unspecified hyperlipidemia   Encounter for long-term (current) use of medications BMI  Body mass index is 23.53 kg/(m^2).  Past Medical History  Diagnosis Date  . Hypertension   . Atrial fibrillation   . CHF (congestive heart failure)   . Hypothyroid   . Hyperlipidemia   . GIB (gastrointestinal bleeding)     recurrent  . Pelvic fracture    History reviewed. No pertinent past surgical history.  Medications to be continued on Rehab . alteplase  59 mg Intravenous Once  . antiseptic oral rinse  15 mL Mouth Rinse BID  . aspirin  300 mg Rectal Daily   Or  . aspirin EC  325 mg Oral Daily  . atorvastatin  10 mg Oral q1800  . darifenacin  7.5 mg Oral Daily  . FLUoxetine  10 mg Oral Daily  . insulin aspart  0-15 Units Subcutaneous Q4H  . [START ON 10/03/2013] levothyroxine  75 mcg Oral QAC breakfast  . pantoprazole sodium  40 mg Per Tube Daily  . sotalol  80 mg Oral Q12H    LABORATORY STUDIES CBC    Component Value Date/Time   WBC 7.7 09/30/2013 0545   RBC 3.74* 09/30/2013 0545   HGB 9.5* 09/30/2013 0545   HCT 30.2* 09/30/2013 0545   PLT 111* 09/30/2013 0545   MCV 80.7 09/30/2013 0545   MCH 25.4* 09/30/2013 0545   MCHC 31.5 09/30/2013 0545   RDW 18.4* 09/30/2013 0545   LYMPHSABS 1.6 09/30/2013 0545   MONOABS 0.7 09/30/2013 0545   EOSABS 0.1 09/30/2013 0545   BASOSABS 0.0 09/30/2013 0545   CMP    Component  Value Date/Time   NA 142 09/30/2013 0545   K 3.3* 09/30/2013 0545   CL 105 09/30/2013 0545   CO2 26 09/30/2013 0545   GLUCOSE 108* 09/30/2013 0545   BUN 14 09/30/2013 0545   CREATININE 0.68 09/30/2013 0545   CALCIUM 9.1 09/30/2013 0545   PROT 6.9 09/29/2013 0853   ALBUMIN 3.5 09/29/2013 0853   AST 28 09/29/2013 0853   ALT 17 09/29/2013 0853   ALKPHOS 98 09/29/2013 0853   BILITOT 0.3 09/29/2013 0853   GFRNONAA 78* 09/30/2013 0545   GFRAA >90 09/30/2013 0545   COAGS Lab Results  Component Value Date   INR 0.87 09/29/2013   Lipid Panel    Component Value Date/Time   CHOL 152 09/30/2013 0545   TRIG 101 09/30/2013 0545   HDL 52 09/30/2013 0545   CHOLHDL 2.9 09/30/2013 0545   VLDL 20 09/30/2013 0545   LDLCALC 80 09/30/2013 0545   HgbA1C  Lab Results  Component Value Date   HGBA1C 5.3 09/30/2013   Cardiac Panel (last 3 results) No results found for this basename: CKTOTAL, CKMB, TROPONINI, RELINDX,  in the last 72 hours Urinalysis    Component Value Date/Time   COLORURINE YELLOW 09/29/2013 0911   APPEARANCEUR CLEAR 09/29/2013 0911   LABSPEC  1.013 09/29/2013 0911   PHURINE 6.5 09/29/2013 0911   GLUCOSEU NEGATIVE 09/29/2013 0911   HGBUR NEGATIVE 09/29/2013 0911   BILIRUBINUR NEGATIVE 09/29/2013 0911   KETONESUR NEGATIVE 09/29/2013 0911   PROTEINUR NEGATIVE 09/29/2013 0911   UROBILINOGEN 0.2 09/29/2013 0911   NITRITE NEGATIVE 09/29/2013 0911   LEUKOCYTESUR NEGATIVE 09/29/2013 0911   Urine Drug Screen     Component Value Date/Time   LABOPIA POSITIVE* 09/29/2013 0911   COCAINSCRNUR NONE DETECTED 09/29/2013 0911   LABBENZ NONE DETECTED 09/29/2013 0911   AMPHETMU NONE DETECTED 09/29/2013 0911   THCU NONE DETECTED 09/29/2013 0911   LABBARB NONE DETECTED 09/29/2013 0911    Alcohol Level    Component Value Date/Time   ETH <11 09/29/2013 0853     SIGNIFICANT DIAGNOSTIC STUDIES CT of the brain  09/29/2013 No hemorrhage or acute infarction.  09/29/2013 No acute intracranial abnormality.  09/30/2013 Evolving  acute infarct on the right. No hemorrhage.  Cerebral angiogram 09/29/2013 S/P RT Common carotid arteriogram followed by complete revascularization(TICI3) of occluded Rt MCA prox using 5mg  of IA tpa and one pass with [employer] Solitaire FR thrombectomy device .  MRI of the brain  1. Acute ischemic right MCA territory infarct involving the right  frontal, temporal and parietal lobes as well as the right basal  ganglia. No acute intracranial hemorrhage.  2. Swelling and edema within the area of infarction within the right  cerebral hemisphere with mild mass effect on the right lateral  ventricle. No midline shift or hydrocephalus.  3. Small 5 mm focal infarct within the high rib left parietal lobe  near the vertex.  4. Diffuse age-related atrophy  MRA of the brain  2D Echocardiogram EF 55%, wall motion normal, LA dilated. No ASD or PFO.  Carotid Doppler See angio  CXR 09/29/2013 1. Tip of endotracheal tube 3.7 cm above the carina. 2. Mild bibasilar atelectasis with no radiographic evidence of acute cardiopulmonary process.     History of Present Illness    Tina Chambers is a 79 y.o. female with PMH of HTN, hyperlipidemia, CHF, hypothyroidism, A. Fib (currently not on blood thinner), recent Gi bleeding secondary to peptic ulcer disease, who presents with left-sided weakness.  Patient is a resident of assisted living facility ([address] She has been doing fine until this morning 09/29/2013. She was walking, went to sit down at 8:00 AM. The workers noticed that she wasn't moving or interacting as normal. She was found to have left sided weakness and facial droopy. She could not move her left arm and leg initially. EMS was called and took patient to ED. She was found to have right sided gaze, left facial droopy and left arm weakness in ED. She moves both her legs well. She is interactive for interview and answers questions with slurry speech. Stat CT head has no intracranial bleeding. Her blood  pressure is 142/66, heart rate 90, oxygen saturation 94% on room air. Of note, patient has hx GI bleeding secondary to peptic ulcer disease. Most recent GI bleeding was proximately 2 weeks ago. She has a. fib, and was blood thinner before, which was discontinued because of recent GI bleeding.  Patient was not a TPA candidate secondary to recent GIB. Taken to intervention where an angio showed M1 occlusion which was opened with IA tpa and solitaire. She was admitted to the neuro ICU for further evaluation and treatment  Hospital Course  Ms. BRITTNEY MUCHA is a 79 y.o. female presenting with left  sided weakness. Taken to intervention where an angio showed M1 occlusion which was opened with IA tpa and solitaire. Imaging shows acute ischemic right MCA territory infarct involving the right frontal, temporal and parietal lobes as well as the right basal ganglia. No acute intracranial hemorrhage. Infarct felt to be embolic secondary to known atrial fibrillation. On no antithrombotics given hx GIB prior to admission (was on xarelto prior to GIB). Now on rectal aspirin 300mg  daily for secondary stroke prevention. Patient with resultant left hemiparesis, dysphagia.  atrial fibrillation, no on anticoagulation due to recent GIB 2 weeks ago secondary to PUD.  Hypertension  Hyperlipidemia, LDL 80, on Lipitor 10 PTA, now lipitor on hold as NPO, goal LDL < 100 (< 70 for diabetics)  CHF - lasix on hold to allow permissive hypertension  Aug 2014 fractured sacrum, in SNF until last week when she was moved to ALF at [facility]  HGB A1C 5.3 Hospital day # 3  TREATMENT/PLAN  Add aspirin 325 mg orally every day and or aspirin 300 mg suppository for secondary stroke prevention. (Possible switch to eliquis after one week as she has non-valvular atrial fibrillation, will continue aspirin for now)  Continue panda as this is her main source of nourishment.  Patient to transfer to rehab today. Patient does live at  independent living prior to admission   Physical therapy, occupational therapy and speech therapy evaluated patient. All agreed inpatient rehab is needed. Patient's family is/are supportive and can provide care at discharge. CIR bed is available today and patient will be transferred there.  Discharge Exam  Blood pressure 142/58, pulse 81, temperature 98.3 F (36.8 C), temperature source Oral, resp. rate 29, height 5\' 4"  (1.626 m), weight 62.2 kg (137 lb 2 oz), SpO2 99.00%.  Physical Exam  Elderly Caucasian lady who is intubated.Awake alert. Afebrile. Head is nontraumatic. Neck is supple without bruit. l. Cardiac exam no murmur or gallop. Lungs are clear to auscultation. Distal pulses are well felt.  Neurological Exam ; Awake alert and follows simple commands. Right gaze preference but can follow gaze to left till midline only. Does not blink to threat on left. Left lower face weak. Cough and gag present. Spontaneous antigravity movements on right and withdraws mildly to pain on left but has hemiparesis. Plantars mute bilaterally   Discharge Diet  Dysphagia 1 nectar-thick liquids  Discharge Plan  Disposition:  Transfer to [facility] for ongoing PT, OT and ST  aspirin 325 mg orally every day for secondary stroke prevention.  Ongoing risk factor control by Primary Care Physician. Risk factor recommendations:  Hypertension target range 130-140/70-80 Lipid range - LDL < 100 and checked every 6 months, fasting Diabetes - HgB A1C <7   Follow-up with a primary provider in 1 month.  Follow-up with Dr. Delia Heady, Stroke Clinic in 2 months.  35 minutes were spent preparing discharge.  Signed Gwendolyn Lima. Manson Passey, [facility], MBA, MHA [facility] Pager: [phone] 10/02/2013 1:44 PM  I have personally examined this patient, reviewed pertinent data and developed the plan of care. I agree with above.  Delia Heady, MD

## 2013-10-02 NOTE — Progress Notes (Signed)
Stroke Team Progress Note  HISTORY Tina Chambers is a 79 y.o. female with PMH of HTN, hyperlipidemia, CHF, hypothyroidism, A. Fib (currently not on blood thinner), recent Gi bleeding secondary to peptic ulcer disease, who presents with left-sided weakness.  Patient is a resident of assisted living facility ([address] She has been doing fine until this morning 09/29/2013. She was walking, went to sit down at 8:00 AM. The workers noticed that she wasn't moving or interacting as normal. She was found to have left sided weakness and facial droopy. She could not move her left arm and leg initially. EMS was called and took patient to ED. She was found to have right sided gaze, left facial droopy and left arm weakness in ED. She moves both her legs well. She is interactive for interview and answers questions with slurry speech. Stat CT head has no intracranial bleeding. Her blood pressure is 142/66, heart rate 90, oxygen saturation 94% on room air. Of note, patient has hx GI bleeding secondary to peptic ulcer disease. Most recent GI bleeding was proximately 2 weeks ago. She has a. fib, and was blood thinner before, which was discontinued because of recent GI bleeding.   Patient was not a TPA candidate secondary to recent GIB. Taken to intervention where an angio showed M1 occlusion which was opened with IA tpa and solitaire. She was admitted to the neuro ICU for further evaluation and treatment.  SUBJECTIVE Patient lying in bed. Son in room. Plans for CIR   OBJECTIVE Most recent Vital Signs: Filed Vitals:   10/02/13 0400 10/02/13 0500 10/02/13 0600 10/02/13 0700  BP: 90/64  148/73 137/80  Pulse: 77 89 84 96  Temp:    98.3 F (36.8 C)  TempSrc:    Oral  Resp: 23 19 25 31   Height:      Weight:      SpO2: 94% 95% 94% 94%   CBG (last 3)   Recent Labs  10/01/13 2014 10/02/13 0040 10/02/13 0327  GLUCAP 116* 124* 115*    IV Fluid Intake:   . sodium chloride 75 mL/hr at  10/02/13 0600    MEDICATIONS  . alteplase  59 mg Intravenous Once  . antiseptic oral rinse  15 mL Mouth Rinse BID  . aspirin  300 mg Rectal Daily   Or  . aspirin EC  325 mg Oral Daily  . insulin aspart  0-15 Units Subcutaneous Q4H  . pantoprazole sodium  40 mg Per Tube Daily  . sotalol  80 mg Oral Q12H   PRN:  acetaminophen, acetaminophen, labetalol, ondansetron (ZOFRAN) IV, phenol, RESOURCE THICKENUP CLEAR  Diet:  Dysphagia  Activity:  Bedrest DVT Prophylaxis:  SCDs   CLINICALLY SIGNIFICANT STUDIES Basic Metabolic Panel:  Recent Labs Lab 09/29/13 0849  09/29/13 0853 09/29/13 0902 09/30/13 0545  NA  --   < > 138 139 142  K  --   < > 3.4* 3.1* 3.3*  CL  --   < > 96 95* 105  CO2  --   --  28  --  26  GLUCOSE  --   < > 158* 157* 108*  BUN  --   < > 22 26* 14  CREATININE  --   < > 0.85 1.10 0.68  CALCIUM  --   --  9.8  --  9.1  MG 1.6  --   --   --   --   < > = values in this interval not  displayed. Liver Function Tests:   Recent Labs Lab 09/29/13 0853  AST 28  ALT 17  ALKPHOS 98  BILITOT 0.3  PROT 6.9  ALBUMIN 3.5   CBC:   Recent Labs Lab 09/29/13 0853 09/29/13 0902 09/30/13 0545  WBC 6.3  --  7.7  NEUTROABS 3.6  --  5.3  HGB 10.1* 12.2 9.5*  HCT 32.8* 36.0 30.2*  MCV 80.2  --  80.7  PLT PLATELET CLUMPS NOTED ON SMEAR, UNABLE TO ESTIMATE  --  111*   Coagulation:   Recent Labs Lab 09/29/13 0853  LABPROT 11.7  INR 0.87   Cardiac Enzymes:   Recent Labs Lab 09/29/13 0853  TROPONINI <0.30   Urinalysis:   Recent Labs Lab 09/29/13 0911  COLORURINE YELLOW  LABSPEC 1.013  PHURINE 6.5  GLUCOSEU NEGATIVE  HGBUR NEGATIVE  BILIRUBINUR NEGATIVE  KETONESUR NEGATIVE  PROTEINUR NEGATIVE  UROBILINOGEN 0.2  NITRITE NEGATIVE  LEUKOCYTESUR NEGATIVE   Lipid Panel    Component Value Date/Time   CHOL 152 09/30/2013 0545   TRIG 101 09/30/2013 0545   HDL 52 09/30/2013 0545   CHOLHDL 2.9 09/30/2013 0545   VLDL 20 09/30/2013 0545   LDLCALC 80  09/30/2013 0545   HgbA1C  Lab Results  Component Value Date   HGBA1C 5.3 09/30/2013    Urine Drug Screen:     Component Value Date/Time   LABOPIA POSITIVE* 09/29/2013 0911   COCAINSCRNUR NONE DETECTED 09/29/2013 0911   LABBENZ NONE DETECTED 09/29/2013 0911   AMPHETMU NONE DETECTED 09/29/2013 0911   THCU NONE DETECTED 09/29/2013 0911   LABBARB NONE DETECTED 09/29/2013 0911    Alcohol Level:   Recent Labs Lab 09/29/13 0853  ETH <11   CT of the brain   09/29/2013   No hemorrhage or acute infarction.    09/29/2013    No acute intracranial abnormality.  09/30/2013    Evolving acute infarct on the right. No hemorrhage.   Cerebral angiogram  09/29/2013 S/P RT Common carotid arteriogram followed by complete revascularization(TICI3) of occluded Rt MCA prox using 5mg  of IA tpa and one pass with The Solitaire FR thrombectomy device .  MRI of the brain   1. Acute ischemic right MCA territory infarct involving the right  frontal, temporal and parietal lobes as well as the right basal  ganglia. No acute intracranial hemorrhage.  2. Swelling and edema within the area of infarction within the right  cerebral hemisphere with mild mass effect on the right lateral  ventricle. No midline shift or hydrocephalus.  3. Small 5 mm focal infarct within the high rib left parietal lobe  near the vertex.  4. Diffuse age-related atrophy  MRA of the brain    2D Echocardiogram  EF 55%, wall motion normal, LA dilated. No ASD or PFO.   Carotid Doppler  See angio  CXR  09/29/2013   1. Tip of endotracheal tube 3.7 cm above the carina. 2. Mild bibasilar atelectasis with no radiographic evidence of acute cardiopulmonary process.    EKG  Sinus rhythm. Multiple premature complexes, vent & supraven. Minimal ST depression, inferior leads. Prolonged QT interval. Baseline wander in lead(s) V2  Therapy Recommendations   Physical Exam   Elderly Caucasian lady who is intubated.Awake alert. Afebrile. Head is nontraumatic.  Neck is supple without bruit.  l. Cardiac exam no murmur or gallop. Lungs are clear to auscultation. Distal pulses are well felt. Neurological Exam ;  Awake alert and follows simple commands. Right gaze preference but can follow gaze  to left . Does not blink to threat on left. Left lower face weak. Cough and gag present but weak. Spontaneous antigravity movements on right and withdraws mildly to pain on left but has hemiparesis. Plantars mute bilaterally.  ASSESSMENT Tina Chambers is a 79 y.o. female presenting with left sided weakness. Taken to intervention where an angio showed M1 occlusion which was opened with IA tpa and solitaire. Imaging shows acute ischemic right MCA territory infarct involving the right frontal, temporal and parietal lobes as well as the right basal ganglia. No acute intracranial hemorrhage. Infarct felt to be embolic secondary to known atrial fibrillation.  On no antithrombotics given hx GIB prior to admission (was on xarelto prior to GIB). Now on rectal aspirin 300mg  daily for secondary stroke prevention. Patient with resultant VDRF, left hemiparesis, dysphagia.   atrial fibrillation, no on anticoagulation due to recent GIB 2 weeks ago secondary to PUD. Has seen Dr. Beverely Pace in [place] in the past. Now that she lives in [place], they are interested in getting a new cardiologist. Hypertension Hyperlipidemia, LDL 80, on Lipitor 10 PTA, now lipitor on hold as NPO, goal LDL < 100 (< 70 for diabetics) CHF - lasix on hold to allow permissive hypertension Aug 2014 fractured sacrum, in SNF until last week when she was moved to ALF at [facility] HGB A1C  5.3  Hospital day # 3  TREATMENT/PLAN  Add aspirin 325 mg orally every day and or aspirin 300 mg suppository for secondary stroke prevention. (Possible switch to eliquis after one week as she has non-valvular atrial fibrillation, will continue aspirin for now)  Continue panda as this is her main source of nourishment.  CIR  consult. Patient does live at independent living.  Transfer out of unit. May go to CIR when bed available.  Risk factor modification  Gwendolyn Lima. Manson Passey, [facility], MBA, MHA [facility] Pager: [phone] 10/02/2013 8:06 AM  I have personally obtained a history, examined the patient, evaluated imaging results, and formulated the assessment and plan of care. I agree with the above.  Delia Heady, MD

## 2013-10-02 NOTE — Progress Notes (Signed)
Physical Therapy Treatment Note   10/02/13 1700  PT Visit Information  Last PT Received On 10/02/13  Assistance Needed +2  History of Present Illness Tina Chambers is a 79 y.o. female with PMH of HTN, hyperlipidemia, CHF, hypothyroidism, A. Fib (currently not on blood thinner), recent Gi bleeding secondary to peptic ulcer disease, who presents with left-sided weakness. Patient is a resident of assisted living facility ([address] She has been doing fine until this morning 09/29/2013. She was walking, went to sit down at 8:00 AM. The workers noticed that she wasn't moving or interacting as normal. She was found to have left sided weakness and facial droopy. She could not move her left arm and leg initially. Taken to intervention where an angio showed M1 occlusion which was opened with IA tpa and solitaire. Sheath d/c'd and pt extubated on 11/5. Now with NG tube due to need for meds.CT shows evolving acute infarct on the right particularly involving the parietal lobe and temporal lobe.   PT Time Calculation  PT Start Time 1456  PT Stop Time 1527  PT Time Calculation (min) 31 min  Subjective Data  Subjective "I need to get the nametag and check out at [employer]"  Patient Stated Goal unable to stae  Precautions  Precautions Fall  Restrictions  Weight Bearing Restrictions No  Cognition  Arousal/Alertness Awake/alert  Behavior During Therapy Flat affect  Overall Cognitive Status Impaired/Different from baseline  Area of Impairment Attention;Memory;Following commands;Safety/judgement;Awareness;Problem solving;Orientation  Orientation Level Disoriented to;Time  Current Attention Level Sustained  Memory Decreased short-term memory  Following Commands Follows multi-step commands inconsistently  Safety/Judgement Decreased awareness of deficits  Awareness Intellectual  Problem Solving Slow processing;Difficulty sequencing;Requires verbal cues;Requires tactile cues  General Comments  Pt with left sided neglect and difficulty passin midline.  Pt able track one time past midline after several trials during evaluation.   Bed Mobility  Bed Mobility Supine to Sit;Sitting - Scoot to Edge of Bed  Supine to Sit 1: +2 Total assist;With rails  Supine to Sit: Patient Percentage 30%  Sitting - Scoot to Edge of Bed 2: Max assist;With rail  Details for Bed Mobility Assistance (A) to elevate trunk OOB and (A) to advance LE OOB with max cues for technique.   Transfers  Transfers Sit to Stand;Stand to Sit;Stand Pivot Transfers  Sit to Stand 1: +2 Total assist;From elevated surface;From bed  Sit to Stand: Patient Percentage 40%  Stand to Sit 1: +2 Total assist;To chair/3-in-1  Stand to Sit: Patient Percentage 50%  Details for Transfer Assistance +2 (A) to initiate transfer and slowly descend to recliner with max cues.  (A) to advance hips to recliner and prevent pushing to the left side.   Ambulation/Gait  Ambulation/Gait Assistance Not tested (comment)  Modified Rankin (Stroke Patients Only)  Pre-Morbid Rankin Score 4  Modified Rankin 5  Balance  Balance Assessed Yes  Static Sitting Balance  Static Sitting - Balance Support Feet supported  Static Sitting - Level of Assistance 2: Max assist  Static Sitting - Comment/# of Minutes Pt continues to be right bias and needs max cues to promote midline  PT - End of Session  Equipment Utilized During Treatment Gait belt  Activity Tolerance Patient limited by lethargy  Patient left in bed;with call bell/phone within reach  Nurse Communication Mobility status  PT - Assessment/Plan  PT Plan Current plan remains appropriate  PT Frequency Min 4X/week  Recommendations for Other Services Rehab consult  Follow Up Recommendations CIR;Supervision/Assistance -  24 hour  PT equipment None recommended by PT  PT Goal Progression  Progress towards PT goals Progressing toward goals  Acute Rehab PT Goals  PT Goal Formulation With family  Time For  Goal Achievement 10/15/13  Potential to Achieve Goals Good  PT General Charges  $$ ACUTE PT VISIT 1 Procedure  PT Treatments  $Therapeutic Activity 8-22 mins  $Neuromuscular Re-education 8-22 mins   [place], [place] DPT [phone]

## 2013-10-02 NOTE — H&P (View-Only) (Signed)
Physical Medicine and Rehabilitation Admission H&P    Chief Complaint  Patient presents with  . Left sided weakness, facial droop, slurred speech    HPI: Tina Chambers is a 79 y.o. female with history of HTN, recent pelvic fracture, A Fib--off blood thinners due to GIB (most recent ~ 2 weeks ago), CHF who was admitted on 09/29/13 past being found with facial droop, right gaze preference and left sided weakness. CCT without evidence of bleed and patient underwent cerebral angiogram with complete revascularization of occluded R-MCA with IA tpa and thrombectomy with Solitaire device. Follow up CCT with evolving acute infarct R-MCA territory involving parietal and temporal lobe. 2D echo with EF 50-55% and no wall abnormality, grade 1 diastolic dysfunction. Carotid dopplers with 40-59% R-ICA stenosis and 1- 39% L-ICA stenosis. Extubated on 11/05 and started on D 1, nectar thick liquids due to significant pocketing with spillage as well as decrease LOC.  Patient continues with left sided weakness, left inattention with right gaze preference, poor short term memory, poor attention to tasks. Therapies evaluations done and CIR recommended by team.    Review of Systems  Unable to perform ROS: mental acuity   Past Medical History  Diagnosis Date  . Hypertension   . Atrial fibrillation   . CHF (congestive heart failure)   . Hypothyroid   . Hyperlipidemia    History reviewed. No pertinent past surgical history.  History reviewed. No pertinent family history.   Social History:  Moved to [place] recently- Was in SNF till a week PTA ( Aug-a week PTA) for pelvic fracture. Per report she has never smoked. She does not have any smokeless tobacco history on file. Per reports that she does not drink alcohol or use illicit drugs.  Allergies  Allergen Reactions  . Phenergan [Promethazine Hcl]     From [facility]   Medications Prior to Admission  Medication Sig Dispense Refill  . atorvastatin  (LIPITOR) 10 MG tablet Take 10 mg by mouth daily.      . diltiazem (DILACOR XR) 180 MG 24 hr capsule Take 180 mg by mouth daily.      . ferrous sulfate 325 (65 FE) MG tablet Take 325 mg by mouth 2 (two) times daily with a meal.      . FLUoxetine (PROZAC) 10 MG capsule Take 10 mg by mouth daily.      . HYDROcodone-acetaminophen (NORCO/VICODIN) 5-325 MG per tablet Take 1 tablet by mouth daily.      . HYDROcodone-acetaminophen (NORCO/VICODIN) 5-325 MG per tablet Take 1 tablet by mouth every 4 (four) hours as needed for moderate pain.      . levothyroxine (SYNTHROID, LEVOTHROID) 75 MCG tablet Take 75 mcg by mouth daily before breakfast.      . omeprazole (PRILOSEC) 20 MG capsule Take 20 mg by mouth daily.      . OVER THE COUNTER MEDICATION Take 2 capsules by mouth 2 (two) times daily. "Probiotic Intestinal 50-500-100"      . solifenacin (VESICARE) 10 MG tablet Take 10 mg by mouth daily.      . sotalol (BETAPACE) 80 MG tablet Take 80 mg by mouth 2 (two) times daily.      . temazepam (RESTORIL) 15 MG capsule Take 15 mg by mouth at bedtime as needed for sleep.      . torsemide (DEMADEX) 10 MG tablet Take 10 mg by mouth daily.      . traMADol (ULTRAM) 50 MG tablet Take by mouth 3 (three) times   daily as needed for moderate pain.        Home: Home Living Family/patient expects to be discharged to:: Assisted living Living Arrangements: Other (Comment) (prior to ALF pt was living alone) Type of Home: Assisted living Home Equipment: Walker - 2 wheels Additional Comments: Pt recently at SNF for sacrum fx and able to improve to d/c to ALF.  Family is uncertain of overall discharge plans this session however are very interested in inpatient rehab   Functional History: Prior Function Vocation: Retired Comments: ambulating with RW  Functional Status:  Mobility: Bed Mobility Bed Mobility: Supine to Sit;Sitting - Scoot to Edge of Bed Supine to Sit: 1: +2 Total assist;With rails Supine to Sit: Patient  Percentage: 30% Sitting - Scoot to Edge of Bed: 2: Max assist;With rail Transfers Transfers: Sit to Stand;Stand to Sit;Stand Pivot Transfers Sit to Stand: 1: +2 Total assist;From elevated surface;From bed Sit to Stand: Patient Percentage: 40% Stand to Sit: 1: +2 Total assist;To chair/3-in-1 Stand to Sit: Patient Percentage: 50% Stand Pivot Transfers: 1: +2 Total assist Stand Pivot Transfers: Patient Percentage: 60% Ambulation/Gait Ambulation/Gait Assistance: Not tested (comment)    ADL:    Cognition: Cognition Overall Cognitive Status: Impaired/Different from baseline Arousal/Alertness: Awake/alert Orientation Level: Oriented to person;Disoriented to time;Disoriented to situation;Oriented to place Attention: Focused;Sustained Focused Attention: Appears intact Sustained Attention: Impaired Sustained Attention Impairment: Functional basic;Verbal basic Memory: Impaired Memory Impairment: Decreased short term memory Decreased Short Term Memory: Verbal basic;Functional basic Awareness: Impaired Awareness Impairment: Intellectual impairment;Emergent impairment Problem Solving: Appears intact Behaviors: Perseveration Safety/Judgment: Impaired Cognition Arousal/Alertness: Awake/alert Behavior During Therapy: Flat affect Overall Cognitive Status: Impaired/Different from baseline Area of Impairment: Attention;Memory;Following commands;Safety/judgement;Awareness;Problem solving;Orientation Orientation Level: Disoriented to;Time Current Attention Level: Sustained Memory: Decreased short-term memory Following Commands: Follows multi-step commands inconsistently Safety/Judgement: Decreased awareness of deficits Awareness: Intellectual Problem Solving: Slow processing;Difficulty sequencing;Requires verbal cues;Requires tactile cues General Comments: Pt with left sided neglect and difficulty passin midline.  Pt able track one time past midline after several trials during evaluation.       Blood pressure 100/67, pulse 84, temperature 98.3 F (36.8 C), temperature source Oral, resp. rate 27, height 5' 4" (1.626 m), weight 62.2 kg (137 lb 2 oz), SpO2 100.00%. Physical Exam Constitutional: She appears well-developed and well-nourished.  HENT: NGT in place Head: Normocephalic and atraumatic.  Eyes: Conjunctivae and EOM are normal. Pupils are equal, round, and reactive to light.  Neck: Normal range of motion. Neck supple.  Cardiovascular: Normal rate, regular rhythm and normal heart sounds. No murmurs No murmur heard.  Respiratory: Effort normal and breath sounds normal. No respiratory distress. She has no wheezes. She has no rales.  GI: Soft. Bowel sounds are normal. She exhibits no distension. There is no tenderness. There is no rebound.  Neurological:  Left tongue deviation and facial droop. She exhibits abnormal muscle tone. She displays no seizure activity.  Very distracted. Speaks incoherently in small sentences and phrases. Does follow simple one step commands with repetition. Oriented to self only. Mitt on right hand. She attempts to bit mitten to remove from hand. Constantly grabbing at objects in bed with mitten on.  Reflex Scores:  Tricep reflexes are 3+ on the left side.  Bicep reflexes are 3+ on the left side.  Brachioradialis reflexes are 3+ on the left side.  Patellar reflexes are 3+ on the left side.  Achilles reflexes are 3+ on the left side. Flexor withdrawal spasticity left lower extremity, sustained clonus left ankle Motor strength is 0/5 left   upper extremity trace hip extension left lower extremity Right upper extremity 4+/5 in the deltoid, bicep, tricep, grip right lower extremity 4/5 in the hip flexor knee extensor ankle dorsiflexor plantar flexor Sensation is absent to light touch in the left upper extremity but withdraws to pain Left lower extremity withdrawal to pain, absent light touch Right upper and right lower light touch is intact Patient  appears to have a field cut on the left side but difficult to test. She does turn her head to look at objects on the left side when cued     Results for orders placed during the hospital encounter of 09/29/13 (from the past 48 hour(s))  GLUCOSE, CAPILLARY     Status: Abnormal   Collection Time    09/30/13 12:44 PM      Result Value Range   Glucose-Capillary 109 (*) 70 - 99 mg/dL  GLUCOSE, CAPILLARY     Status: None   Collection Time    09/30/13  4:27 PM      Result Value Range   Glucose-Capillary 86  70 - 99 mg/dL   Comment 1 Notify RN     Comment 2 Documented in Chart    GLUCOSE, CAPILLARY     Status: None   Collection Time    09/30/13  8:32 PM      Result Value Range   Glucose-Capillary 99  70 - 99 mg/dL   Comment 1 Notify RN     Comment 2 Documented in Chart    GLUCOSE, CAPILLARY     Status: Abnormal   Collection Time    10/01/13 12:22 AM      Result Value Range   Glucose-Capillary 111 (*) 70 - 99 mg/dL  GLUCOSE, CAPILLARY     Status: None   Collection Time    10/01/13  3:59 AM      Result Value Range   Glucose-Capillary 98  70 - 99 mg/dL  GLUCOSE, CAPILLARY     Status: None   Collection Time    10/01/13  7:35 AM      Result Value Range   Glucose-Capillary 99  70 - 99 mg/dL  GLUCOSE, CAPILLARY     Status: None   Collection Time    10/01/13 11:59 AM      Result Value Range   Glucose-Capillary 86  70 - 99 mg/dL  GLUCOSE, CAPILLARY     Status: Abnormal   Collection Time    10/01/13  4:09 PM      Result Value Range   Glucose-Capillary 112 (*) 70 - 99 mg/dL   Comment 1 Notify RN     Comment 2 Documented in Chart    GLUCOSE, CAPILLARY     Status: Abnormal   Collection Time    10/01/13  8:14 PM      Result Value Range   Glucose-Capillary 116 (*) 70 - 99 mg/dL   Comment 1 Notify RN     Comment 2 Documented in Chart    GLUCOSE, CAPILLARY     Status: Abnormal   Collection Time    10/02/13 12:40 AM      Result Value Range   Glucose-Capillary 124 (*) 70 - 99 mg/dL   GLUCOSE, CAPILLARY     Status: Abnormal   Collection Time    10/02/13  3:27 AM      Result Value Range   Glucose-Capillary 115 (*) 70 - 99 mg/dL  OCCULT BLOOD X 1 CARD TO LAB, STOOL       Status: None   Collection Time    10/02/13  5:09 AM      Result Value Range   Fecal Occult Bld NEGATIVE  NEGATIVE  GLUCOSE, CAPILLARY     Status: Abnormal   Collection Time    10/02/13  7:36 AM      Result Value Range   Glucose-Capillary 111 (*) 70 - 99 mg/dL  GLUCOSE, CAPILLARY     Status: Abnormal   Collection Time    10/02/13 12:10 PM      Result Value Range   Glucose-Capillary 127 (*) 70 - 99 mg/dL   Ct Head Wo Contrast  09/30/2013   CLINICAL DATA:  Status post interventional case, followup stroke  EXAM: CT HEAD WITHOUT CONTRAST  TECHNIQUE: Contiguous axial images were obtained from the base of the skull through the vertex without intravenous contrast.  COMPARISON:  09/29/2013  FINDINGS: No evidence of hemorrhage or extra-axial fluid. There is decreased attenuation in the white matter of the middle cerebral artery territory on the right, particularly involving the parietal lobe and temporal lobe. There is also decreased sulcation, with mass effect on the right lateral ventricle. No evidence of midline shift. Stable lacunar infarct right thalamus.  IMPRESSION: Evolving acute infarct on the right. No hemorrhage.   Electronically Signed   By: Raymond  Rubner M.D.   On: 09/30/2013 19:05   Mr Brain Wo Contrast  10/01/2013   CLINICAL DATA:  Left facial droop with left arm weakness, recent angio demonstrated right M1 occlusion. Status post recannulization with intra-arterial TPA and solitaire.  EXAM: MRI HEAD WITHOUT CONTRAST  TECHNIQUE: Multiplanar, multisequence MR imaging was performed. No intravenous contrast was administered.  COMPARISON:  Prior CT from 09/30/2013.  FINDINGS: Study is somewhat degraded by motion artifact.  There is extensive restricted diffusion throughout the right MCA territory  involving the right frontal, temporal, and parietal lobes, consistent with acute ischemic infarct. Corresponding signal dropout is seen on the ADC map. Involvement within the temporal lobe is primarily within the insular cortex. Infarct is present within the right basal ganglia including the caudate, detain min, and globus pallidus, compatible with meticulous striate involvement. No infratentorial infarct is identified. A single small 5 mm focus of restricted diffusion is seen within the high left parietal lobe near the vertex (series 4, image 26). There is cortical swelling and edema on T2 weighted sequence. No midline shift or hydrocephalus. Minimal effacement of the right lateral ventricle is present. The basilar cisterns remain widely patent.  Normal intravascular flow voids are seen within the intracranial circulation.  There is no acute intracranial hemorrhage.  Mild diffuse atrophy is present. No mass lesion. No extra-axial fluid collection.  IMPRESSION: 1. Acute ischemic right MCA territory infarct involving the right frontal, temporal and parietal lobes as well as the right basal ganglia. No acute intracranial hemorrhage. 2. Swelling and edema within the area of infarction within the right cerebral hemisphere with mild mass effect on the right lateral ventricle. No midline shift or hydrocephalus. 3. Small 5 mm focal infarct within the high rib left parietal lobe near the vertex. 4. Diffuse age-related atrophy.   Electronically Signed   By: Benjamin  McClintock M.D.   On: 10/01/2013 22:56   Dg Abd Portable 1v  09/30/2013   CLINICAL DATA:  Feeding tube placement  EXAM: PORTABLE ABDOMEN - 1 VIEW  COMPARISON:  Prior radiograph from 09/29/2013.  FINDINGS: The tip of a weighted [place] feeding tube overlies the gastric fundus with tip projecting cephalad.  The   visualized bowel gas pattern is nonobstructive. No acute abnormality identified within the abdomen and pelvis.  Dextroscoliosis with severe multilevel  degenerative disc disease again noted within the lumbar spine.  IMPRESSION: Tip of the [place] feeding tube overlying the gastric fundus and projecting cephalad.   Electronically Signed   By: Benjamin  McClintock M.D.   On: 09/30/2013 23:31    Post Admission Physician Evaluation: 1. Functional deficits secondary  to large thrombotic right mca infarct. 2. Patient is admitted to receive collaborative, interdisciplinary care between the physiatrist, rehab nursing staff, and therapy team. 3. Patient's level of medical complexity and substantial therapy needs in context of that medical necessity cannot be provided at a lesser intensity of care such as a SNF. 4. Patient has experienced substantial functional loss from his/her baseline which was documented above under the "Functional History" and "Functional Status" headings.  Judging by the patient's diagnosis, physical exam, and functional history, the patient has potential for functional progress which will result in measurable gains while on inpatient rehab.  These gains will be of substantial and practical use upon discharge  in facilitating mobility and self-care at the household level. 5. Physiatrist will provide 24 hour management of medical needs as well as oversight of the therapy plan/treatment and provide guidance as appropriate regarding the interaction of the two. 6. 24 hour rehab nursing will assist with bladder management, bowel management, safety, skin/wound care, disease management, medication administration, pain management and patient education  and help integrate therapy concepts, techniques,education, etc. 7. PT will assess and treat for/with: Lower extremity strength, range of motion, stamina, balance, functional mobility, safety, adaptive techniques and equipment, NMR, spasticity mgt, family and care give education.   Goals are: mod assist+. 8. OT will assess and treat for/with: ADL's, functional mobility, safety, upper extremity strength,  adaptive techniques and equipment, pain mgt, spasticity, NMR, family education.   Goals are: moderate+ assist. 9. SLP will assess and treat for/with: speech, swallowing, communication, cognition.  Goals are: mod assist. 10. Case Management and Social Worker will assess and treat for psychological issues and discharge planning. 11. Team conference will be held weekly to assess progress toward goals and to determine barriers to discharge. 12. Patient will receive at least 3 hours of therapy per day at least 5 days per week. 13. ELOS: 20-30 days depending upon progress       14. Prognosis:  good and fair   Medical Problem List and Plan: 1. DVT Prophylaxis/Anticoagulation: Pharmaceutical: Lovenox 2. Pain Management: prn tylenol 3. Mood: continue Prozac for mood stabilization. Patient with cognitive deficits with poor/minimal insight---LCSW to follow for evaluation.  4. Neuropsych: This patient is not capable of making decisions on her own behalf. 5. HTN:  Monitor with bid checks and adjust medications as indicated.  6. Recent LGIB with ABLA: Monitor H/H and stool guaiacs as now on ASA .  7. A fib:  Will monitor heart rate with bid checks. Continue betapace.  8.  Dysphagia: On dysphagia 1, nectar liquids. Decrease IVF to HS to avoid overload. Push nectars to maintain hydration.  9.  Hypokalemia: Dilutional--Will add supplement. 10. Spasticity: PRAFO left lower extremity.  -ROM with therapy  -I am hesitant to start baclofen given her age and cognitive levels. Will continue to reassess.  Shantanu Strauch T. Urvi Imes, MD, FAAPMR [facility]   10/02/2013 

## 2013-10-02 NOTE — Progress Notes (Signed)
Occupational Therapy Evaluation Patient Details Name: Tina Chambers MRN: [id number] DOB: [birth year] Today's Date: 10/02/2013 Time: 8469-6295 OT Time Calculation (min): 37 min  OT Assessment / Plan / Recommendation History of present illness Tina Chambers is a 79 y.o. female with PMH of HTN, hyperlipidemia, CHF, hypothyroidism, A. Fib (currently not on blood thinner), recent Gi bleeding secondary to peptic ulcer disease, who presents with left-sided weakness. Patient is a resident of assisted living facility ([address]). She has been doing fine until this morning 09/29/2013. She was walking, went to sit down at 8:00 AM. The workers noticed that she wasn't moving or interacting as normal. She was found to have left sided weakness and facial droopy. She could not move her left arm and leg initially. Taken to intervention where an angio showed M1 occlusion which was opened with IA tpa and solitaire. Sheath d/c'd and pt extubated on 11/5. Now with NG tube due to need for meds.CT shows evolving acute infarct on the right particularly involving the parietal lobe and temporal lobe.    Clinical Impression   PTA, pt lived in ALF. Unsure of PLOF. PT currently total A with all ADL with significant L neglect, postural, agnosias and cognitive deficits. If ALF will be willing to provide Mod to max A with ADL, then rec CIR. Will follow acutely to address goals.    OT Assessment  Patient needs continued OT Services    Follow Up Recommendations  CIR    Barriers to Discharge      Equipment Recommendations       Recommendations for Other Services Rehab consult  Frequency  Min 3X/week    Precautions / Restrictions Precautions Precautions: Fall Restrictions Weight Bearing Restrictions: No   Pertinent Vitals/Pain Vitals stable.    ADL  Eating/Feeding: +1 Total assistance Where Assessed - Eating/Feeding: Bed level Grooming: +1 Total assistance Where Assessed - Grooming: Supported  sitting Upper Body Bathing: +1 Total assistance Where Assessed - Upper Body Bathing: Supported sitting Lower Body Bathing: +1 Total assistance Where Assessed - Lower Body Bathing: Supported sitting;Lean right and/or left Upper Body Dressing: +1 Total assistance Where Assessed - Upper Body Dressing: Supported sitting Lower Body Dressing: +1 Total assistance Where Assessed - Lower Body Dressing: Supported sitting;Lean right and/or left Toileting - Clothing Manipulation and Hygiene: +2 Total assistance (incontinent of BM) Transfers/Ambulation Related to ADLs: total A +2 ADL Comments: Able to wash face with tactile cues to initiate task    OT Diagnosis: Generalized weakness;Cognitive deficits;Disturbance of vision;Hemiplegia non-dominant side;Apraxia  OT Problem List: Decreased strength;Decreased range of motion;Decreased activity tolerance;Impaired balance (sitting and/or standing);Impaired vision/perception;Decreased coordination;Decreased cognition;Decreased safety awareness;Decreased knowledge of use of DME or AE;Decreased knowledge of precautions;Impaired sensation;Impaired tone;Impaired UE functional use OT Treatment Interventions: Self-care/ADL training;Therapeutic exercise;Neuromuscular education;Energy conservation;DME and/or AE instruction;Cognitive remediation/compensation;Therapeutic activities;Visual/perceptual remediation/compensation;Patient/family education;Balance training   OT Goals(Current goals can be found in the care plan section) Acute Rehab OT Goals Patient Stated Goal: unable to stae OT Goal Formulation: Patient unable to participate in goal setting Time For Goal Achievement: 10/16/13 Potential to Achieve Goals: Good  Visit Information  Last OT Received On: 10/02/13 Assistance Needed: +2 History of Present Illness: Tina Chambers is a 79 y.o. female with PMH of HTN, hyperlipidemia, CHF, hypothyroidism, A. Fib (currently not on blood thinner), recent Gi bleeding  secondary to peptic ulcer disease, who presents with left-sided weakness. Patient is a resident of assisted living facility ([address] She has been doing fine until this morning  09/29/2013. She was walking, went to sit down at 8:00 AM. The workers noticed that she wasn't moving or interacting as normal. She was found to have left sided weakness and facial droopy. She could not move her left arm and leg initially. Taken to intervention where an angio showed M1 occlusion which was opened with IA tpa and solitaire. Sheath d/c'd and pt extubated on 11/5. Now with NG tube due to need for meds.CT shows evolving acute infarct on the right particularly involving the parietal lobe and temporal lobe.        Prior Functioning     Home Living Family/patient expects to be discharged to:: Assisted living Living Arrangements: Other (Comment) (prior to ALF pt was living alone) Type of Home: Assisted living Home Equipment: Walker - 2 wheels Additional Comments: Pt recently at SNF for sacrum fx and able to improve to d/c to ALF.  Family is uncertain of overall discharge plans this session however are very interested in inpatient rehab Prior Function Level of Independence: Independent with assistive device(s) Comments: ambulating with RW Communication Communication: No difficulties Dominant Hand: Right         Vision/Perception Vision - History Baseline Vision: Wears glasses all the time Patient Visual Report: Blurring of vision Vision - Assessment Eye Alignment: Within Functional Limits Vision Assessment: Vision impaired - to be further tested in functional context Additional Comments: Pt with R gaze preference. Will track to midline only. L homonymous hemianopsia Perception Perception: Impaired Inattention/Neglect: Does not attend to left visual field;Does not attend to left side of body Body Scheme: Unable to locate LUE Body Part Identification: impaired Figure Ground:  impaired Spatial Orientation: L neglect. poor awareness of space   Cognition  Cognition Arousal/Alertness: Awake/alert Behavior During Therapy: Flat affect Overall Cognitive Status: Impaired/Different from baseline Area of Impairment: Attention;Memory;Following commands;Safety/judgement;Awareness;Problem solving;Orientation Orientation Level: Disoriented to;Time Current Attention Level: Sustained Memory: Decreased short-term memory Following Commands: Follows multi-step commands inconsistently Safety/Judgement: Decreased awareness of deficits Awareness: Intellectual Problem Solving: Slow processing;Difficulty sequencing;Requires verbal cues;Requires tactile cues General Comments: Pt with left sided neglect and difficulty passin midline.  Pt able track one time past midline after several trials during evaluation.     Extremity/Trunk Assessment Upper Extremity Assessment Upper Extremity Assessment: LUE deficits/detail LUE Deficits / Details: L neglect affecting arm movement. Limb agnosia. appears to demonstrate movment of LUE, however, nonfunctional at this time. LUE Sensation: decreased light touch;decreased proprioception (does not recognize LUE) LUE Coordination: decreased fine motor;decreased gross motor Lower Extremity Assessment Lower Extremity Assessment: LLE deficits/detail LLE Deficits / Details: Difficult to assess due to cognition and left neglect. Pt however with non purposeful movement on left side however no movement on command.  Pt continued to flex LE in bed. Appears to demonstrate LLE apraxia Cervical / Trunk Assessment Cervical / Trunk Assessment: Other exceptions Cervical / Trunk Exceptions: Poor midline orientation. Max A to maintain static sitting EOB     Mobility Bed Mobility Bed Mobility: Supine to Sit;Sitting - Scoot to Edge of Bed Supine to Sit: 1: +2 Total assist;With rails Supine to Sit: Patient Percentage: 30% Sitting - Scoot to Edge of Bed: 2: Max  assist;With rail Details for Bed Mobility Assistance: (A) to elevate trunk OOB and (A) to advance LE OOB with max cues for technique.  Transfers Sit to Stand: 1: +2 Total assist;From elevated surface;From bed Sit to Stand: Patient Percentage: 40% Stand to Sit: 1: +2 Total assist;To chair/3-in-1 Stand to Sit: Patient Percentage: 50% Details for Transfer Assistance: +2 (A)  to initiate transfer and slowly descend to recliner with max cues.  (A) to advance hips to recliner and prevent pushing to the left side.      Exercise     Balance Balance Balance Assessed: Yes Static Sitting Balance Static Sitting - Balance Support: Feet supported Static Sitting - Level of Assistance: 2: Max assist Static Sitting - Comment/# of Minutes: R bias   End of Session OT - End of Session Activity Tolerance: Patient tolerated treatment well Patient left: in bed;with call bell/phone within reach Nurse Communication: Mobility status  GO     Kassy Mcenroe,HILLARY 10/02/2013, 3:57 PM [facility], OTR/L  [phone] 10/02/2013

## 2013-10-02 NOTE — Interval H&P Note (Signed)
Tina Chambers was admitted today to Inpatient Rehabilitation with the diagnosis of right MCA infarct.  The patient's history has been reviewed, patient examined, and there is no change in status.  Patient continues to be appropriate for intensive inpatient rehabilitation.  I have reviewed the patient's chart and labs.  Questions were answered to the patient's satisfaction.  [name] T 10/02/2013, 7:49 PM

## 2013-10-03 ENCOUNTER — Inpatient Hospital Stay (HOSPITAL_COMMUNITY): Payer: Medicare Other | Admitting: Speech Pathology

## 2013-10-03 ENCOUNTER — Inpatient Hospital Stay (HOSPITAL_COMMUNITY): Payer: Medicare Other | Admitting: Occupational Therapy

## 2013-10-03 ENCOUNTER — Inpatient Hospital Stay (HOSPITAL_COMMUNITY): Payer: Medicare Other | Admitting: Physical Therapy

## 2013-10-03 DIAGNOSIS — I10 Essential (primary) hypertension: Secondary | ICD-10-CM

## 2013-10-03 DIAGNOSIS — I6350 Cerebral infarction due to unspecified occlusion or stenosis of unspecified cerebral artery: Secondary | ICD-10-CM

## 2013-10-03 DIAGNOSIS — I4891 Unspecified atrial fibrillation: Secondary | ICD-10-CM

## 2013-10-03 LAB — URINALYSIS, ROUTINE W REFLEX MICROSCOPIC
Bilirubin Urine: NEGATIVE
Hgb urine dipstick: NEGATIVE
Ketones, ur: NEGATIVE mg/dL
Leukocytes, UA: NEGATIVE
Nitrite: NEGATIVE
Protein, ur: 30 mg/dL — AB
Specific Gravity, Urine: 1.017 (ref 1.005–1.030)
pH: 6 (ref 5.0–8.0)

## 2013-10-03 LAB — OCCULT BLOOD X 1 CARD TO LAB, STOOL: Fecal Occult Bld: POSITIVE — AB

## 2013-10-03 NOTE — Progress Notes (Signed)
Patient ID: Tina Chambers, female   DOB: March 02, 1927, 79 y.o.   MRN: [id number]   26/98.  79 year old patient admitted with left-sided weakness secondary to an acute right MCA stroke. She has history of hypertension atrial fibrillation and recent pelvic fracture and GI bleed and anticoagulation therapy had been held. Patient has chronic hypertension and dyslipidemia. Hospital course complicated by acute respiratory failure. Status post extubation on November 5  FOB testing today positive   Review of Systems  Unable to perform ROS: mental acuity     Past Medical History   Diagnosis  Date   .  Hypertension     .  Atrial fibrillation     .  CHF (congestive heart failure)     .  Hypothyroid     .  Hyperlipidemia           Social History:  [place] recently- Was in SNF till a week PTA ( Aug-a week PTA) for pelvic fracture. Per report she has never smoked. She does not have any smokeless tobacco history on file. Per reports that she does not drink alcohol or use illicit drugs.    Allergies   Allergen  Reactions   .  Phenergan [Promethazine Hcl]         From [facility]       Medications Prior to Admission   Medication  Sig  Dispense  Refill   .  atorvastatin (LIPITOR) 10 MG tablet  Take 10 mg by mouth daily.         Marland Kitchen  diltiazem (DILACOR XR) 180 MG 24 hr capsule  Take 180 mg by mouth daily.         .  ferrous sulfate 325 (65 FE) MG tablet  Take 325 mg by mouth 2 (two) times daily with a meal.         .  FLUoxetine (PROZAC) 10 MG capsule  Take 10 mg by mouth daily.         Marland Kitchen  HYDROcodone-acetaminophen (NORCO/VICODIN) 5-325 MG per tablet  Take 1 tablet by mouth daily.         Marland Kitchen  HYDROcodone-acetaminophen (NORCO/VICODIN) 5-325 MG per tablet  Take 1 tablet by mouth every 4 (four) hours as needed for moderate pain.         Marland Kitchen  levothyroxine (SYNTHROID, LEVOTHROID) 75 MCG tablet  Take 75 mcg by mouth daily before breakfast.         .  omeprazole (PRILOSEC) 20 MG capsule  Take  20 mg by mouth daily.         Marland Kitchen  OVER THE COUNTER MEDICATION  Take 2 capsules by mouth 2 (two) times daily. "Probiotic Intestinal 50-500-100"         .  solifenacin (VESICARE) 10 MG tablet  Take 10 mg by mouth daily.         .  sotalol (BETAPACE) 80 MG tablet  Take 80 mg by mouth 2 (two) times daily.         .  temazepam (RESTORIL) 15 MG capsule  Take 15 mg by mouth at bedtime as needed for sleep.         Marland Kitchen  torsemide (DEMADEX) 10 MG tablet  Take 10 mg by mouth daily.         .  traMADol (ULTRAM) 50 MG tablet  Take by mouth 3 (three) times daily as needed for moderate pain.  Blood pressure 100/67, pulse 84, temperature 98.3 F (36.8 C), temperature source Oral, resp. rate 27, height 5\' 4"  (1.626 m), weight 62.2 kg (137 lb 2 oz), SpO2 100.00%. Physical Exam Constitutional: She appears well-developed and well-nourished.   HENT: NGT in place N/C in place Head: Normocephalic and atraumatic.   Eyes: Conjunctivae and EOM are normal. Pupils are equal, round, and reactive to light.   Neck: Normal range of motion. Neck supple.   Cardiovascular: Normal rate, regular rhythm and normal heart sounds. No murmurs No murmur heard.   Respiratory: Effort normal and breath sounds normal. No respiratory distress. She has no wheezes. She has no rales.   GI: Soft. Bowel sounds are normal. She exhibits no distension. There is no tenderness. There is no rebound.  Neurological:  Left ptosis and L HP Extr- no edema; mitten R hand; IVF L arm  Patient Vitals for the past 24 hrs:  BP Temp Temp src Pulse Resp SpO2 Height Weight  10/03/13 0505 157/65 mmHg 98 F (36.7 C) Oral 79 20 100 % - 148 lb 8 oz (67.359 kg)  10/02/13 2348 165/86 mmHg - - 81 - - - -  10/02/13 1941 145/77 mmHg 97.1 F (36.2 C) Oral 77 20 100 % 5\' 5"  (1.651 m) 144 lb (65.318 kg)    Intake/Output Summary (Last 24 hours) at 10/03/13 1610 Last data filed at 10/03/13 0011  Gross per 24 hour  Intake    100 ml  Output    775 ml   Net   -675 ml   Assessment  S/p Acute R MCA stroke HTN-stable A Fib GIB- will check CBC am; empiric PPI (h/o LGIB and ABLA)

## 2013-10-03 NOTE — Progress Notes (Signed)
INITIAL NUTRITION ASSESSMENT  DOCUMENTATION CODES Per approved criteria  -Not Applicable   INTERVENTION:  1. Continue calorie count  2. Recommend start enteral nutrition via NG tube until PO intake increases  Initiate Jevity 1.2 @ 30 ml/hr and increase by 10 ml every 4 hours to goal rate of 70 ml/hr x 20 hours  150 ml H2O flush every 6 hours.   TF regimen provide: 1680 kcal, 77 grams protein, 1135 ml H2O  NUTRITION DIAGNOSIS: Inadequate oral intake related to dysphagia as evidenced by meal completion <25%.   Goal: Pt to meet >/= 90% of their estimated nutrition needs   Monitor:  Diet advancement, PO intake, supplement acceptance, weight trend, labs  Reason for Assessment: MD consult for calorie count  79 y.o. female  Admitting Dx: Acute right MCA stroke  ASSESSMENT: Pt admitted to [facility] 11/4 with R MCA, IV tPA given and thrombectomy. Pt extubated 11/5. Pt started on Dysphagia and has significant pocketing and spillage. Pt transferred to CIR on 11/7.   Consult received for a calorie count.  This am for Breakfast pt had 3 bites.   Visited pt, pt sleepy, she would answer questions but not open her eyes. Pt states that she is not hungry.  Pt has NG tube in place with the tip in the gastric fundus which was placed 11/5. It does not appear that enteral nutrition was ever started. It appears that pt will not be able to meet needs with PO intake unless she makes improvement. No family present.   Spoke with RN who is aware of TF rec's in this RD's note. She will address with MD on call.   Nutrition Focused Physical Exam:  Subcutaneous Fat:  Orbital Region: WNL Upper Arm Region: WNL Thoracic and Lumbar Region: WNL  Muscle:  Temple Region: WNL Clavicle Bone Region: WNL Clavicle and Acromion Bone Region: WNL Scapular Bone Region: WNL Dorsal Hand: WNL Patellar Region: WNL Anterior Thigh Region: WNL Posterior Calf Region: WNL  Edema: not present   Height: Ht Readings  from Last 1 Encounters:  10/02/13 5\' 5"  (1.651 m)    Weight: Wt Readings from Last 1 Encounters:  10/03/13 148 lb 8 oz (67.359 kg)    Ideal Body Weight: 56.8 kg   % Ideal Body Weight: 118%  Wt Readings from Last 10 Encounters:  10/03/13 148 lb 8 oz (67.359 kg)  09/29/13 137 lb 2 oz (62.2 kg)  09/29/13 137 lb 2 oz (62.2 kg)    Usual Body Weight: 137 lb   % Usual Body Weight: >100%  BMI:  Body mass index is 24.71 kg/(m^2).  Estimated Nutritional Needs: Kcal: 1500-1700 Protein: 75-85 grams Fluid: > 1.5 L/day  Skin: no issues noted  Diet Order: Dysphagia 1 with Nectar Thick Liquids Only 3 bites at Breakfast  EDUCATION NEEDS: -No education needs identified at this time   Intake/Output Summary (Last 24 hours) at 10/03/13 0914 Last data filed at 10/03/13 0011  Gross per 24 hour  Intake    100 ml  Output    775 ml  Net   -675 ml    Last BM: 11/8   Labs:   Recent Labs Lab 09/29/13 0849 09/29/13 0853 09/29/13 0902 09/30/13 0545  NA  --  138 139 142  K  --  3.4* 3.1* 3.3*  CL  --  96 95* 105  CO2  --  28  --  26  BUN  --  22 26* 14  CREATININE  --  0.85  1.10 0.68  CALCIUM  --  9.8  --  9.1  MG 1.6  --   --   --   GLUCOSE  --  158* 157* 108*    CBG (last 3)   Recent Labs  10/02/13 0736 10/02/13 1210 10/02/13 1555  GLUCAP 111* 127* 116*    Scheduled Meds: . antiseptic oral rinse  15 mL Mouth Rinse BID  . aspirin  325 mg Oral Daily  . atorvastatin  10 mg Oral q1800  . darifenacin  7.5 mg Oral Daily  . FLUoxetine  10 mg Oral Daily  . levothyroxine  75 mcg Oral QAC breakfast  . pantoprazole sodium  40 mg Per Tube Daily  . potassium chloride  20 mEq Oral BID  . sodium chloride 0.9 % 1,000 mL with potassium chloride 20 mEq infusion   Intravenous Custom  . sotalol  80 mg Oral Q12H    Continuous Infusions:   Past Medical History  Diagnosis Date  . Hypertension   . Atrial fibrillation   . CHF (congestive heart failure)   . Hypothyroid   .  Hyperlipidemia   . GIB (gastrointestinal bleeding)     recurrent  . Pelvic fracture     No past surgical history on file.  Kendell Bane RD, LDN, CNSC [phone] Pager [phone] After Hours Pager

## 2013-10-03 NOTE — Evaluation (Signed)
Occupational Therapy Assessment and Plan & Session Notes  Patient Details  Name: FLORETTE THAI MRN: [id number] Date of Birth: 03/23/1927  OT Diagnosis: abnormal posture, acute pain, cognitive deficits, disturbance of vision, flaccid hemiplegia and hemiparesis, hemiplegia affecting non-dominant side and muscle weakness (generalized) Rehab Potential: Rehab Potential: Fair ELOS: 21-28 days   Today's Date: 10/03/2013  Problem List:  Patient Active Problem List   Diagnosis Date Noted  . Acute right MCA stroke 10/02/2013  . Atrial fibrillation 10/02/2013  . Essential hypertension, benign 10/02/2013  . Other and unspecified hyperlipidemia 10/02/2013  . Encounter for long-term (current) use of medications 10/02/2013  . Acute ischemic stroke 09/29/2013  . Acute respiratory failure 09/29/2013    Past Medical History:  Past Medical History  Diagnosis Date  . Hypertension   . Atrial fibrillation   . CHF (congestive heart failure)   . Hypothyroid   . Hyperlipidemia   . GIB (gastrointestinal bleeding)     recurrent  . Pelvic fracture    Past Surgical History: No past surgical history on file.  Clinical Impression: SHAQUELA WEICHERT is a 79 y.o. female with history of HTN, recent pelvic fracture, A Fib--off blood thinners due to GIB (most recent ~ 2 weeks ago), CHF who was admitted on 09/29/13 past being found with facial droop, right gaze preference and left sided weakness. CCT without evidence of bleed and patient underwent cerebral angiogram with complete revascularization of occluded R-MCA with IA tpa and thrombectomy with Solitaire device. Follow up CCT with evolving acute infarct R-MCA territory involving parietal and temporal lobe. 2D echo with EF 50-55% and no wall abnormality, grade 1 diastolic dysfunction. Carotid dopplers with 40-59% R-ICA stenosis and 1- 39% L-ICA stenosis. Extubated on 11/05 and started on D 1, nectar thick liquids due to significant pocketing with spillage  as well as decrease LOC. Patient continues with left sided weakness, left inattention with right gaze preference, poor short term memory, poor attention to tasks. Therapies evaluations done and CIR recommended by team. Patient transferred to CIR on 10/02/2013 .    Patient currently requires total assist X2 with basic self-care skills secondary to muscle weakness, muscle joint tightness and muscle paralysis, decreased oxygen support, decreased coordination and decreased motor planning, decreased attention to left, left side neglect and decreased motor planning, decreased initiation, decreased attention, decreased awareness, decreased problem solving, decreased safety awareness, decreased memory, decreased sitting balance, decreased standing balance, decreased postural control, hemiplegia and decreased balance strategies.  Prior to hospitalization, patient was living in an ALF according to chart.  Patient will benefit from skilled intervention to increase independence with basic self-care skills. Anticipate patient will require moderate physical assestance and follow up home health vs SNF rehab.  OT - End of Session Activity Tolerance: Tolerates 10 - 20 min activity with multiple rests Endurance Deficit: Yes OT Assessment Rehab Potential: Fair Barriers to Discharge:  (unsure at this time, no family present during evaluation) OT Patient demonstrates impairments in the following area(s): Balance;Cognition;Edema;Endurance;Motor;Nutrition;Pain;Perception;Safety;Sensory;Skin Integrity;Vision OT Basic ADL's Functional Problem(s): Eating;Bathing;Grooming;Dressing;Toileting OT Advanced ADL's Functional Problem(s):  (n/a at this time) OT Transfers Functional Problem(s): Toilet;Tub/Shower OT Additional Impairment(s): Fuctional Use of Upper Extremity OT Plan OT Intensity: Minimum of 1-2 x/day, 45 to 90 minutes OT Frequency: 5 out of 7 days OT Duration/Estimated Length of Stay: 21-28 days OT  Treatment/Interventions: Balance/vestibular training;Community reintegration;Discharge planning;DME/adaptive equipment instruction;Functional electrical stimulation;Functional mobility training;Neuromuscular re-education;Pain management;Psychosocial support;Self Care/advanced ADL retraining;Skin care/wound managment;Splinting/orthotics;Therapeutic Activities;Therapeutic Exercise;UE/LE Strength taining/ROM;Patient/family education;UE/LE Coordination activities;Wheelchair propulsion/positioning;Visual/perceptual remediation/compensation OT Self  Feeding Anticipated Outcome(s): supervision/set-up assist OT Basic Self-Care Anticipated Outcome(s): Mod I OT Toileting Anticipated Outcome(s): Mod I OT Bathroom Transfers Anticipated Outcome(s): Mod I OT Recommendation Recommendations for Other Services:  (none at this time) Patient destination: Home (home vs SNF depending on family situation) Follow Up Recommendations: Home health OT;Skilled nursing facility (HHOT vs SNF rehab) Equipment Details: TBD  Precautions/Restrictions  Precautions Precautions: Fall Restrictions Weight Bearing Restrictions: No  General Chart Reviewed: Yes Family/Caregiver Present: No  Vital Signs Therapy Vitals Temp: 98 F (36.7 C) Temp src: Oral Pulse Rate: 79 Resp: 20 BP: 157/65 mmHg Patient Position, if appropriate: Lying Oxygen Therapy SpO2: 100 % O2 Flow Rate (L/min): 2 L/min  Home Living/Prior Functioning Home Living Additional Comments: Pt recently at SNF for sacrum fx and able to improve to d/c to ALF.  Family is uncertain of overall discharge plans (previously written in chart) No family present to confirm home living and prior living situaltions. Patient is unable to give information.  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 To be further assessed  Perception Perception: Impaired Inattention/Neglect: Does not attend to left visual field;Does not attend to left side of body Body Scheme: Unable to locate LUE Body Part Identification: impaired Figure Ground: impaired Spatial Orientation: L neglect. poor awareness of space   Cognition Please see SLP eval for more detail regarding patient's cognitive status.  Overall Cognitive Status: Impaired/Different from baseline Arousal/Alertness: Awake/alert (fluctuating level of arousal throughout evaluation) Orientation Level: Oriented to person;Disoriented to place;Disoriented to time;Disoriented to situation (fluctuates) Sustained Attention: Impaired Memory: Impaired Memory Impairment: Decreased short term memory Awareness: Impaired Problem Solving: Impaired Safety/Judgment: Impaired  Sensation Sensation Additional Comments: Patient unable to identify light touch to bilateral UEs Coordination Gross Motor Movements are Fluid and Coordinated: No (LUE) Fine Motor Movements are Fluid and Coordinated: No (LUE) Coordination and Movement Description: Patient with flacid LUE/hand  Motor  Motor Motor: Hemiplegia;Abnormal tone  Mobility  Bed Mobility Bed Mobility: Rolling Left;Sit to Supine Rolling Left: 2: Max assist;1: +1 Total assist;With rail Supine to Sit: 1: +2 during ADL Sit to Supine: 1: +2 during ADL  Trunk/Postural Assessment  Postural Control Postural Control: Deficits on evaluation   Balance Balance Balance Assessed: Yes Static Sitting Balance Static Sitting - Balance Support: Feet supported;Right upper extremity supported Static Sitting - Level of Assistance: total assist during ADL  Extremity/Trunk Assessment RUE Assessment RUE Assessment: Within Functional Limits LUE  Assessment LUE Assessment: Exceptions to WFL LUE AROM (degrees) LUE Overall AROM Comments: Patient with flacid LUE, no active movement in shoulder, elbow, wrist, or fingers.  LUE PROM (degrees) LUE Overall PROM Comments: PROM is WFL; minimal scapular movements noted during passive shoulder flexion  FIM:  FIM - Grooming Grooming: 1: Patient completes 0 of 4 or 1 of 5 steps, or requires 2 helpers FIM - Bathing Bathing: 1: Two helpers FIM - Upper Body Dressing/Undressing Upper body dressing/undressing: 1: Two helpers FIM - Lower Body Dressing/Undressing Lower body dressing/undressing: 1: Two helpers FIM - Toileting Toileting: 0: Activity did not occur FIM - Banker Devices: HOB elevated;Arm rests;Bed rails Bed/Chair Transfer: 1: Supine > Sit: Total A (helper does all/Pt. < 25%);1: Sit > Supine: Total A (helper  does all/Pt. < 25%);1: Chair or W/C > Bed: Total A (helper does all/Pt. < 25%);1: Bed > Chair or W/C: Total A (helper does all/Pt. < 25%) FIM - Archivist Transfers: 0-Activity did not occur FIM - Tub/Shower Transfers Tub/shower Transfers: 0-Activity did not occur or was simulated   Refer to Care Plan for Long Term Goals  Recommendations for other services: None at this time.   Discharge Criteria: Patient will be discharged from OT if patient refuses treatment 3 consecutive times without medical reason, if treatment goals not met, if there is a change in medical status, if patient makes no progress towards goals or if patient is discharged from hospital.  The above assessment, treatment plan, treatment alternatives and goals were discussed and mutually agreed upon: No family available/patient unable  -------------------------------------------------------------------------------------------------------------------------------  SESSION NOTES  Session #1 [phone] - 60 Minutes Individual Therapy Patient with "Little" pain  in lower back, no rate given; RN aware Initial 1:1 occupational therapy evaluation completed. Focused skilled intervention on bed mobility, UB/LB dressing, supine->sit, static sitting balance/tolerance/endurance, attention -> left side/LUE, initiation, overall attention, and overall activity tolerance/endurance. Patient with generalized fatigue during session and had a hard time staying awake towards middle-end of session. Patient with small incontinent BM that was very dark; notified NT of this. Patient required total assist X2 for BADL tasks, bed mobililty, and total assist to maintain static sitting balance. At end of session, left patient supine in bed with call bell within reach and LUE elevated on pillow.   Session #2 1230-1300 - 30 Minutes Individual Therapy No complaints of pain Patient found supine in bed with HOB slightly raised and granddaughter present. Therapist set-up patient to work on self-feeding. Therapist raised HOB and placed bed side table in front of patient. From here, patient worked on feeding self using right hand. Patient pocketed food and held food in mouth for much more than a reasonable amount of time. Patient would not follow one step commands to swallow food in mouth. In middle of session 3 more family members came in and patient was very distracted with family. Therapist notified nursing of patient's difficulty with swallowing and self-feeding. Requested food tray be saved for SLP evaluation. At end of session, left patient supine in bed with HOB raised and call bell within reach. Family still present at bed side.   Tedra Coppernoll 10/03/2013, 12:16 PM

## 2013-10-03 NOTE — Evaluation (Signed)
Physical Therapy Assessment and Plan  Patient Details  Name: Tina Chambers MRN: [id number] Date of Birth: [birth year]  PT Diagnosis: Abnormal posture, Abnormality of gait, Hemiplegia non-dominant, Impaired cognition and Muscle weakness Rehab Potential: Fair ELOS: 21 to 28 days   Today's Date: 10/03/2013 Time: 4098-1191 Time Calculation (min): 58 min  Problem List:  Patient Active Problem List   Diagnosis Date Noted  . Acute right MCA stroke 10/02/2013  . Atrial fibrillation 10/02/2013  . Essential hypertension, benign 10/02/2013  . Other and unspecified hyperlipidemia 10/02/2013  . Encounter for long-term (current) use of medications 10/02/2013  . Acute ischemic stroke 09/29/2013  . Acute respiratory failure 09/29/2013    Past Medical History:  Past Medical History  Diagnosis Date  . Hypertension   . Atrial fibrillation   . CHF (congestive heart failure)   . Hypothyroid   . Hyperlipidemia   . GIB (gastrointestinal bleeding)     recurrent  . Pelvic fracture    Past Surgical History: No past surgical history on file.  Assessment & Plan Clinical Impression: Pt is  a 79 y.o. female with history of HTN, recent pelvic fracture, A Fib--off blood thinners due to GIB (most recent ~ 2 weeks ago), CHF who was admitted on 09/29/13 past being found with facial droop, right gaze preference and left sided weakness. CCT without evidence of bleed and patient underwent cerebral angiogram with complete revascularization of occluded R-MCA with IA tpa and thrombectomy with Solitaire device. Follow up CCT with evolving acute infarct R-MCA territory involving parietal and temporal lobe. 2D echo with EF 50-55% and no wall abnormality, grade 1 diastolic dysfunction. Carotid dopplers with 40-59% R-ICA stenosis and 1- 39% L-ICA stenosis. Extubated on 11/05 and started on D 1, nectar thick liquids due to significant pocketing with spillage as well as decrease LOC. Patient continues with left sided  weakness, left inattention with right gaze preference, poor short term memory, poor attention to tasks.    Patient transferred to CIR on 10/02/2013 .   Patient currently requires total with mobility secondary to muscle weakness and muscle paralysis, decreased cardiorespiratoy endurance, abnormal tone and decreased coordination and left side neglect.  Prior to hospitalization, patient was modified independent  with mobility, pt was living in ALF according to chart. Exact PLOF and discharge plan to be obtained from family.    Patient will benefit from skilled PT intervention to maximize safe functional mobility, minimize fall risk and decrease caregiver burden for planned discharge home with 24 hour assist.  Anticipate patient will benefit from follow up [facility] at discharge.  PT - End of Session Activity Tolerance: Tolerates < 10 min activity, no significant change in vital signs Endurance Deficit: Yes PT Assessment Rehab Potential: Fair Barriers to Discharge: Inaccessible home environment;Decreased caregiver support PT Patient demonstrates impairments in the following area(s): Balance;Endurance;Motor;Safety;Perception;Sensory PT Transfers Functional Problem(s): Bed Mobility;Bed to Chair;Car PT Locomotion Functional Problem(s): Ambulation;Wheelchair Mobility;Stairs PT Plan PT Intensity: Minimum of 1-2 x/day ,45 to 90 minutes PT Frequency: 5 out of 7 days PT Duration Estimated Length of Stay: 21 to 28 days PT Treatment/Interventions: Ambulation/gait training;Discharge planning;DME/adaptive equipment instruction;Balance/vestibular training;Functional electrical stimulation;Functional mobility training;Patient/family education;Neuromuscular re-education;Splinting/orthotics;Therapeutic Exercise;UE/LE Coordination activities;Therapeutic Activities;UE/LE Strength taining/ROM;Wheelchair propulsion/positioning;Stair training PT Transfers Anticipated Outcome(s): min A for transfers PT Locomotion Anticipated  Outcome(s): S for w/c mobility, mod A for ambulation PT Recommendation Follow Up Recommendations: Other (comment) (to be determined based on progress with therapy ) Equipment Recommended: Wheelchair cushion (measurements);Wheelchair (measurements);Other (comment) (other equipment to be determined as  pt progresses)  Skilled Therapeutic Intervention Pt was seen bedside in the am. PT evaluation completed and treatment plan initiated. Pt transferred edge of bed to w/c, w/c to edge of bed to R side with max to total A. Pt tolerated edge of bed about 5 minutes with max A, pt able to maintain sitting balance for brief periods with min guard to min A.   PT Evaluation Precautions/Restrictions Precautions Precautions: Fall Precaution Comments: L neglect Restrictions Weight Bearing Restrictions: No General  Pain Pain Assessment Pain Assessment: No/denies pain Home Living/Prior Functioning Home Living Additional Comments: As per pt she was living in 1 story home with 7 steps to enter through the back, pt was ambulating with cane. As per chart recent SNF for sacral fx with discharge to ALF. No family present at time of evaluation to confirm PLOF and possible discharge plans.   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 To be further assessed  Perception Perception: Impaired Inattention/Neglect: Does not attend to left visual field;Does not attend to left side of body Body Scheme: Unable to locate LUE Body Part Identification: impaired Figure Ground: impaired Spatial Orientation: L  neglect  Cognition Overall Cognitive Status: Impaired/Different from baseline Arousal/Alertness: Awake/alert (fluctuating level of arousal throughout evaluation) Orientation Level: Oriented to person;Disoriented to place;Disoriented to time;Disoriented to situation (fluctuates) Sustained Attention: Impaired Memory: Impaired Memory Impairment: Decreased short term memory Awareness: Impaired Problem Solving: Impaired Safety/Judgment: Impaired Sensation Sensation Light Touch: Impaired by gross assessment Proprioception: Impaired by gross assessment Additional Comments: Patient unable to identify light touch to bilateral UEs Coordination Gross Motor Movements are Fluid and Coordinated: No Fine Motor Movements are Fluid and Coordinated: No Coordination and Movement Description: Patient with flacid LUE/hand Motor  Motor Motor: Hemiplegia;Abnormal tone  Mobility Bed Mobility Bed Mobility: Rolling Left;Sit to Supine Rolling Left: 2: Max assist;1: +1 Total assist;With rail Supine to Sit: 1: +1 Total assist;HOB elevated;With rails Sit to Supine: 1: +1 Total assist;HOB elevated Transfers Stand Pivot Transfers: With armrests;1: +1 Total assist Locomotion  Ambulation Ambulation/Gait Assistance: Not tested (comment) Stairs / Additional Locomotion Stairs: Yes Stairs Assistance: Not tested (comment) Naval architect Mobility: Yes Wheelchair Assistance: 1: +1 Total assist Distance: less than 5 feet  Trunk/Postural Assessment  Postural Control Postural Control: Deficits on evaluation  Balance Balance Balance Assessed: Yes Static Sitting Balance Static Sitting - Balance Support: Feet supported;Right upper extremity supported Static Sitting - Level of Assistance: 2: Max assist Extremity Assessment  B UEs as per OT evaluation.  RLE Assessment RLE Assessment: Exceptions to [facility] RLE AROM (degrees) Overall AROM Right Lower Extremity: Within functional limits for tasks  assessed RLE Strength RLE Overall Strength: Deficits RLE Overall Strength Comments: grossly 3-/5 throughout LLE AROM (degrees) Overall AROM Left Lower Extremity: Deficits LLE Overall AROM Comments: increased tone L LE, no votional movement noted.  LLE Strength LLE Overall Strength: Deficits LLE Overall Strength Comments: grossly 0/5  FIM:  FIM - Bed/Chair Transfer Bed/Chair Transfer Assistive Devices: HOB elevated;Arm rests;Bed rails Bed/Chair Transfer: 1: Supine > Sit: Total A (helper does all/Pt. < 25%);1: Sit > Supine: Total A (helper does all/Pt. < 25%);1: Chair or W/C > Bed: Total  A (helper does all/Pt. < 25%);1: Bed > Chair or W/C: Total A (helper does all/Pt. < 25%) FIM - Locomotion: Wheelchair Distance: less than 5 feet Locomotion: Wheelchair: 1: Total Assistance/staff pushes wheelchair (Pt<25%) FIM - Locomotion: Ambulation Ambulation/Gait Assistance: Not tested (comment) FIM - Locomotion: Stairs Locomotion: Programmer, systems Devices: Other (comment) (not tested)   Refer to Care Plan for Long Term Goals  Recommendations for other services: None  Discharge Criteria: Patient will be discharged from PT if patient refuses treatment 3 consecutive times without medical reason, if treatment goals not met, if there is a change in medical status, if patient makes no progress towards goals or if patient is discharged from hospital.  The above assessment, treatment plan, treatment alternatives and goals were discussed and mutually agreed upon: by patient  Rayford Halsted 10/03/2013, 3:46 PM

## 2013-10-03 NOTE — Evaluation (Signed)
Speech Language Pathology Assessment and Plan  Patient Details  Name: Tina Chambers MRN: [id number] Date of Birth: Apr 11, 1927  SLP Diagnosis: Dysarthria;Dysphagia;Cognitive Impairments  Rehab Potential: Good ELOS: 20-30 days   Today's Date: 10/03/2013 Time: 1420-1510 Time Calculation (min): 50 min  Problem List:  Patient Active Problem List   Diagnosis Date Noted  . Acute right MCA stroke 10/02/2013  . Atrial fibrillation 10/02/2013  . Essential hypertension, benign 10/02/2013  . Other and unspecified hyperlipidemia 10/02/2013  . Encounter for long-term (current) use of medications 10/02/2013  . Acute ischemic stroke 09/29/2013  . Acute respiratory failure 09/29/2013   Past Medical History:  Past Medical History  Diagnosis Date  . Hypertension   . Atrial fibrillation   . CHF (congestive heart failure)   . Hypothyroid   . Hyperlipidemia   . GIB (gastrointestinal bleeding)     recurrent  . Pelvic fracture    Past Surgical History: No past surgical history on file.  Assessment / Plan / Recommendation Clinical Impression  The patient is an-79 year-old female with history of HTN, recent pelvic fracture, A Fib--off blood thinners due to GIB (most recent ~ 2 weeks ago), CHF who was admitted on 09/29/13, being found with facial droop, right gaze preference and left sided weakness. CCT without evidence of bleed and patient underwent cerebral angiogram with complete revascularization of occluded R-MCA with IA tpa and thrombectomy with Solitaire device. Follow up CCT with evolving acute infarct R-MCA territory involving parietal and temporal lobe.  She was extubated on 11/05 and started on D 1, nectar thick liquids due to significant pocketing with spillage as well as decrease LOC. Patient continues with left sided weakness, left inattention with right gaze preference, poor short term memory, poor attention to tasks. Therapies evaluations done and CIR recommended by team.  The  patient was seen today for speech therapy evaluation.  Evaluation was difficult, as the patient fell asleep after oral motor examination and trials of nectar thick liquids given.  Despite maximum verbal and tactile cues, the patient would not wake up and was snoring.  Granddaughter, who is a Engineer, civil (consulting), was present and felt she was tired from earlier therapy. Oral mechanism examination revealed decreased labial and lingual sensation, strength, and range of motion.  Nectar thick liquids given which showed deficits in oropharyngeal phase of swallow.  Pureed consistencies not given, as patient fell asleep. Prior to this, however, it was noted that she has dysarthric speech, and cognition that was tested showed decrease in short term memory and inability to sustain attention to task. The patient was living in an assisted living facility prior to this hospitalization, and family would like for her to return if at all possible.  Therefore, speech therapy for communication, cognition, and dysphagia is necessary to return patient to baseline and prior degree of independence.     SLP Assessment  Patient will need skilled Speech Lanaguage Pathology Services during CIR admission    Recommendations  Diet Recommendations: Dysphagia 1 (Puree);Nectar-thick liquid Liquid Administration via: Cup;Straw Medication Administration: Crushed with puree Supervision: Full supervision/cueing for compensatory strategies;Staff to assist with self feeding Compensations: Slow rate;Small sips/bites;Check for pocketing;Multiple dry swallows after each bite/sip Postural Changes and/or Swallow Maneuvers: Seated upright 90 degrees Oral Care Recommendations: Oral care BID Patient destination: Assisted Living Follow up Recommendations: Home Health SLP Equipment Recommended: None recommended by SLP    SLP Frequency 5 out of 7 days   SLP Treatment/Interventions Cognitive remediation/compensation;Speech/Language facilitation;Therapeutic  Activities;Therapeutic Exercise;Dysphagia/aspiration precaution training;Oral motor exercises;Patient/family  education    Pain Pain Assessment Pain Assessment: No/denies pain Prior Functioning    Short Term Goals: Week 1: SLP Short Term Goal 1 (Week 1): Patient will perform oral motor exercises with min A mulitmodal cueing to increase function of oral phase of swallow as well as increase speech intelligibility, in order to return to prior level of independence.  SLP Short Term Goal 2 (Week 1): Patient will use compensatory strategies with min A multimodal cueing to decrease aspiration risk.  SLP Short Term Goal 3 (Week 1): Patient will sustain attention to task for 10 minutes with min A multimodal cueing, to increase functional independence.  SLP Short Term Goal 4 (Week 1): Patient will use environmental cues to recall new/daily information with min A multimodal cues, to increase functional independence.  SLP Short Term Goal 5 (Week 1): Patient will use compensatory strategies to increase speech intelligibility with min A multimodal cueing, to increase functional communication.   See FIM for current functional status Refer to Care Plan for Long Term Goals  Recommendations for other services: None  Discharge Criteria: Patient will be discharged from SLP if patient refuses treatment 3 consecutive times without medical reason, if treatment goals not met, if there is a change in medical status, if patient makes no progress towards goals or if patient is discharged from hospital.  The above assessment, treatment plan, treatment alternatives and goals were discussed and mutually agreed upon: by family  Lenny Pastel 10/03/2013, 6:40 PM

## 2013-10-04 ENCOUNTER — Inpatient Hospital Stay (HOSPITAL_COMMUNITY): Payer: Medicare Other | Admitting: Physical Therapy

## 2013-10-04 LAB — CBC
HCT: 31.5 % — ABNORMAL LOW (ref 36.0–46.0)
Hemoglobin: 9.6 g/dL — ABNORMAL LOW (ref 12.0–15.0)
MCH: 24.9 pg — ABNORMAL LOW (ref 26.0–34.0)
MCHC: 30.5 g/dL (ref 30.0–36.0)
MCV: 81.8 fL (ref 78.0–100.0)
WBC: 8 10*3/uL (ref 4.0–10.5)

## 2013-10-04 LAB — URINE CULTURE
Colony Count: NO GROWTH
Culture: NO GROWTH

## 2013-10-04 MED ORDER — AMLODIPINE BESYLATE 5 MG PO TABS
5.0000 mg | ORAL_TABLET | Freq: Every day | ORAL | Status: DC
  Administered 2013-10-04 – 2013-10-21 (×17): 5 mg via ORAL
  Filled 2013-10-04 (×20): qty 1

## 2013-10-04 NOTE — IPOC Note (Addendum)
Overall Plan of Care [facility]) Patient Details Name: Tina Chambers MRN: [id number] DOB: 1927-08-24  Admitting Diagnosis: rt cva  Hospital Problems: Principal Problem:   Acute right MCA stroke Active Problems:   Atrial fibrillation   Essential hypertension, benign     Functional Problem List: Nursing Bladder;Bowel;Edema;Endurance;Medication Management;Nutrition;Safety;Skin Integrity;Perception  PT Balance;Endurance;Motor;Safety;Perception;Sensory  OT Balance;Cognition;Edema;Endurance;Motor;Nutrition;Pain;Perception;Safety;Sensory;Skin Integrity;Vision  SLP Cognition;Nutrition;Linguistic;Safety  TR         Basic ADL's: OT Eating;Bathing;Grooming;Dressing;Toileting     Advanced  ADL's: OT  (n/a at this time)     Transfers: PT Bed Mobility;Bed to Chair;Car  OT Toilet;Tub/Shower     Locomotion: PT Ambulation;Wheelchair Mobility;Stairs     Additional Impairments: OT Fuctional Use of Upper Extremity  SLP Swallowing;Communication;Social Cognition expression;comprehension Social Interaction;Problem Solving;Memory;Attention;Awareness  TR      Anticipated Outcomes Item Anticipated Outcome  Self Feeding supervision/set-up assist  Swallowing  Min assist   Basic self-care  Mod I  Toileting  Mod I   Bathroom Transfers Mod I  Bowel/Bladder  Manage bowel and bladder with mod assistance  Transfers  min A for transfers  Locomotion  S for w/c mobility, mod A for ambulation  Communication  Min assist  Cognition  Min assist  Pain  less than or equal to 3/10  Safety/Judgment  Anticipate needs of patient and use of call bell for needs   Therapy Plan: PT Intensity: Minimum of 1-2 x/day ,45 to 90 minutes PT Frequency: 5 out of 7 days PT Duration Estimated Length of Stay: 21 to 28 days OT Intensity: Minimum of 1-2 x/day, 45 to 90 minutes OT Frequency: 5 out of 7 days OT Duration/Estimated Length of Stay: 21-28 days SLP Intensity: Minumum of 1-2 x/day, 30 to 90  minutes SLP Frequency: 5 out of 7 days SLP Duration/Estimated Length of Stay: 20-30 days       Team Interventions: Nursing Interventions Patient/Family Education;Bladder Management;Bowel Management;Disease Management/Prevention;Medication Management;Cognitive Remediation/Compensation;Dysphagia/Aspiration Precaution Training;Discharge Planning;Psychosocial Support  PT interventions Ambulation/gait training;Discharge planning;DME/adaptive equipment instruction;Balance/vestibular training;Functional electrical stimulation;Functional mobility training;Patient/family education;Neuromuscular re-education;Splinting/orthotics;Therapeutic Exercise;UE/LE Coordination activities;Therapeutic Activities;UE/LE Strength taining/ROM;Wheelchair propulsion/positioning;Stair training  OT Interventions Balance/vestibular training;Community reintegration;Discharge planning;DME/adaptive equipment instruction;Functional electrical stimulation;Functional mobility training;Neuromuscular re-education;Pain management;Psychosocial support;Self Care/advanced ADL retraining;Skin care/wound managment;Splinting/orthotics;Therapeutic Activities;Therapeutic Exercise;UE/LE Strength taining/ROM;Patient/family education;UE/LE Coordination activities;Wheelchair propulsion/positioning;Visual/perceptual remediation/compensation  SLP Interventions Cognitive remediation/compensation;Speech/Language facilitation;Therapeutic Activities;Therapeutic Exercise;Dysphagia/aspiration precaution training;Oral motor exercises;Patient/family education  TR Interventions    SW/CM Interventions Discharge Planning;Psychosocial Support;Patient/Family Education    Team Discharge Planning: Destination: PT-Home ,OT- Home (home vs SNF depending on family situation) , SLP-Skilled Nursing Facility (SNF) Projected Follow-up: PT-Other (comment) (to be determined based on progress with therapy ), OT-  Home health OT;Skilled nursing facility (HHOT vs SNF rehab),  SLP-Skilled Nursing facility;24 hour supervision/assistance Projected Equipment Needs: PT-Wheelchair cushion (measurements);Wheelchair (measurements);Other (comment) (other equipment to be determined as pt progresses), OT-  , SLP-None recommended by SLP Patient/family involved in discharge planning: PT- Patient,  OT-Patient unable/family or caregiver not available, SLP-Family member/caregiver  MD ELOS: 22-25 days Medical Rehab Prognosis:  Fair Assessment: 79 y.o. female with history of HTN, recent pelvic fracture, A Fib--off blood thinners due to GIB (most recent ~ 2 weeks ago), CHF who was admitted on 09/29/13 past being found with facial droop, right gaze preference and left sided weakness. CCT without evidence of bleed and patient underwent cerebral angiogram with complete revascularization of occluded R-MCA with IA tpa and thrombectomy with Solitaire device. Follow up CCT with evolving acute infarct R-MCA territory involving parietal and temporal lobe. 2D echo with EF 50-55% and no  wall abnormality, grade 1 diastolic dysfunction. Carotid dopplers with 40-59% R-ICA stenosis and 1- 39% L-ICA stenosis. Extubated on 11/05 and started on D 1, nectar thick liquids due to significant pocketing with spillage as well as decrease LOC   Now requiring 24/7 Rehab RN,MD, as well as CIR level PT, OT and SLP.  Treatment team will focus on ADLs and mobility with goals set at min A  See Team Conference Notes for weekly updates to the plan of care

## 2013-10-04 NOTE — Progress Notes (Signed)
Physical Therapy Session Note  Patient Details  Name: Tina Chambers MRN: [id number] Date of Birth: 11-Mar-1927  Today's Date: 10/04/2013 Time: 1191-4782 Time Calculation (min): 43 min  Short Term Goals: Week 1:  PT Short Term Goal 1 (Week 1): Pt will roll to R with side rail and max A.  PT Short Term Goal 2 (Week 1): Pt will roll to L with side rail and mod A.  PT Short Term Goal 3 (Week 1): Pt will transfer supine to edge of bed, edge of bed to supine with siderail and mod A.  PT Short Term Goal 4 (Week 1): Pt will transfer bed to chair, chair to bed to the R side with max A.  PT Short Term Goal 5 (Week 1): Pt will propel w/c 50 feet with R UE and LE, requiring min A.   Skilled Therapeutic Interventions/Progress Updates:  Pt was seen bedside in the am. Pt would arouse to stimuli, requiring constant stimulus to maintain arousal. Pt rolled R/L with head of bed flat, side rail and total A with verbal cues. Pt transferred supine to edge of bed, edge of bed to supine with total A. Pt able to sit on edge of bed for 5 mins x 2 with max to total A. Pt transferred bed to w/c x2 with sliding board to R side x 2 with total A. Following treatment, pt left supine in bed with call bell within reach.   Therapy Documentation Precautions:  Precautions Precautions: Fall Precaution Comments: L neglect Restrictions Weight Bearing Restrictions: No General:   Pain: No c/o pain.  See FIM for current functional status  Therapy/Group: Individual Therapy  Rayford Halsted 10/04/2013, 11:35 AM

## 2013-10-04 NOTE — Progress Notes (Signed)
Unable to void.  Scan is 417 ml.  In and out cath performed by j. Hall, NT for 500 ml.  Patient tolerated fairly well.

## 2013-10-04 NOTE — Plan of Care (Signed)
Problem: RH COGNITION-NURSING Goal: RH STG USES MEMORY AIDS/STRATEGIES W/ASSIST TO PROBLEM SOLVE STG Uses Memory Aids/Strategies With mod Assistance to Problem Solve.  Outcome: Not Progressing Pt's level of arousal fluctuates at this time

## 2013-10-04 NOTE — Plan of Care (Signed)
Problem: RH COGNITION-NURSING Goal: RH STG ANTICIPATES NEEDS/CALLS FOR ASSIST W/ASSIST/CUES STG Anticipates Needs/Calls for Assist With Mod Assistance/Cues.  Outcome: Not Progressing No initiation of use of call bell or communication of needs

## 2013-10-04 NOTE — Plan of Care (Signed)
Problem: RH BLADDER ELIMINATION Goal: RH STG MANAGE BLADDER WITH ASSISTANCE STG Manage Bladder With mod Assistance  Outcome: Not Progressing Requires I&O cath due to retention or high PVR after incontinence

## 2013-10-04 NOTE — Progress Notes (Signed)
Patient ID: Tina Chambers, female   DOB: 09/02/1927, 79 y.o.   MRN: [id number] Patient ID: AYAUNA Chambers, female   DOB: 1927-08-07, 79 y.o.   MRN: [id number]   34/75.  78 year old patient admitted with left-sided weakness secondary to an acute right MCA stroke. She has history of hypertension atrial fibrillation and recent pelvic fracture and GI bleed and anticoagulation therapy had been held. Patient has chronic hypertension and dyslipidemia. Hospital course complicated by acute respiratory failure. Status post extubation on November 5  FOB testing yesterday positive; lost some blood from d/ced IV site during the night   Review of Systems  Unable to perform ROS: mental acuity     Past Medical History   Diagnosis  Date   .  Hypertension     .  Atrial fibrillation     .  CHF (congestive heart failure)     .  Hypothyroid     .  Hyperlipidemia           Social History:  Moved to [place] recently- Was in SNF till a week PTA ( Aug-a week PTA) for pelvic fracture. Per report she has never smoked. She does not have any smokeless tobacco history on file. Per reports that she does not drink alcohol or use illicit drugs.    Allergies   Allergen  Reactions   .  Phenergan [Promethazine Hcl]         From [facility]       Medications Prior to Admission   Medication  Sig  Dispense  Refill   .  atorvastatin (LIPITOR) 10 MG tablet  Take 10 mg by mouth daily.         Marland Kitchen  diltiazem (DILACOR XR) 180 MG 24 hr capsule  Take 180 mg by mouth daily.         .  ferrous sulfate 325 (65 FE) MG tablet  Take 325 mg by mouth 2 (two) times daily with a meal.         .  FLUoxetine (PROZAC) 10 MG capsule  Take 10 mg by mouth daily.         Marland Kitchen  HYDROcodone-acetaminophen (NORCO/VICODIN) 5-325 MG per tablet  Take 1 tablet by mouth daily.         Marland Kitchen  HYDROcodone-acetaminophen (NORCO/VICODIN) 5-325 MG per tablet  Take 1 tablet by mouth every 4 (four) hours as needed for moderate pain.         Marland Kitchen   levothyroxine (SYNTHROID, LEVOTHROID) 75 MCG tablet  Take 75 mcg by mouth daily before breakfast.         .  omeprazole (PRILOSEC) 20 MG capsule  Take 20 mg by mouth daily.         Marland Kitchen  OVER THE COUNTER MEDICATION  Take 2 capsules by mouth 2 (two) times daily. "Probiotic Intestinal 50-500-100"         .  solifenacin (VESICARE) 10 MG tablet  Take 10 mg by mouth daily.         .  sotalol (BETAPACE) 80 MG tablet  Take 80 mg by mouth 2 (two) times daily.         .  temazepam (RESTORIL) 15 MG capsule  Take 15 mg by mouth at bedtime as needed for sleep.         Marland Kitchen  torsemide (DEMADEX) 10 MG tablet  Take 10 mg by mouth daily.         .  traMADol (ULTRAM) 50  MG tablet  Take by mouth 3 (three) times daily as needed for moderate pain.                 Blood pressure 100/67, pulse 84, temperature 98.3 F (36.8 C), temperature source Oral, resp. rate 27, height 5\' 4"  (1.626 m), weight 62.2 kg (137 lb 2 oz), SpO2 100.00%. Physical Exam Constitutional: She appears well-developed and well-nourished.   HENT: NGT in place N/C in place; lips slightly dry Head: Normocephalic and atraumatic.   Eyes: Conjunctivae and EOM are normal. Pupils are equal, round, and reactive to light.   Neck: Normal range of motion. Neck supple.   Cardiovascular: Normal rate, regular rhythm and normal heart sounds. No murmurs No murmur heard.   Respiratory: Effort normal and breath sounds normal. No respiratory distress. She has no wheezes. She has no rales.  Mild tachypnea GI: Soft. Bowel sounds are normal. She exhibits no distension. There is no tenderness. There is no rebound.  Neurological:  Left ptosis and L HP Extr- no edema; mitten R hand; IVF L arm d/ced   Lab Results  Component Value Date   WBC 8.0 10/04/2013   HGB 9.6* 10/04/2013   HCT 31.5* 10/04/2013   MCV 81.8 10/04/2013   PLT 135* 10/04/2013    Patient Vitals for the past 24 hrs:  BP Temp Temp src Pulse Resp SpO2  10/04/13 0434 178/84 mmHg 98.2 F (36.8 C)  Axillary 83 19 100 %  10/03/13 1528 153/71 mmHg 98.7 F (37.1 C) Oral 73 19 100 %    Intake/Output Summary (Last 24 hours) at 10/04/13 0756 Last data filed at 10/04/13 0631  Gross per 24 hour  Intake    940 ml  Output   1100 ml  Net   -160 ml   Assessment  S/p Acute R MCA stroke HTN-stable A Fib GIB- H/H stable; empiric PPI (h/o LGIB and ABLA)

## 2013-10-05 ENCOUNTER — Inpatient Hospital Stay (HOSPITAL_COMMUNITY): Payer: Medicare Other | Admitting: Occupational Therapy

## 2013-10-05 ENCOUNTER — Inpatient Hospital Stay (HOSPITAL_COMMUNITY): Payer: Medicare Other

## 2013-10-05 ENCOUNTER — Inpatient Hospital Stay (HOSPITAL_COMMUNITY): Payer: Medicare Other | Admitting: Speech Pathology

## 2013-10-05 ENCOUNTER — Inpatient Hospital Stay (HOSPITAL_COMMUNITY): Payer: Medicare Other | Admitting: Physical Therapy

## 2013-10-05 DIAGNOSIS — G8110 Spastic hemiplegia affecting unspecified side: Secondary | ICD-10-CM

## 2013-10-05 DIAGNOSIS — I6330 Cerebral infarction due to thrombosis of unspecified cerebral artery: Secondary | ICD-10-CM

## 2013-10-05 DIAGNOSIS — I69991 Dysphagia following unspecified cerebrovascular disease: Secondary | ICD-10-CM

## 2013-10-05 LAB — URINALYSIS, ROUTINE W REFLEX MICROSCOPIC
Bilirubin Urine: NEGATIVE
Glucose, UA: NEGATIVE mg/dL
pH: 6.5 (ref 5.0–8.0)

## 2013-10-05 LAB — CBC WITH DIFFERENTIAL/PLATELET
Basophils Absolute: 0 10*3/uL (ref 0.0–0.1)
Basophils Relative: 0 % (ref 0–1)
Eosinophils Absolute: 0 10*3/uL (ref 0.0–0.7)
Eosinophils Relative: 0 % (ref 0–5)
Lymphocytes Relative: 22 % (ref 12–46)
Lymphs Abs: 2 10*3/uL (ref 0.7–4.0)
MCH: 24.5 pg — ABNORMAL LOW (ref 26.0–34.0)
MCHC: 30.4 g/dL (ref 30.0–36.0)
MCV: 80.5 fL (ref 78.0–100.0)
Monocytes Absolute: 1 10*3/uL (ref 0.1–1.0)
Platelets: 151 10*3/uL (ref 150–400)
RBC: 4.16 MIL/uL (ref 3.87–5.11)
RDW: 17.6 % — ABNORMAL HIGH (ref 11.5–15.5)
WBC: 8.8 10*3/uL (ref 4.0–10.5)

## 2013-10-05 LAB — COMPREHENSIVE METABOLIC PANEL
ALT: 15 U/L (ref 0–35)
Calcium: 9.1 mg/dL (ref 8.4–10.5)
GFR calc Af Amer: >90 mL/min (ref >90)
Glucose, Bld: 110 mg/dL — ABNORMAL HIGH (ref 70–99)
Sodium: 137 mEq/L (ref 135–145)
Total Bilirubin: 0.5 mg/dL (ref 0.3–1.2)
Total Protein: 6.1 g/dL (ref 6.0–8.3)

## 2013-10-05 LAB — URINE MICROSCOPIC-ADD ON

## 2013-10-05 LAB — OCCULT BLOOD X 1 CARD TO LAB, STOOL: Fecal Occult Bld: NEGATIVE

## 2013-10-05 MED ORDER — TORSEMIDE 5 MG PO TABS
5.0000 mg | ORAL_TABLET | Freq: Every day | ORAL | Status: DC
  Administered 2013-10-06 – 2013-10-13 (×8): 5 mg via ORAL
  Administered 2013-10-14: 11:00:00 via ORAL
  Administered 2013-10-15 – 2013-10-16 (×2): 5 mg via ORAL
  Administered 2013-10-17 – 2013-10-18 (×2): via ORAL
  Administered 2013-10-19 – 2013-10-21 (×3): 5 mg via ORAL
  Filled 2013-10-05 (×17): qty 1

## 2013-10-05 MED ORDER — TORSEMIDE 10 MG PO TABS
10.0000 mg | ORAL_TABLET | Freq: Every day | ORAL | Status: DC
  Filled 2013-10-05 (×2): qty 1

## 2013-10-05 MED ORDER — POTASSIUM CHLORIDE 20 MEQ/15ML (10%) PO LIQD
20.0000 meq | Freq: Every day | ORAL | Status: DC
  Administered 2013-10-05 – 2013-10-08 (×4): 20 meq via ORAL
  Filled 2013-10-05 (×5): qty 15

## 2013-10-05 MED ORDER — TIZANIDINE HCL 2 MG PO TABS
2.0000 mg | ORAL_TABLET | Freq: Every day | ORAL | Status: DC
  Filled 2013-10-05: qty 1

## 2013-10-05 MED ORDER — JEVITY 1.2 CAL PO LIQD
1000.0000 mL | ORAL | Status: DC
  Filled 2013-10-05: qty 1000

## 2013-10-05 MED ORDER — JEVITY 1.2 CAL PO LIQD
1000.0000 mL | ORAL | Status: DC
  Administered 2013-10-06 – 2013-10-15 (×7): 1000 mL
  Administered 2013-10-18: 17:00:00
  Administered 2013-10-19: 1000 mL
  Filled 2013-10-05 (×26): qty 1000

## 2013-10-05 MED ORDER — FREE WATER
200.0000 mL | Freq: Three times a day (TID) | Status: DC
  Administered 2013-10-05 – 2013-10-21 (×55): 200 mL

## 2013-10-05 MED ORDER — FUROSEMIDE 10 MG/ML IJ SOLN
20.0000 mg | Freq: Once | INTRAMUSCULAR | Status: AC
  Administered 2013-10-05: 20 mg via INTRAVENOUS
  Filled 2013-10-05: qty 2

## 2013-10-05 MED ORDER — TORSEMIDE 5 MG PO TABS: 5.0000 mg | ORAL_TABLET | Freq: Every day | ORAL | Status: DC

## 2013-10-05 MED ORDER — JEVITY 1.2 CAL PO LIQD
1000.0000 mL | ORAL | Status: DC
  Filled 2013-10-05 (×3): qty 1000

## 2013-10-05 MED ORDER — FREE WATER: 100.0000 mL | Freq: Three times a day (TID) | Status: DC

## 2013-10-05 NOTE — Progress Notes (Signed)
The skilled treatment note has been reviewed and SLP is in agreement. Fiora Weill, M.A., CCC-SLP [phone]  

## 2013-10-05 NOTE — Progress Notes (Addendum)
Family expressing concerns about patient's perceived decline. They report that she was talking, feeding self, asking for assistance and very alert. They were told that she would be on a regular floor for a week prior to going to rehab and are disturbed that this was not done. Therapy notes reviewed from acute and lethargy reported in multiple notes. Patient has had poor po intake with increase in activity level that likely contributing to her fatigue. Medical issues discussed. . Will check CT head to rule out new stroke, extension or bleed. She looks better this afternoon past rest.   Will make patient NPO due to fluctuating MS and concerns expressed by ST. MBS to be done tomorrow---will start TF for nutritional support.   CXR with low grade CHF--discontinue IVF. Will give a dose of IV lasix today and resume demadex in am.

## 2013-10-05 NOTE — Progress Notes (Signed)
Social Work Assessment and Plan Social Work Assessment and Plan  Patient Details  Name: Tina Chambers MRN: [id number] Date of Birth: Aug 08, 1927  Today's Date: 10/05/2013  Problem List:  Patient Active Problem List   Diagnosis Date Noted  . Acute right MCA stroke 10/02/2013  . Atrial fibrillation 10/02/2013  . Essential hypertension, benign 10/02/2013  . Other and unspecified hyperlipidemia 10/02/2013  . Encounter for long-term (current) use of medications 10/02/2013  . Acute ischemic stroke 09/29/2013  . Acute respiratory failure 09/29/2013   Past Medical History:  Past Medical History  Diagnosis Date  . Hypertension   . Atrial fibrillation   . CHF (congestive heart failure)   . Hypothyroid   . Hyperlipidemia   . GIB (gastrointestinal bleeding)     recurrent  . Pelvic fracture    Past Surgical History: No past surgical history on file. Social History:  reports that she has never smoked. She does not have any smokeless tobacco history on file. She reports that she does not drink alcohol or use illicit drugs.  Family / Support Systems Marital Status: Widow/Widower Patient Roles: Parent Children: Tempie Donning  [phone]  Hilbert Corrigan  [phone] Other Supports: Two other sons Anticipated Caregiver: Posey Rea if can return back to ALF so may need to go to SNF from here Ability/Limitations of Caregiver: Children are not planning on providing care to Mom Caregiver Availability: Other (Comment) (May need to return to SNF) Family Dynamics: Close knit family who are all very close.  They are involved in their Mother's life and wil do what is needed.  All live 30-45 min away but come often to visit and participate in pt's care  Social History Preferred language: English Religion:  Cultural Background: No issues Education: High School Read: Yes Write: Yes Employment Status: Retired Fish farm manager Issues: No issues Guardian/Conservator: Deniece Portela is  one of the POA-all of pt's children are involved in her decisions.  MD does not felt pt is not capable of making her own decisions at this time, due to deficits from her stroke   Abuse/Neglect Physical Abuse: Denies Verbal Abuse: Denies Sexual Abuse: Denies Exploitation of patient/patient's resources: Denies Self-Neglect: Denies  Emotional Status Pt's affect, behavior adn adjustment status: Pt is not able to participate in interview due to deficits from her stroke and her exhaustion form therapies.  Obtained information from her daughter who was present.  She reports up until three months ago pt lived independently and was doing everything for herself.  They are just trying to adjust to all of this. Recent Psychosocial Issues: Other health issues-recent pelvic fracture and GI bleeds and now this Pyschiatric History: No history-deferred depression screen due to not able to participate at this time.  Will monitor her coping and provide support to her children Substance Abuse History: No issues  Patient / Family Perceptions, Expectations & Goals Pt/Family understanding of illness & functional limitations: Daughter can explain her mother's stroke and deficits.  She is still trying to understand all of the medical issues and asks questions.  She ahd her brother's are here often and are involved with Mom.  Has had several issues happen at once and still trying to process it all. Premorbid pt/family roles/activities: Mom, Grandmother, Home ownet, Chruch member, etc Anticipated changes in roles/activities/participation: Unsure at this time Pt/family expectations/goals: Daughter states: " We are questioning if she can return to Assisted Living or we need to give up her room."  " It is very overwhelming all of this."  Community Resources [employer]: Other (Comment) [facility] for 5 days) Premorbid Home Care/DME Agencies: None Transportation available at discharge: Family Resource  referrals recommended: Support group (specify) ([facility] Support group)  Discharge Planning Living Arrangements: Other (Comment) ([facility]) Support Systems: Children;Church/faith community Type of Residence: Assisted living Insurance Resources: Administrator (specify) Building services engineer) Financial Resources: Restaurant manager, fast food Screen Referred: No Living Expenses: Rent Money Management: Family Does the patient have any problems obtaining your medications?: No Home Management: Facility does Patient/Family Preliminary Plans: Unsure if pt can return to [facility] versus going back to a SNF from here.  Daughter aware pt will require 24 hour phsycial care upon discharge form here.  Appears pt will need SNF from here.   Social Work Anticipated Follow Up Needs: SNF;ALF/IL  Clinical Impression Very supportive daughter who is distressed with all that has happened to her Mom in such a short period of time.  She had brother's are involved and want the best for Mom. Discussed pt would probably need SNF after leaving here and not be able to go back to Assisting Living from here.  She has used 32 Medicare days and has 68 co-pay days left in a SNF. Will work with family on the best discharge option for Mom and also provide support.  Lucy Chris 10/05/2013, 12:02 PM

## 2013-10-05 NOTE — Progress Notes (Signed)
Speech Language Pathology Daily Session Note  Patient Details  Name: Tina Chambers MRN: [id number] Date of Birth: 08/23/1927  Today's Date: 10/05/2013 Time: 4098-1191 Time Calculation (min): 60 min  Short Term Goals: Week 1: SLP Short Term Goal 1 (Week 1): Patient will perform oral motor exercises with min A mulitmodal cueing to increase function of oral phase of swallow as well as increase speech intelligibility, in order to return to prior level of independence.  SLP Short Term Goal 2 (Week 1): Patient will use compensatory strategies with min A multimodal cueing to decrease aspiration risk.  SLP Short Term Goal 3 (Week 1): Patient will sustain attention to task for 10 minutes with min A multimodal cueing, to increase functional independence.  SLP Short Term Goal 4 (Week 1): Patient will use environmental cues to recall new/daily information with min A multimodal cues, to increase functional independence.  SLP Short Term Goal 5 (Week 1): Patient will use compensatory strategies to increase speech intelligibility with min A multimodal cueing, to increase functional communication.   Skilled Therapeutic Interventions: Skilled treatment session focused on cognition, speech, and dysphagia goals.  During diagnostic treatment session session, patient required Max multi-modal cueing to sustain attention for ~5 minute intervals.  Although the SLP does not believe that the patient demonstrates true language impairments, her cognition significantly impacts her overall ability to utilize it in functional manner.  The patient's intelligibility is impacted by her vocal quality in addition to her dysarthria; patient required Mod verbal and visual cues to utilize intelligibility strategies.  SLP also facilitated session with having patient consume trials of Dys.1 and nectar-thick liquids via teaspoon; patient required Mod verbal cues to follow SLP's directions during trials.  Patient demonstrated a wet  vocal quality and a reflexive throat clear x2 during trials; SLP directed patient with Max cueing to clear throat and cough which SLP suspects reduced penetrants/aspirates.  It is recommended that patient participate in an objective swallow study to determine least restrictive p.o. intake, assess safety during swallowing, and to determine safe swallow strategies.   FIM:  Comprehension Comprehension Mode: Auditory Comprehension: 3-Understands basic 50 - 74% of the time/requires cueing 25 - 50%  of the time Expression Expression Mode: Verbal Expression: 3-Expresses basic 50 - 74% of the time/requires cueing 25 - 50% of the time. Needs to repeat parts of sentences. Social Interaction Social Interaction: 2-Interacts appropriately 25 - 49% of time - Needs frequent redirection. Problem Solving Problem Solving: 2-Solves basic 25 - 49% of the time - needs direction more than half the time to initiate, plan or complete simple activities Memory Memory: 2-Recognizes or recalls 25 - 49% of the time/requires cueing 51 - 75% of the time FIM - Eating Eating Activity: 3: Helper brings food to mouth every scoop;5: Needs verbal cues/supervision  Pain Pain Assessment Pain Assessment: No/denies pain Pain Score: 0-No pain  Therapy/Group: Individual Therapy  Levora Angel 10/05/2013, 4:26 PM

## 2013-10-05 NOTE — Care Management Note (Signed)
[facility] Individual Statement of Services  Patient Name:  Tina Chambers  Date:  10/05/2013  Welcome to the [facility].  Our goal is to provide you with an individualized program based on your diagnosis and situation, designed to meet your specific needs.  With this comprehensive rehabilitation program, you will be expected to participate in at least 3 hours of rehabilitation therapies Monday-Friday, with modified therapy programming on the weekends.  Your rehabilitation program will include the following services:  Physical Therapy (PT), Occupational Therapy (OT), Speech Therapy (ST), 24 hour per day rehabilitation nursing, Case Management (Social Worker), Rehabilitation Medicine, Nutrition Services and Pharmacy Services  Weekly team conferences will be held on Wednesday to discuss your progress.  Your Social Worker will talk with you frequently to get your input and to update you on team discussions.  Team conferences with you and your family in attendance may also be held.  Expected length of stay: 21-28 days  Overall anticipated outcome: min level  Depending on your progress and recovery, your program may change. Your Social Worker will coordinate services and will keep you informed of any changes. Your Social Worker's name and contact numbers are listed  below.  The following services may also be recommended but are not provided by the [facility]:   Home Health Rehabiltiation Services  Outpatient Rehabilitation Services    Arrangements will be made to provide these services after discharge if needed.  Arrangements include referral to agencies that provide these services.  Your insurance has been verified to be:  Medicare & AARP Your primary doctor is:    Pertinent information will be shared with your doctor and your insurance company.  Social Worker:  Dossie Der, [phone] or [phone]  Information  discussed with and copy given to patient by: Lucy Chris, 10/05/2013, 10:30 AM

## 2013-10-05 NOTE — Progress Notes (Signed)
In and out cath performed for 600 ml.  Urine has foul odor and hazy amber in color.  Patient tolerated well.

## 2013-10-05 NOTE — Progress Notes (Signed)
Pt lethargic this AM, arouses with some difficulty but drifts back to sleep; speech garbled; no PO intake due to stated. Dtr. expresses concern pts speech more garbled; difficulty following commands.  Tina Chambers, PAC aware; monitor. HCT done; tube feeds initiated, tolerating well.  Pt with rest breaks, napping between therapies; more alert this eve, awake, visiting with son, initiating conversation, remains confused and disoriented. Large incontinence urine after IV lasix, scanned for additional 380cc, I/O cathed for 350cc. Urine cloudy with foul odor, U/A sent, pending.  Incontinent stool, guiac sent ( negative.) Bruising at groin, labia, rt hip.  Dry dressing to rt groin 2 small open areas, yellow tissue, no drainage; see FS for full assessment.

## 2013-10-05 NOTE — Progress Notes (Signed)
Occupational Therapy Session Note  Patient Details  Name: Tina Chambers MRN: [id number] Date of Birth: 13-Aug-1927  Today's Date: 10/05/2013 Time: 4098-1191 Time Calculation (min): 55 min  Short Term Goals: Week 1:  OT Short Term Goal 1 (Week 1): Patient will perform bed mobility of roling left <>right with maximal assistance in order to assist caregiver in BADL OT Short Term Goal 2 (Week 1): Patient will maintain static sitting balance (edge of bed) for at least 5 minutes with maximal assistance in order to work towards edge of bed level BADLs OT Short Term Goal 3 (Week 1): Patient will complete grooming tasks at sink with moderate assistance OT Short Term Goal 4 (Week 1): Patient will complete self-feeding task with minimal assistance and moderate verbal cues to stay on task  Skilled Therapeutic Interventions/Progress Updates:    Pt seen for pre-functional skill training of bed mobility, visual tracking, attention to task, sitting balance/trunk control while patient was being bathed and dressed by this clinician with assistance from her daughter.  Pt needed total assist with all movements, including turning her head to the left. If her daughter was standing on her right side, pt would not gaze to left unless daughter moved to that side.  Pt was sat up to EOB and had a very strong push to the left and needed total assist to maintain static sitting. To transfer to w/c, the nurse tech assisted this clinician with SB transfer while daughter stabilized the wheelchair.  Pt did demonstrate some initiation with partially bathing her chest and her arm, was able to state her needs with 50% clarity, and did stay alert the entire session. Pt was provided with a half lap tray and the QRB was applied.  Educated daughter on how to facilitate visual and L side attention with simple activities in the room.  Therapy Documentation Precautions:  Precautions Precautions: Fall Precaution Comments: L  neglect Restrictions Weight Bearing Restrictions: No    Vital Signs: Therapy Vitals Pulse Rate: 73 BP: 128/68 mmHg Patient Position, if appropriate: Sitting Oxygen Therapy SpO2: 100 % O2 Device: None (Room air) Pain: Pain Assessment Pain Assessment: No/denies pain, except when rolling to right in bed. C/o pain in back. ADL:   See FIM for current functional status  Therapy/Group: Individual Therapy  SAGUIER,JULIA 10/05/2013, 12:02 PM

## 2013-10-05 NOTE — Progress Notes (Signed)
Physical Therapy Session Note  Patient Details  Name: Tina Chambers MRN: [id number] Date of Birth: 1927/03/26  Today's Date: 10/05/2013 Time: 1001-1115 and 1315-1411 Time Calculation (min): 74 min and 56 min  Short Term Goals: Week 1:  PT Short Term Goal 1 (Week 1): Pt will roll to R with side rail and max A.  PT Short Term Goal 2 (Week 1): Pt will roll to L with side rail and mod A.  PT Short Term Goal 3 (Week 1): Pt will transfer supine to edge of bed, edge of bed to supine with siderail and mod A.  PT Short Term Goal 4 (Week 1): Pt will transfer bed to chair, chair to bed to the R side with max A.  PT Short Term Goal 5 (Week 1): Pt will propel w/c 50 feet with R UE and LE, requiring min A.   Skilled Therapeutic Interventions/Progress Updates:   Pt just finished with OT B&D; Pt daughter, Eunice Blase, present.  Pt asleep in w/c and in flexed and L lateral lean posture.  Pt very lethargic and very difficult to arouse sitting in w/c even to sternal rub or daughter's voice.  Discussed with pt daughter pt PLOF, history of pelvic fx, move to SNF >> ALF, and D/C plan.  Daughter reports that prior to pelvic fx pt was living alone in a house and was independent with money management, light cooking, cleaning and laundry and was not experiencing memory impairments.  Reports that pt does not recall falling to cause pelvic fx.  Following fx pt D/C to SNF for rehabilitation, then readmitted to hospital for GI bleed, back to SNF for more rehabilitation and then finally to ALF.  Plan was to eventually move pt back to home from ALF.  Discussed with daughter goals for PT, LOS, and expected functional outcome at D/C.  Educated daughter that because of severity of impairments, physical and cognitive, pt will likely require 24/7 supervision and physical assistance for mobility and may be at w/c level for mobility.  Also educated daughter about team conference and ability to upgrade or downgrade goals based on pt  response and progress.  Daughter verbalized understanding and reports that she will be here Wednesday after team conference.  Pt transported to gym in w/c total A secondary to pt still asleep.  Performed transfers w/c <> mat and w/c > bed at end of session with squat pivot total A with extra time and total verbal and tactile cues for RUE placement around therapist to minimize gripping/pushing through RUE, counting for initiation of transfer and total cues to attend to mat or bed on L; for all transfers pt required increased time to process and initiate movement.  Once pt transferred pt was able to open her eyes and demonstrated focused attention to daughter and therapists intermittently.  Performed sit <> stand from mat with +2 A (3 Musketeers) with counting for initiation; pt able to maintain standing ~30 seconds at a time with focus on maintaining upright posture, head control and gaze in midline with focused attention on daughter.  Attempted to weight shift to LLE to allow pt to initiate step with RLE but pt too anxious and unable to lift RLE from floor.  Pt noted to have some flexor tone/withdrawal on LLE when repositioned but able to maintain contact with floor in standing.  Seated on mat with max-total A for sitting balance continued to work in attention to L and L body.  Returned to room and bed with  total A +2 for sit > supine to rest and await transport to CXR.  PM session: pt just returned from CXR; pt asleep in bed and continues to be very lethargic and fatigued.  Pt's family still concerned about pt decline since arriving on rehab.  Family reports that in ICU pt was speaking more clearly/intelligibly, able to report when she had incontinent urine episode and was more alert and able to feed herself and write with her RUE.  Family would like to discuss functional decline with PA.  Discussed family's concerns with PA and reviewed acute care therapy notes which state that pt required +2 A and multiple  verbal and tactile cues for arousal, attention and initiation.  PA feels that pt is fatigued from increase in number and amount of daily therapies from ICU >> CIR.  PA agreeable to come speak with family about their concerns.    Pt aroused from sleep with lights on, touch and name calling.  Performed supine > sit with HOB elevated to L side with +2 A secondary to impaired head turn, visual scanning and attention to L.  Required assistance to advance LLE to EOB but pt able to initiate RLE advancement to EOB.  Required total A to bring trunk upright EOB and to scoot to EOB secondary to L neglect and pushing to L.  Pt transferred bed > tilt in space w/c to R with multiple scoots and one squat pivot with +2 A over the back technique to facilitate full anterior and lateral lean for weight shift.  Once in w/c leg rests, half lap tray, head rest and lateral support adjusted for optimal positioning in w/c to minimize L lateral lean and to promote improved head control.  Attempted to place pt head in midline on head rest but pt resumed R rotation when not supported. Pt placed in tilted position to rest before SLP session.    Therapy Documentation Precautions:  Precautions Precautions: Fall Precaution Comments: L neglect Restrictions Weight Bearing Restrictions: No Vital Signs: Therapy Vitals Pulse Rate: 73 BP: 128/68 mmHg Patient Position, if appropriate: Sitting Oxygen Therapy SpO2: 100 % O2 Device: None (Room air) Pain: Pain Assessment Pain Assessment: No/denies pain   See FIM for current functional status  Therapy/Group: Individual Therapy  Edman Circle Faucette 10/05/2013, 12:12 PM

## 2013-10-05 NOTE — Progress Notes (Signed)
Patient information reviewed and entered into eRehab system by Loray Akard, RN, CRRN, PPS Coordinator.  Information including medical coding and functional independence measure will be reviewed and updated through discharge.     Per nursing patient was given "Data Collection Information Summary for Patients in Inpatient Rehabilitation Facilities with attached "Privacy Act Statement-Health Care Records" upon admission.  

## 2013-10-05 NOTE — Progress Notes (Signed)
NUTRITION FOLLOW-UP  DOCUMENTATION CODES Per approved criteria  -Not Applicable   INTERVENTION: Discontinue calorie count. Initiate Jevity 1.2 @ 30 ml/hr and increase by 10 ml every 4 hours to goal rate of 70 ml/hr x 20 hours. 200 ml H2O flush every 6 hours. TF regimen will provide: 1680 kcal, 77 grams protein, 1930 ml H2O Diet texture and liquid consistency per SLP. RD to continue to follow nutrition care plan.  NUTRITION DIAGNOSIS: Inadequate oral intake related to dysphagia as evidenced by meal completion <25%. Ongoing.  Goal: Pt to meet >/= 90% of their estimated nutrition needs. Unmet   Monitor:  Diet advancement, PO intake, supplement acceptance, weight trend, labs  ASSESSMENT: Pt admitted to [facility] 11/4 with R MCA, IV tPA given and thrombectomy. Pt extubated 11/5. Pt started on Dysphagia and has significant pocketing and spillage. Pt transferred to CIR on 11/7.   Calorie count over the weekend revealed pt is eating at most 10% of all meals. RD consulted for nocturnal tube feeding orders. Discussed with PA, Delle Reining. Pt is currently NPO, will provide orders for continuous feedings, per PA.  Pt is currently ordered for Jevity 1.2 at 40 ml/hr 7p-6a, if pt takes less than 50% meals during the day. This will provide: 576 kcal, 27 grams protein, 388 ml free water. Free water flushes of 200 ml QID ordered, this provides 800 ml additional free water daily.   Height: Ht Readings from Last 1 Encounters:  10/02/13 5\' 5"  (1.651 m)    Weight: Wt Readings from Last 1 Encounters:  10/05/13 141 lb 14.4 oz (64.365 kg)  Admit wt 144 lb - stable  BMI:  Body mass index is 23.61 kg/(m^2). Weight is WNL.  Estimated Nutritional Needs: Kcal: 1500-1700 Protein: 75-85 grams Fluid: > 1.5 L/day  Skin: no issues noted  Diet Order: NPO   EDUCATION NEEDS: -No education needs identified at this time   Intake/Output Summary (Last 24 hours) at 10/05/13 1121 Last data filed at 10/05/13  0900  Gross per 24 hour  Intake    295 ml  Output   1650 ml  Net  -1355 ml    Last BM: 11/8   Labs:   Recent Labs Lab 09/29/13 0849  09/29/13 0853 09/29/13 0902 09/30/13 0545 10/05/13 0630  NA  --   < > 138 139 142 137  K  --   < > 3.4* 3.1* 3.3* 3.8  CL  --   < > 96 95* 105 101  CO2  --   --  28  --  26 30  BUN  --   < > 22 26* 14 9  CREATININE  --   < > 0.85 1.10 0.68 0.47*  CALCIUM  --   --  9.8  --  9.1 9.1  MG 1.6  --   --   --   --   --   GLUCOSE  --   < > 158* 157* 108* 110*  < > = values in this interval not displayed.  CBG (last 3)   Recent Labs  10/02/13 1210 10/02/13 1555  GLUCAP 127* 116*    Scheduled Meds: . amLODipine  5 mg Oral Daily  . antiseptic oral rinse  15 mL Mouth Rinse BID  . aspirin  325 mg Oral Daily  . atorvastatin  10 mg Oral q1800  . darifenacin  7.5 mg Oral Daily  . FLUoxetine  10 mg Oral Daily  . free water  200 mL Per Tube  TID WC & HS  . levothyroxine  75 mcg Oral QAC breakfast  . pantoprazole sodium  40 mg Per Tube Daily  . sotalol  80 mg Oral Q12H  . tiZANidine  2 mg Oral QHS    Continuous Infusions: . feeding supplement (JEVITY 1.2 CAL)      Jarold Motto MS, RD, LDN Pager: [phone] After-hours pager: [phone]

## 2013-10-05 NOTE — Progress Notes (Deleted)
NUTRITION FOLLOW-UP  DOCUMENTATION CODES Per approved criteria  -Not Applicable   INTERVENTION: Discontinue calorie count. Initiate Jevity 1.2 @ 30 ml/hr and increase by 10 ml every 4 hours to goal rate of 70 ml/hr x 20 hours. 200 ml H2O flush every 6 hours. TF regimen will provide: 1680 kcal, 77 grams protein, 1930 ml H2O Diet texture and liquid consistency per SLP. RD to continue to follow nutrition care plan.  NUTRITION DIAGNOSIS: Inadequate oral intake related to dysphagia as evidenced by meal completion <25%. Ongoing.  Goal: Pt to meet >/= 90% of their estimated nutrition needs. Unmet   Monitor:  Diet advancement, TF tolerance, PO intake, supplement acceptance, weight trend, labs  ASSESSMENT: Pt admitted to [facility] 11/4 with R MCA, IV tPA given and thrombectomy. Pt extubated 11/5. Pt started on Dysphagia and has significant pocketing and spillage. Pt transferred to CIR on 11/7.   Calorie count over the weekend revealed pt is eating at most 10% of all meals. RD consulted for nocturnal tube feeding orders. Discussed with PA, Delle Reining. Pt is currently NPO, will provide orders for continuous feedings, per PA.  Pt is currently ordered for Jevity 1.2 at 40 ml/hr 7p-6a, if pt takes less than 50% meals during the day. This will provide: 576 kcal, 27 grams protein, 388 ml free water. Free water flushes of 200 ml QID ordered, this provides 800 ml additional free water daily.   Height: Ht Readings from Last 1 Encounters:  10/02/13 5\' 5"  (1.651 m)    Weight: Wt Readings from Last 1 Encounters:  10/05/13 141 lb 14.4 oz (64.365 kg)  Admit wt 144 lb - stable  BMI:  Body mass index is 23.61 kg/(m^2). Weight is WNL.  Estimated Nutritional Needs: Kcal: 1500-1700 Protein: 75-85 grams Fluid: > 1.5 L/day  Skin: no issues noted  Diet Order: NPO   EDUCATION NEEDS: -No education needs identified at this time   Intake/Output Summary (Last 24 hours) at 10/05/13 1140 Last data filed at  10/05/13 0900  Gross per 24 hour  Intake    295 ml  Output   1650 ml  Net  -1355 ml    Last BM: 11/8   Labs:   Recent Labs Lab 09/29/13 0849  09/29/13 0853 09/29/13 0902 09/30/13 0545 10/05/13 0630  NA  --   < > 138 139 142 137  K  --   < > 3.4* 3.1* 3.3* 3.8  CL  --   < > 96 95* 105 101  CO2  --   --  28  --  26 30  BUN  --   < > 22 26* 14 9  CREATININE  --   < > 0.85 1.10 0.68 0.47*  CALCIUM  --   --  9.8  --  9.1 9.1  MG 1.6  --   --   --   --   --   GLUCOSE  --   < > 158* 157* 108* 110*  < > = values in this interval not displayed.  CBG (last 3)   Recent Labs  10/02/13 1210 10/02/13 1555  GLUCAP 127* 116*    Scheduled Meds: . amLODipine  5 mg Oral Daily  . antiseptic oral rinse  15 mL Mouth Rinse BID  . aspirin  325 mg Oral Daily  . atorvastatin  10 mg Oral q1800  . darifenacin  7.5 mg Oral Daily  . FLUoxetine  10 mg Oral Daily  . free water  200 mL  Per Tube TID WC & HS  . levothyroxine  75 mcg Oral QAC breakfast  . pantoprazole sodium  40 mg Per Tube Daily  . sotalol  80 mg Oral Q12H  . tiZANidine  2 mg Oral QHS    Continuous Infusions: . feeding supplement (JEVITY 1.2 CAL)      Jarold Motto MS, RD, LDN Pager: [phone] After-hours pager: [phone]

## 2013-10-05 NOTE — Progress Notes (Addendum)
Subjective/Complaints: 79 y.o. female with history of HTN, recent pelvic fracture, A Fib--off blood thinners due to GIB (most recent ~ 2 weeks ago), CHF who was admitted on 09/29/13 past being found with facial droop, right gaze preference and left sided weakness. CCT without evidence of bleed and patient underwent cerebral angiogram with complete revascularization of occluded R-MCA with IA tpa and thrombectomy with Solitaire device. Follow up CCT with evolving acute infarct R-MCA territory involving parietal and temporal lobe. 2D echo with EF 50-55% and no wall abnormality, grade 1 diastolic dysfunction. Carotid dopplers with 40-59% R-ICA stenosis and 1- 39% L-ICA stenosis. Extubated on 11/05 and started on D 1, nectar thick liquids due to significant pocketing with spillage as well as decrease LOC. Patient continues with left sided weakness, left inattention with right gaze preference, poor short term memory  Takes 10-40% meals RN notes heavy breathing thru mouth Review of Systems - unable to do ROS secondary to mental status  Objective: Vital Signs: Blood pressure 159/67, pulse 78, temperature 98 F (36.7 C), temperature source Axillary, resp. rate 18, height 5\' 5"  (1.651 m), weight 64.365 kg (141 lb 14.4 oz), SpO2 100.00%. No results found. Results for orders placed during the hospital encounter of 10/02/13 (from the past 72 hour(s))  OCCULT BLOOD X 1 CARD TO LAB, STOOL     Status: Abnormal   Collection Time    10/03/13  3:46 AM      Result Value Range   Fecal Occult Bld POSITIVE (*) NEGATIVE  URINALYSIS, ROUTINE W REFLEX MICROSCOPIC     Status: Abnormal   Collection Time    10/03/13 10:52 AM      Result Value Range   Color, Urine YELLOW  YELLOW   APPearance CLEAR  CLEAR   Specific Gravity, Urine 1.017  1.005 - 1.030   pH 6.0  5.0 - 8.0   Glucose, UA NEGATIVE  NEGATIVE mg/dL   Hgb urine dipstick NEGATIVE  NEGATIVE   Bilirubin Urine NEGATIVE  NEGATIVE   Ketones, ur NEGATIVE  NEGATIVE  mg/dL   Protein, ur 30 (*) NEGATIVE mg/dL   Urobilinogen, UA 1.0  0.0 - 1.0 mg/dL   Nitrite NEGATIVE  NEGATIVE   Leukocytes, UA NEGATIVE  NEGATIVE  URINE CULTURE     Status: None   Collection Time    10/03/13 10:52 AM      Result Value Range   Specimen Description URINE, CATHETERIZED     Special Requests NONE     Culture  Setup Time       Value: 10/03/2013 21:39     Performed at [facility] Count       Value: NO GROWTH     Performed at [facility]   Culture       Value: NO GROWTH     Performed at [facility]   Report Status 10/04/2013 FINAL    URINE MICROSCOPIC-ADD ON     Status: None   Collection Time    10/03/13 10:52 AM      Result Value Range   Squamous Epithelial / LPF RARE  RARE   WBC, UA 0-2  <3 WBC/hpf  CBC     Status: Abnormal   Collection Time    10/04/13  5:25 AM      Result Value Range   WBC 8.0  4.0 - 10.5 K/uL   RBC 3.85 (*) 3.87 - 5.11 MIL/uL   Hemoglobin 9.6 (*) 12.0 - 15.0 g/dL  HCT 31.5 (*) 36.0 - 46.0 %   MCV 81.8  78.0 - 100.0 fL   MCH 24.9 (*) 26.0 - 34.0 pg   MCHC 30.5  30.0 - 36.0 g/dL   RDW 69.6 (*) 29.5 - 28.4 %   Platelets 135 (*) 150 - 400 K/uL  OCCULT BLOOD X 1 CARD TO LAB, STOOL     Status: None   Collection Time    10/05/13  4:50 AM      Result Value Range   Fecal Occult Bld NEGATIVE  NEGATIVE  CBC WITH DIFFERENTIAL     Status: Abnormal   Collection Time    10/05/13  6:30 AM      Result Value Range   WBC 8.8  4.0 - 10.5 K/uL   RBC 4.16  3.87 - 5.11 MIL/uL   Hemoglobin 10.2 (*) 12.0 - 15.0 g/dL   HCT 13.2 (*) 44.0 - 10.2 %   MCV 80.5  78.0 - 100.0 fL   MCH 24.5 (*) 26.0 - 34.0 pg   MCHC 30.4  30.0 - 36.0 g/dL   RDW 72.5 (*) 36.6 - 44.0 %   Platelets 151  150 - 400 K/uL   Neutrophils Relative % 65  43 - 77 %   Neutro Abs 5.7  1.7 - 7.7 K/uL   Lymphocytes Relative 22  12 - 46 %   Lymphs Abs 2.0  0.7 - 4.0 K/uL   Monocytes Relative 12  3 - 12 %   Monocytes Absolute 1.0  0.1 - 1.0 K/uL    Eosinophils Relative 0  0 - 5 %   Eosinophils Absolute 0.0  0.0 - 0.7 K/uL   Basophils Relative 0  0 - 1 %   Basophils Absolute 0.0  0.0 - 0.1 K/uL  COMPREHENSIVE METABOLIC PANEL     Status: Abnormal   Collection Time    10/05/13  6:30 AM      Result Value Range   Sodium 137  135 - 145 mEq/L   Potassium 3.8  3.5 - 5.1 mEq/L   Chloride 101  96 - 112 mEq/L   CO2 30  19 - 32 mEq/L   Glucose, Bld 110 (*) 70 - 99 mg/dL   BUN 9  6 - 23 mg/dL   Creatinine, Ser 3.47 (*) 0.50 - 1.10 mg/dL   Calcium 9.1  8.4 - 42.5 mg/dL   Total Protein 6.1  6.0 - 8.3 g/dL   Albumin 2.7 (*) 3.5 - 5.2 g/dL   AST 24  0 - 37 U/L   ALT 15  0 - 35 U/L   Alkaline Phosphatase 81  39 - 117 U/L   Total Bilirubin 0.5  0.3 - 1.2 mg/dL   GFR calc non Af Amer 88 (*) >90 mL/min   GFR calc Af Amer >90  >90 mL/min   Comment: (NOTE)     The eGFR has been calculated using the CKD EPI equation.     This calculation has not been validated in all clinical situations.     eGFR's persistently <90 mL/min signify possible Chronic Kidney     Disease.     HEENT: normal Cardio: RRR and no murmur Resp: decreased BS R base and mildly labored mouth breathing,  GI: BS positive and non distended Extremity:  Pulses positive and No Edema Skin:   Intact Neuro: Lethargic, Anxious and Dysarthric Musc/Skel:  Other hamstring spasm Gen NAD Flexor withdrawal spasticity left lower extremity, sustained clonus left ankle Motor strength is  0/5 left upper extremity trace hip extension left lower extremity Right upper extremity 4+/5 in the deltoid, bicep, tricep, grip right lower extremity 4/5 in the hip flexor knee extensor ankle dorsiflexor plantar flexor Sensation is absent to light touch in the left upper extremity but withdraws to pain Left lower extremity withdrawal to pain, absent light touch Right upper and right lower light touch is intact Patient appears to have a field cut on the left side but difficult to test. She does turn her  head to look at objects on the left side when cued    Assessment/Plan: 1. Functional deficits secondary to R MCA infarct with L HP which require 3+ hours per day of interdisciplinary therapy in a comprehensive inpatient rehab setting. Physiatrist is providing close team supervision and 24 hour management of active medical problems listed below. Physiatrist and rehab team continue to assess barriers to discharge/monitor patient progress toward functional and medical goals. FIM: FIM - Bathing Bathing: 1: Two helpers  FIM - Upper Body Dressing/Undressing Upper body dressing/undressing: 1: Two helpers FIM - Lower Body Dressing/Undressing Lower body dressing/undressing: 1: Two helpers  FIM - Toileting Toileting: 0: Activity did not occur  FIM - Archivist Transfers: 0-Activity did not occur  FIM - Banker Devices: Bed rails;Arm rests;Sliding board Bed/Chair Transfer: 1: Supine > Sit: Total A (helper does all/Pt. < 25%);1: Sit > Supine: Total A (helper does all/Pt. < 25%);1: Chair or W/C > Bed: Total A (helper does all/Pt. < 25%);1: Bed > Chair or W/C: Total A (helper does all/Pt. < 25%)  FIM - Locomotion: Wheelchair Distance: less than 5 feet Locomotion: Wheelchair: 1: Total Assistance/staff pushes wheelchair (Pt<25%) FIM - Locomotion: Ambulation Ambulation/Gait Assistance: Not tested (comment)  Comprehension Comprehension Mode: Auditory Comprehension: 1-Understands basic less than 25% of the time/requires cueing 75% of the time  Expression Expression Mode: Verbal  Social Interaction Social Interaction: 1-Interacts appropriately less than 25% of the time. May be withdrawn or combative.  Problem Solving Problem Solving: 2-Solves basic 25 - 49% of the time - needs direction more than half the time to initiate, plan or complete simple activities  Memory Memory: 2-Recognizes or recalls 25 - 49% of the time/requires cueing 51 -  75% of the time  Medical Problem List and Plan:  1. DVT Prophylaxis/Anticoagulation: Pharmaceutical: Lovenox  2. Pain Management: prn tylenol  3. Mood: continue Prozac for mood stabilization. Patient with cognitive deficits with poor/minimal insight---LCSW to follow for evaluation.  4. Neuropsych: This patient is not capable of making decisions on her own behalf.  5. HTN: Monitor with bid checks and adjust medications as indicated.  6. Recent LGIB with ABLA: Monitor H/H and stool guaiacs as now on ASA .  7. A fib: Will monitor heart rate with bid checks. Continue betapace.  8. Dysphagia: On dysphagia 1, nectar liquids. Decrease IVF to HS to avoid overload. Push nectars to maintain hydration. Check CXR for decreased BS on R and increased RR 9. Hypokalemia: Dilutional--Will add supplement.  10. Spasticity: PRAFO left lower extremity.  -ROM with therapy  start zanaflex at hs and monitor closely given her age and cognitive levels. Will continue to reassess.      LOS (Days) 3 A FACE TO FACE EVALUATION WAS PERFORMED  KIRSTEINS,ANDREW E 10/05/2013, 8:03 AM

## 2013-10-06 ENCOUNTER — Inpatient Hospital Stay (HOSPITAL_COMMUNITY): Payer: Medicare Other

## 2013-10-06 ENCOUNTER — Inpatient Hospital Stay (HOSPITAL_COMMUNITY): Payer: Medicare Other | Admitting: Speech Pathology

## 2013-10-06 ENCOUNTER — Inpatient Hospital Stay (HOSPITAL_COMMUNITY): Payer: Medicare Other | Admitting: Physical Therapy

## 2013-10-06 ENCOUNTER — Inpatient Hospital Stay (HOSPITAL_COMMUNITY): Payer: Medicare Other | Admitting: Occupational Therapy

## 2013-10-06 DIAGNOSIS — G8110 Spastic hemiplegia affecting unspecified side: Secondary | ICD-10-CM

## 2013-10-06 DIAGNOSIS — I6330 Cerebral infarction due to thrombosis of unspecified cerebral artery: Secondary | ICD-10-CM

## 2013-10-06 DIAGNOSIS — I69991 Dysphagia following unspecified cerebrovascular disease: Secondary | ICD-10-CM

## 2013-10-06 LAB — OCCULT BLOOD X 1 CARD TO LAB, STOOL: Fecal Occult Bld: NEGATIVE

## 2013-10-06 MED ORDER — TIZANIDINE HCL 4 MG PO TABS
4.0000 mg | ORAL_TABLET | Freq: Every day | ORAL | Status: DC
  Administered 2013-10-06: 4 mg via ORAL
  Filled 2013-10-06 (×2): qty 1

## 2013-10-06 MED ORDER — CEPHALEXIN 250 MG PO CAPS
250.0000 mg | ORAL_CAPSULE | Freq: Three times a day (TID) | ORAL | Status: DC
  Administered 2013-10-06 – 2013-10-08 (×7): 250 mg via ORAL
  Filled 2013-10-06 (×10): qty 1

## 2013-10-06 NOTE — Progress Notes (Signed)
Physical Therapy Session Note  Patient Details  Name: Tina Chambers MRN: [id number] Date of Birth: 07-10-1927  Today's Date: 10/06/2013 Time: 1017-1120 Time Calculation (min): 63 min  Short Term Goals: Week 1:  PT Short Term Goal 1 (Week 1): Pt will roll to R with side rail and max A.  PT Short Term Goal 2 (Week 1): Pt will roll to L with side rail and mod A.  PT Short Term Goal 3 (Week 1): Pt will transfer supine to edge of bed, edge of bed to supine with siderail and mod A.  PT Short Term Goal 4 (Week 1): Pt will transfer bed to chair, chair to bed to the R side with max A.  PT Short Term Goal 5 (Week 1): Pt will propel w/c 50 feet with R UE and LE, requiring min A.   Skilled Therapeutic Interventions/Progress Updates:   Pt received supine in bed returned from [facility] with daughter present. Pt lethargic upon arrival but became alert transferring supine>sit. Pt transferred supine>sit with HOB elevated total A +2 and scooted to EOB total A +2. Performed bed>w/c with slide board total A +2. Pt continues to demonstrate L neglect and pushing with RUE during transfers requiring max VC and facilitation for forward weight shift. Therapist propelled pt in w/c to therapy gym. Pt required total A for forward and lateral weight shift in w/c to position standing frame harness. Pt performed standing in standing frame x 15 minutes while performing cognitive task putting flowers in vase placed at midline with RUE. Pt able to sustain attention on task 5 seconds. Utilized screen to block pt view of therapy gym to reduce distraction. Pt required max-total VC for selective attention to grasp flower and place in vase. Pt unable to follow two step commands and increased time for one step command. Pt required max VC to utilize shoulder flexion to lift flower otherwise attempted to place flower in vase with elbow flexion/wrist extension with elbow on table. Pt required manual facilitation and VC for upright posture  and to relax grasp on edge of table with R hand. Pt looked to L side to x3 during entire session. Pt returned to sitting in w/c for blood draw. Pt returned to room and therapist repositioned pt in w/c with cushioning on L for optimal positioning. Pt left seated in w/c with daughter present.   Pt fatigued at end of session but more alert throughout therapy. Pt continues to demonstrate delayed processing and initiation.   Therapy Documentation Precautions:  Precautions Precautions: Fall Precaution Comments: L neglect Restrictions Weight Bearing Restrictions: No   Mobility: Bed Mobility Bed Mobility: Supine to Sit;Sitting - Scoot to Edge of Bed Supine to Sit: 1: +2 Total assist Supine to Sit Details: Manual facilitation for placement;Manual facilitation for weight shifting;Verbal cues for technique;Verbal cues for sequencing;Tactile cues for initiation Sitting - Scoot to Edge of Bed: 1: +2 Total assist Sitting - Scoot to Edge of Bed Details: Tactile cues for initiation;Verbal cues for technique;Verbal cues for sequencing;Manual facilitation for weight shifting;Manual facilitation for placement Transfers Transfers: Yes Lateral/Scoot Transfers: 1: +2 Total assist (with slideboard) Lateral/Scoot Transfer Details: Manual facilitation for placement;Manual facilitation for weight shifting;Verbal cues for technique;Verbal cues for sequencing;Tactile cues for posture;Tactile cues for placement;Tactile cues for weight shifting;Tactile cues for sequencing;Tactile cues for initiation  See FIM for current functional status  Therapy/Group: Individual Therapy  Jolette Lana 10/06/2013, 11:34 AM

## 2013-10-06 NOTE — Progress Notes (Signed)
I have reviewed and I agree with the following treatment note.  Xavia Kniskern Hall, PT, DPT  

## 2013-10-06 NOTE — Procedures (Signed)
The assessment and plan has been reviewed and SLP is in agreement. Jerry Clyne, M.A., CCC-SLP [phone]  

## 2013-10-06 NOTE — Procedures (Signed)
Objective Swallowing Evaluation: Modified Barium Swallowing Study  Patient Details  Name: Tina Chambers MRN: [id number] Date of Birth: 06-11-1927  Today's Date: 10/06/2013 Time: 0930-1000 Time Calculation (min): 30 min  Past Medical History:  Past Medical History  Diagnosis Date  . Hypertension   . Atrial fibrillation   . CHF (congestive heart failure)   . Hypothyroid   . Hyperlipidemia   . GIB (gastrointestinal bleeding)     recurrent  . Pelvic fracture    Past Surgical History: No past surgical history on file. HPI:  Tina Chambers is an 79 year-old female with history of HTN, recent pelvic fracture, A Fib--off blood thinners due to GIB (most recent ~ 2 weeks ago), CHF who was admitted on 09/29/13, being found with facial droop, right gaze preference and left sided weakness. CCT without evidence of bleed and patient underwent cerebral angiogram with complete revascularization of occluded R-MCA with IA tpa and thrombectomy with Solitaire device. Follow up CCT with evolving acute infarct R-MCA territory involving parietal and temporal lobe. She was extubated on 11/05 and started on Dys.1, nectar thick liquids via teaspoon due to significant pocketing with spillage as well as decrease LOC. Patient transferred to CIR.  Chest x-ray completed 11/10; suggest low-grade CHF with no focal pneumonia.  Head CT without contrast completed 11/10; expected evolution of R MCA infarct; stable mild mass effect and no associated hemorrhage; no new intracranial abnormality identified.  Patient made NPO due to overall lethargy and fatigue.  Dysphagia therapy and therapeutic trials performed at bedside; unable to rule out aspiration at bedside.  MBS completed on 10/06/13; see report for details.     Recommendation/Prognosis  Clinical Impression Dysphagia Diagnosis: Mild oral phase dysphagia;Moderate pharyngeal phase dysphagia Clinical impression: Patient demonstrates a moderate oral dysphagia and a  mild pharyngeal dysphagia. Impairments include: decreased oral cohesion/containment of bolus; delay swallow initiation; premature spillage into the vallecular space and lateral channels; residue from the base of tongue to the lateral channels. Patient demonstrated flash penetration with nectar-thick liquids via spoon and cup in addition to deep flash penetration with thin liquids via spoon; completion of swallow and multiple follow-up swallows cleared penetrants. Patient demonstrated cough x1 out of multiple trials; SLP suspects sensation impairments possibly impacted by arousal.  SLP provided Max multi-modal cueing to elicit multiple swallows per bite/sip; patient intermittently followed directions, but when able to they reduced overall residue.  Patient's performance was significantly, negatively impacted by her alertness, arousal, and her overall severe cognitive deficits.  Patient required Max multi-modal cueing to remain aroused during study.  Recommend therapeutic trials of nectar-thick liquids via teaspoon and Dys.1 trials with the SLP only to ensure utilization of safe swallow strategies and to closely monitor alertness, arousal, and attention.  SLP recommends continuing with current NPO orders until patient can sustain arousal and attention to safely participate in p.o. intake.  Swallow Evaluation Recommendations Diet Recommendations: NPO Medication Administration: Via alternative means Oral Care Recommendations: Oral care Q4 per protocol Follow up Recommendations: Skilled Nursing facility;24 hour supervision/assistance Prognosis Prognosis for Safe Diet Advancement: Fair Barriers to Reach Goals: Cognitive deficits Barriers/Prognosis Comment: Severe cognitive deficits; arousal and alertness significantly impacts safety of p.o. trials Individuals Consulted Consulted and Agree with Results and Recommendations: Patient unable/family or caregiver not available  SLP Assessment/Plan Short Term  Goals: Week 1: SLP Short Term Goal 1 (Week 1): Patient will perform pharyngeal strengthening exercieses with Max multimodal cueing. SLP Short Term Goal 2 (Week 1): Patient will recall basic, daily information  with the use of an external aid with Max multimodal cueing. SLP Short Term Goal 3 (Week 1): Patient will demonstrate basic problem solving with Max mulit-modal cueing. SLP Short Term Goal 4 (Week 1): Patient will utilize speech intelligibility strategie with Mod verbal cueing. SLP Short Term Goal 5 (Week 1): Patient will label 1 physical deficit post CVA with Max questioning and verbal cues. SLP Short Term Goal 6 (Week 1): Patient will sustain attention for ~2 minutes with Mod multimodal cueing.  General:  Date of Onset: 09/29/13 HPI: Tina Chambers is an 79 year-old female with history of HTN, recent pelvic fracture, A Fib--off blood thinners due to GIB (most recent ~ 2 weeks ago), CHF who was admitted on 09/29/13, being found with facial droop, right gaze preference and left sided weakness. CCT without evidence of bleed and patient underwent cerebral angiogram with complete revascularization of occluded R-MCA with IA tpa and thrombectomy with Solitaire device. Follow up CCT with evolving acute infarct R-MCA territory involving parietal and temporal lobe. She was extubated on 11/05 and started on Dys.1, nectar thick liquids via teaspoon due to significant pocketing with spillage as well as decrease LOC. Therapies evaluations done and CIR recommended by team.  Chest x-ray completed 11/10; suggest low-grade CHF with no focal pneumonia.  Head CT without contrast completed 11/10; expected evolution of R MCA infarct; stable mild mass effect and no associated hemorrhage; no new intracranial abnormality identified.  Dysphagia therapy and bedside trials completed with SLP in CIR.  MBS completed on 10/06/13; see report for details. Type of Study: Modified Barium Swallowing Study Reason for Referral:  Objectively evaluate swallowing function Previous Swallow Assessment: BSE 10/03/13 Diet Prior to this Study: Dysphagia 1 (puree);Nectar-thick liquids (via teaspoon) Temperature Spikes Noted: No Respiratory Status: Room air History of Recent Intubation: Yes Length of Intubations (days): 2 days Date extubated: 09/30/13 Behavior/Cognition: Lethargic;Hard of hearing Oral Cavity - Dentition: Adequate natural dentition (partial dentures) Oral Motor / Sensory Function: Impaired - see Bedside swallow eval Self-Feeding Abilities: Total assist Patient Positioning: Upright in chair Baseline Vocal Quality: Low vocal intensity;Breathy;Hoarse Volitional Cough: Weak Volitional Swallow: Able to elicit Anatomy: Within functional limits Pharyngeal Secretions: Not observed secondary MBS  Reason for Referral:  Objectively evaluate swallowing function   Oral Phase Oral Preparation/Oral Phase Oral Phase: Impaired Oral - Nectar Oral - Nectar Teaspoon: Weak lingual manipulation;Reduced posterior propulsion;Lingual/palatal residue;Delayed oral transit Oral - Nectar Cup: Weak lingual manipulation;Reduced posterior propulsion;Lingual/palatal residue;Delayed oral transit Oral - Thin Oral - Thin Teaspoon: Weak lingual manipulation;Lingual/palatal residue Oral - Solids Oral - Puree: Weak lingual manipulation;Holding of bolus;Lingual/palatal residue;Delayed oral transit Oral Phase - Comment Oral Phase - Comment: Overall moderate oral dysphagia; significantly impacted by severe cognitive deficits  Pharyngeal Phase  Pharyngeal Phase Pharyngeal Phase: Impaired Pharyngeal - Nectar Pharyngeal - Nectar Teaspoon: Delayed swallow initiation;Premature spillage to valleculae;Penetration/Aspiration during swallow;Pharyngeal residue - valleculae;Lateral channel residue;Compensatory strategies attempted (Comment) (flash penetration; multiple swallows) Penetration/Aspiration details (nectar teaspoon): Material enters  airway, remains ABOVE vocal cords then ejected out Pharyngeal - Nectar Cup: Delayed swallow initiation;Premature spillage to valleculae;Lateral channel residue;Pharyngeal residue - valleculae;Penetration/Aspiration during swallow;Compensatory strategies attempted (Comment) (flash penetration; multiple swallows) Penetration/Aspiration details (nectar cup): Material enters airway, remains ABOVE vocal cords then ejected out Pharyngeal - Thin Pharyngeal - Thin Teaspoon: Delayed swallow initiation;Premature spillage to valleculae;Premature spillage to pyriform sinuses;Penetration/Aspiration during swallow;Pharyngeal residue - valleculae;Compensatory strategies attempted (Comment);Lateral channel residue (multiple swallows) Penetration/Aspiration details (thin teaspoon): Material enters airway, remains ABOVE vocal cords then ejected out Pharyngeal - Solids  Pharyngeal - Puree: Delayed swallow initiation;Premature spillage to valleculae;Pharyngeal residue - valleculae;Compensatory strategies attempted (Comment) (multiple swallows) Pharyngeal Phase - Comment Pharyngeal Comment: Overall mild pharyngeal dysphagia; significantly impacted by severe cognitive deficits  Cervical Esophageal Phase  Cervical Esophageal Phase Cervical Esophageal Phase: Cardell Peach, Rhyatt Muska E 10/06/2013, 11:03 AM

## 2013-10-06 NOTE — Progress Notes (Signed)
Occupational Therapy Session Note  Patient Details  Name: Tina Chambers MRN: [id number] Date of Birth: 1927-10-27  Today's Date: 10/06/2013 Time: 0730-0823 and 1330-1400 Time Calculation (min): 53 min and 30 min  Short Term Goals: Week 1:  OT Short Term Goal 1 (Week 1): Patient will perform bed mobility of roling left <>right with maximal assistance in order to assist caregiver in BADL OT Short Term Goal 2 (Week 1): Patient will maintain static sitting balance (edge of bed) for at least 5 minutes with maximal assistance in order to work towards edge of bed level BADLs OT Short Term Goal 3 (Week 1): Patient will complete grooming tasks at sink with moderate assistance OT Short Term Goal 4 (Week 1): Patient will complete self-feeding task with minimal assistance and moderate verbal cues to stay on task  Skilled Therapeutic Interventions/Progress Updates:    1) Pt seen for ADL retraining with focus on bed mobility, static sitting balance, and following directions.  Pt asleep in bed upon arrival, but easily aroused to calling name.  Pt required total assist with bed mobility with +2 to complete rolling Rt and Lt in bed for periarea hygiene.  Physical assistance to turn head to Lt to promote attention to Lt while rolling.  Hand over hand assist given to initiate washing face and then to sequence UB bathing.  Pt resistant to washing Lt side of face and Lt arm with active pushing against physical assist from therapist.  Engaged in UB bathing while seated EOB, pt required total assist to maintain sitting balance, as she would push to Lt, while receiving cues from therapist and hand over hand assist for initiation and sequencing with UB bathing and dressing tasks.  Pt maintained arousal during session while seated EOB, once returned to supine and positioned in bed pt immediately fell asleep.   2) Pt seen for 1:1 OT with focus on bed mobility, sitting balance, initiation, and scanning to Lt.  Pt in  bed upon arrival, performed supine to sit with +2 assist for forward weight shifting and initiation.  Engaged in static sitting at EOB with total assist as pt tends to push to Lt.  Engaged in task with scanning to Lt to obtain items, pt required increased time, tactile cues to position head, and hand over hand assist to reach for item.  Pt unable to attend, nor motivated to attend to this task.  Modified task with placing table top mirror at midline and encouraging pt to locate herself in mirror.  Once located self, encouraged pt to locate comb just to Lt of midline.  Pt required hand over hand assist to reach for comb with assist to progress against pushing, once comb in hand pt ceased pushing and immediately went to comb hair with only 1 verbal cue.  Pt tends to perseverate on rearranging bed sheets and items to Rt, requiring decreased distractions external distractions to promote increased attention to task.  Pt continues to attend for very brief periods of time (seconds).  Pt returned to bed and daughter present.  Therapy Documentation Precautions:  Precautions Precautions: Fall Precaution Comments: L neglect Restrictions Weight Bearing Restrictions: No General: General Amount of Missed OT Time (min): 7 Minutes Vital Signs: Therapy Vitals Temp: 98.6 F (37 C) Temp src: Oral Pulse Rate: 82 Resp: 18 BP: 137/58 mmHg Patient Position, if appropriate: Lying Oxygen Therapy SpO2: 99 % O2 Device: None (Room air) Pain: Pain Assessment Pain Assessment: Faces Faces Pain Scale: Hurts little more Pain Location: Head  Pain Descriptors / Indicators: Aching Pain Intervention(s): Medication (See eMAR)  See FIM for current functional status  Therapy/Group: Individual Therapy  Rosalio Loud 10/06/2013, 8:24 AM

## 2013-10-06 NOTE — Progress Notes (Signed)
Subjective/Complaints: 79 y.o. female with history of HTN, recent pelvic fracture, A Fib--off blood thinners due to GIB (most recent ~ 2 weeks ago), CHF who was admitted on 09/29/13 past being found with facial droop, right gaze preference and left sided weakness. CCT without evidence of bleed and patient underwent cerebral angiogram with complete revascularization of occluded R-MCA with IA tpa and thrombectomy with Solitaire device. Follow up CCT with evolving acute infarct R-MCA territory involving parietal and temporal lobe. 2D echo with EF 50-55% and no wall abnormality, grade 1 diastolic dysfunction. Carotid dopplers with 40-59% R-ICA stenosis and 1- 39% L-ICA stenosis. Extubated on 11/05 and started on D 1, nectar thick liquids due to significant pocketing with spillage as well as decrease LOC. Patient continues with left sided weakness, left inattention with right gaze preference, poor short term memory  Breathing better per pt Received IV Lasix 20 mg on 10/05/2013 Chest x-ray mild effusion on left no evidence of pneumonia  Patient denies any pains patient denies any bowel or bladder issues Review of Systems - unable to do ROS secondary to mental status  Objective: Vital Signs: Blood pressure 137/58, pulse 82, temperature 98.6 F (37 C), temperature source Oral, resp. rate 18, height 5\' 5"  (1.651 m), weight 61.689 kg (136 lb), SpO2 99.00%. Dg Chest 2 View  10/05/2013   CLINICAL DATA:  The kidney a and decreased right sided low breath sounds  EXAM: CHEST  2 VIEW  COMPARISON:  September 29, 2013.  FINDINGS: The patient has undergone interval extubation of the trachea and of the esophagus with placement of a Dobbhoff type feeding tube. The radiodense bulb lies in the region of the gastric cardia with a loop of the catheter positioned more distally. The cardiac silhouette is enlarged. The pulmonary vascularity is prominent centrally. The pulmonary interstitial markings are mildly increased today as  compared to the previous study. There is no significant pleural effusion. The observed portions of the bony thorax exhibit no acute abnormalities.  IMPRESSION: The findings suggest low-grade CHF. There is no focal pneumonia.   Electronically Signed   By: David  [place]   On: 10/05/2013 14:27   Ct Head Wo Contrast  10/05/2013   CLINICAL DATA:  86-year- female with increased weakness and fatigue. Recent right MCA infarct. Initial encounter.  EXAM: CT HEAD WITHOUT CONTRAST  TECHNIQUE: Contiguous axial images were obtained from the base of the skull through the vertex without intravenous contrast.  COMPARISON:  Brain MRI 10/01/2013 and earlier.  FINDINGS: Right side nasoenteric tube. Right side hearing aid. Visualized paranasal sinuses and mastoids are clear. *INSERT* bones No acute orbit or scalp soft tissue findings.  Hypodensity in the right MCA territory corresponding to the area of diffusion abnormality on 10/01/2013. Mild associated mass effect, primarily at the basal ganglia, stable. Mild effacement of the right lateral ventricle. Basilar cisterns remain patent. No midline shift. No acute intracranial hemorrhage identified.  Superimposed chronic right full amic lacunar infarct. Stable and normal gray-white matter differentiation in the left hemisphere and posterior fossa.  IMPRESSION: 1. Expected evolution of right MCA infarct. Stable mild mass effect and no associated hemorrhage.  2. No new intracranial abnormality identified.   Electronically Signed   By: Augusto Gamble M.D.   On: 10/05/2013 16:13   Results for orders placed during the hospital encounter of 10/02/13 (from the past 72 hour(s))  URINALYSIS, ROUTINE W REFLEX MICROSCOPIC     Status: Abnormal   Collection Time    10/03/13 10:52 AM  Result Value Range   Color, Urine YELLOW  YELLOW   APPearance CLEAR  CLEAR   Specific Gravity, Urine 1.017  1.005 - 1.030   pH 6.0  5.0 - 8.0   Glucose, UA NEGATIVE  NEGATIVE mg/dL   Hgb urine dipstick  NEGATIVE  NEGATIVE   Bilirubin Urine NEGATIVE  NEGATIVE   Ketones, ur NEGATIVE  NEGATIVE mg/dL   Protein, ur 30 (*) NEGATIVE mg/dL   Urobilinogen, UA 1.0  0.0 - 1.0 mg/dL   Nitrite NEGATIVE  NEGATIVE   Leukocytes, UA NEGATIVE  NEGATIVE  URINE CULTURE     Status: None   Collection Time    10/03/13 10:52 AM      Result Value Range   Specimen Description URINE, CATHETERIZED     Special Requests NONE     Culture  Setup Time       Value: 10/03/2013 21:39     Performed at [facility] Count       Value: NO GROWTH     Performed at [facility]   Culture       Value: NO GROWTH     Performed at [facility]   Report Status 10/04/2013 FINAL    URINE MICROSCOPIC-ADD ON     Status: None   Collection Time    10/03/13 10:52 AM      Result Value Range   Squamous Epithelial / LPF RARE  RARE   WBC, UA 0-2  <3 WBC/hpf  CBC     Status: Abnormal   Collection Time    10/04/13  5:25 AM      Result Value Range   WBC 8.0  4.0 - 10.5 K/uL   RBC 3.85 (*) 3.87 - 5.11 MIL/uL   Hemoglobin 9.6 (*) 12.0 - 15.0 g/dL   HCT 82.9 (*) 56.2 - 13.0 %   MCV 81.8  78.0 - 100.0 fL   MCH 24.9 (*) 26.0 - 34.0 pg   MCHC 30.5  30.0 - 36.0 g/dL   RDW 86.5 (*) 78.4 - 69.6 %   Platelets 135 (*) 150 - 400 K/uL  OCCULT BLOOD X 1 CARD TO LAB, STOOL     Status: None   Collection Time    10/05/13  4:50 AM      Result Value Range   Fecal Occult Bld NEGATIVE  NEGATIVE  CBC WITH DIFFERENTIAL     Status: Abnormal   Collection Time    10/05/13  6:30 AM      Result Value Range   WBC 8.8  4.0 - 10.5 K/uL   RBC 4.16  3.87 - 5.11 MIL/uL   Hemoglobin 10.2 (*) 12.0 - 15.0 g/dL   HCT 29.5 (*) 28.4 - 13.2 %   MCV 80.5  78.0 - 100.0 fL   MCH 24.5 (*) 26.0 - 34.0 pg   MCHC 30.4  30.0 - 36.0 g/dL   RDW 44.0 (*) 10.2 - 72.5 %   Platelets 151  150 - 400 K/uL   Neutrophils Relative % 65  43 - 77 %   Neutro Abs 5.7  1.7 - 7.7 K/uL   Lymphocytes Relative 22  12 - 46 %   Lymphs Abs 2.0  0.7 -  4.0 K/uL   Monocytes Relative 12  3 - 12 %   Monocytes Absolute 1.0  0.1 - 1.0 K/uL   Eosinophils Relative 0  0 - 5 %   Eosinophils Absolute 0.0  0.0 - 0.7  K/uL   Basophils Relative 0  0 - 1 %   Basophils Absolute 0.0  0.0 - 0.1 K/uL  COMPREHENSIVE METABOLIC PANEL     Status: Abnormal   Collection Time    10/05/13  6:30 AM      Result Value Range   Sodium 137  135 - 145 mEq/L   Potassium 3.8  3.5 - 5.1 mEq/L   Chloride 101  96 - 112 mEq/L   CO2 30  19 - 32 mEq/L   Glucose, Bld 110 (*) 70 - 99 mg/dL   BUN 9  6 - 23 mg/dL   Creatinine, Ser 1.61 (*) 0.50 - 1.10 mg/dL   Calcium 9.1  8.4 - 09.6 mg/dL   Total Protein 6.1  6.0 - 8.3 g/dL   Albumin 2.7 (*) 3.5 - 5.2 g/dL   AST 24  0 - 37 U/L   ALT 15  0 - 35 U/L   Alkaline Phosphatase 81  39 - 117 U/L   Total Bilirubin 0.5  0.3 - 1.2 mg/dL   GFR calc non Af Amer 88 (*) >90 mL/min   GFR calc Af Amer >90  >90 mL/min   Comment: (NOTE)     The eGFR has been calculated using the CKD EPI equation.     This calculation has not been validated in all clinical situations.     eGFR's persistently <90 mL/min signify possible Chronic Kidney     Disease.  OCCULT BLOOD X 1 CARD TO LAB, STOOL     Status: None   Collection Time    10/05/13  5:36 PM      Result Value Range   Fecal Occult Bld NEGATIVE  NEGATIVE  URINALYSIS, ROUTINE W REFLEX MICROSCOPIC     Status: Abnormal   Collection Time    10/05/13  5:37 PM      Result Value Range   Color, Urine YELLOW  YELLOW   APPearance CLOUDY (*) CLEAR   Specific Gravity, Urine 1.010  1.005 - 1.030   pH 6.5  5.0 - 8.0   Glucose, UA NEGATIVE  NEGATIVE mg/dL   Hgb urine dipstick TRACE (*) NEGATIVE   Bilirubin Urine NEGATIVE  NEGATIVE   Ketones, ur NEGATIVE  NEGATIVE mg/dL   Protein, ur NEGATIVE  NEGATIVE mg/dL   Urobilinogen, UA 1.0  0.0 - 1.0 mg/dL   Nitrite NEGATIVE  NEGATIVE   Leukocytes, UA TRACE (*) NEGATIVE  URINE MICROSCOPIC-ADD ON     Status: Abnormal   Collection Time    10/05/13  5:37 PM       Result Value Range   WBC, UA 7-10  <3 WBC/hpf   Bacteria, UA MANY (*) RARE   Urine-Other MUCOUS PRESENT       HEENT: normal Cardio: RRR and no murmur Resp: decreased BS R base and mildly labored mouth breathing,  GI: BS positive and non distended Extremity:  Pulses positive and No Edema Skin:   Intact Neuro: Lethargic, Anxious and Dysarthric Musc/Skel:  Other hamstring spasm Gen NAD Flexor withdrawal spasticity left lower extremity, sustained clonus left ankle Motor strength is 0/5 left upper extremity trace hip extension left lower extremity Right upper extremity 4+/5 in the deltoid, bicep, tricep, grip right lower extremity 4/5 in the hip flexor knee extensor ankle dorsiflexor plantar flexor Sensation is absent to light touch in the left upper extremity but withdraws to pain Left lower extremity withdrawal to pain, absent light touch Right upper and right lower light touch is intact  Patient appears to have a field cut on the left side but difficult to test. She does turn her head to look at objects on the left side when cued    Assessment/Plan: 1. Functional deficits secondary to R MCA infarct with L HP which require 3+ hours per day of interdisciplinary therapy in a comprehensive inpatient rehab setting. Physiatrist is providing close team supervision and 24 hour management of active medical problems listed below. Physiatrist and rehab team continue to assess barriers to discharge/monitor patient progress toward functional and medical goals. FIM: FIM - Bathing Bathing: 1: Two helpers  FIM - Upper Body Dressing/Undressing Upper body dressing/undressing: 1: Two helpers FIM - Lower Body Dressing/Undressing Lower body dressing/undressing: 1: Two helpers  FIM - Toileting Toileting: 0: Activity did not occur  FIM - Archivist Transfers: 0-Activity did not occur  FIM - Banker Devices: Arm rests Bed/Chair Transfer:  1: Sit > Supine: Total A (helper does all/Pt. < 25%);1: Bed > Chair or W/C: Total A (helper does all/Pt. < 25%);1: Chair or W/C > Bed: Total A (helper does all/Pt. < 25%);1: Two helpers  FIM - Locomotion: Wheelchair Distance: 150 Locomotion: Wheelchair: 1: Total Assistance/staff pushes wheelchair (Pt<25%) FIM - Locomotion: Ambulation Locomotion: Ambulation Assistive Devices: Other (comment) (HHA) Ambulation/Gait Assistance: 1: +2 Total assist Locomotion: Ambulation: 1: Two helpers  Comprehension Comprehension Mode: Auditory Comprehension: 2-Understands basic 25 - 49% of the time/requires cueing 51 - 75% of the time  Expression Expression Mode: Verbal Expression: 2-Expresses basic 25 - 49% of the time/requires cueing 50 - 75% of the time. Uses single words/gestures.  Social Interaction Social Interaction Mode: Asleep Social Interaction: 2-Interacts appropriately 25 - 49% of time - Needs frequent redirection.  Problem Solving Problem Solving Mode: Asleep Problem Solving: 1-Solves basic less than 25% of the time - needs direction nearly all the time or does not effectively solve problems and may need a restraint for safety  Memory Memory Mode: Asleep Memory: 2-Recognizes or recalls 25 - 49% of the time/requires cueing 51 - 75% of the time  Medical Problem List and Plan:  1. DVT Prophylaxis/Anticoagulation: Pharmaceutical: Lovenox  2. Pain Management: prn tylenol  3. Mood: continue Prozac for mood stabilization. Patient with cognitive deficits with poor/minimal insight---LCSW to follow for evaluation.  4. Neuropsych: This patient is not capable of making decisions on her own behalf.  5. HTN: Monitor with bid checks and adjust medications as indicated.  6. Recent LGIB with ABLA: Monitor H/H and stool guaiacs as now on ASA .  7. A fib: Will monitor heart rate with bid checks. Continue betapace.  8. Dysphagia: On dysphagia 1, nectar liquids. D/C IVF  avoid overload. Push nectars to  maintain hydration.  9. Hypokalemia: Dilutional--Will add supplement.  10. Spasticity: PRAFO left lower extremity.  -ROM with therapy  -Zanaflex Qhs for flexor spasms, increase to 4mg  qhs 11.  Hx of Grade 1 diastolic CHF, responded to Lasix.  Resume demadex, monitor for azotemia 12.  Mental status changes related to UTI and CHF. CT head with normal evolution of infarct. Expect improvements with treatment of UTI and CHF   LOS (Days) 4 A FACE TO FACE EVALUATION WAS PERFORMED  Tina Chambers E 10/06/2013, 7:31 AM

## 2013-10-06 NOTE — Progress Notes (Signed)
Speech Language Pathology Daily Session Note  Patient Details  Name: Tina Chambers MRN: [id number] Date of Birth: 11/29/1926  Today's Date: 10/06/2013 Time: 1415-1500 Time Calculation (min): 45 min  Short Term Goals: Week 1: SLP Short Term Goal 1 (Week 1): Patient will perform pharyngeal strengthening exercieses with Max multimodal cueing. SLP Short Term Goal 2 (Week 1): Patient will recall basic, daily information with the use of an external aid with Max multimodal cueing. SLP Short Term Goal 3 (Week 1): Patient will demonstrate basic problem solving with Max mulit-modal cueing. SLP Short Term Goal 4 (Week 1): Patient will utilize speech intelligibility strategie with Mod verbal cueing. SLP Short Term Goal 5 (Week 1): Patient will label 1 physical deficit post CVA with Max questioning and verbal cues. SLP Short Term Goal 6 (Week 1): Patient will sustain attention for ~2 minutes with Mod multimodal cueing.  Skilled Therapeutic Interventions: Treatment focused on cognitive goals. SLP facilitated session with Total A for orientation to person, place, and situation. Pt required Max cues for brief periods of sustained attention during structured cognitive tasks. SLP provided Total A for attention to left visual field. She demonstrated intellectual awareness of physical changes by stating that "something happened to my left." Pt was perseverative throughout session, talking about colors. Continue plan of care.   FIM:  Comprehension Comprehension Mode: Auditory Comprehension: 1-Understands basic less than 25% of the time/requires cueing 75% of the time Expression Expression Mode: Verbal Expression: 2-Expresses basic 25 - 49% of the time/requires cueing 50 - 75% of the time. Uses single words/gestures. Social Interaction Social Interaction: 2-Interacts appropriately 25 - 49% of time - Needs frequent redirection. Problem Solving Problem Solving: 1-Solves basic less than 25% of the time -  needs direction nearly all the time or does not effectively solve problems and may need a restraint for safety Memory Memory: 1-Recognizes or recalls less than 25% of the time/requires cueing greater than 75% of the time  Pain Pain Assessment Pain Assessment: Faces Faces Pain Scale: No hurt  Therapy/Group: Individual Therapy  Maxcine Ham 10/06/2013, 3:11 PM  Maxcine Ham, M.A. CCC-SLP [phone]

## 2013-10-07 ENCOUNTER — Inpatient Hospital Stay (HOSPITAL_COMMUNITY): Payer: Medicare Other | Admitting: *Deleted

## 2013-10-07 ENCOUNTER — Inpatient Hospital Stay (HOSPITAL_COMMUNITY): Payer: Medicare Other | Admitting: Physical Therapy

## 2013-10-07 ENCOUNTER — Encounter (HOSPITAL_COMMUNITY): Payer: Medicare Other | Admitting: Occupational Therapy

## 2013-10-07 LAB — OCCULT BLOOD X 1 CARD TO LAB, STOOL: Fecal Occult Bld: NEGATIVE

## 2013-10-07 LAB — BASIC METABOLIC PANEL
CO2: 31 mEq/L (ref 19–32)
Chloride: 97 mEq/L (ref 96–112)
GFR calc Af Amer: >90 mL/min (ref >90)
GFR calc non Af Amer: 82 mL/min — ABNORMAL LOW (ref >90)
Glucose, Bld: 141 mg/dL — ABNORMAL HIGH (ref 70–99)
Potassium: 3.4 mEq/L — ABNORMAL LOW (ref 3.5–5.1)
Sodium: 138 mEq/L (ref 135–145)

## 2013-10-07 MED ORDER — TIZANIDINE HCL 4 MG PO TABS
6.0000 mg | ORAL_TABLET | Freq: Every day | ORAL | Status: DC
  Administered 2013-10-07 – 2013-10-14 (×8): 6 mg via ORAL
  Filled 2013-10-07 (×9): qty 1

## 2013-10-07 NOTE — Progress Notes (Signed)
The skilled treatment note has been reviewed and SLP is in agreement. Alysabeth Scalia, M.A., CCC-SLP [phone]  

## 2013-10-07 NOTE — Progress Notes (Signed)
Social Work Patient ID: Tina Chambers, female   DOB: 11-27-1926, 79 y.o.   MRN: [id number] Met with pt's four children-three son's and one daughter to go over team conference goals-mod assist and targeted discharge date-11/28. All are very concerned about her not being on a blood thinner with her history of atrial fib-requested to have a cardiologist see her while here. Will inform Pam-PA and MD about this request.  Will also ask them to follow up with the family.  All want the best for their mother and do not want her pushed too much. Aware of the rehab requirement of three hours of therapy but also that it can be spaced out to allow rest breaks for her.  They plan to look at area Nursing facilities and want the Best fit of rehab and care for her.  Have given them a NH list and my card to contact myself of questions or concerns arise.  All acknowledge she looks better today and is more aware. Will continue to work on discharge plans.

## 2013-10-07 NOTE — Progress Notes (Signed)
Physical Therapy Session Note  Patient Details  Name: Tina Chambers MRN: [id number] Date of Birth: 1927/10/12  Today's Date: 10/07/2013 Time: 1330 ([place] with SLP [id number] Time Calculation (min): 15 min  Short Term Goals: Week 1:  PT Short Term Goal 1 (Week 1): Pt will roll to R with side rail and max A.  PT Short Term Goal 2 (Week 1): Pt will roll to L with side rail and mod A.  PT Short Term Goal 3 (Week 1): Pt will transfer supine to edge of bed, edge of bed to supine with siderail and mod A.  PT Short Term Goal 4 (Week 1): Pt will transfer bed to chair, chair to bed to the R side with max A.  PT Short Term Goal 5 (Week 1): Pt will propel w/c 50 feet with R UE and LE, requiring min A.   Skilled Therapeutic Interventions/Progress Updates:    Patient received supine in bed. Co-treatment with SLP with focus on static sitting balance and postural control in sitting during PO intake. Additional emphasis on decreasing Pusher tendencies, functional transfers and attention to the L. Patient supine>sit with HOB elevated and use of bedrails with +2 assist. Sitting edge of bed, patient demonstrates Pusher tendencies (pushes with R UE to the L), forward flexed posture, and L neglect. Patient with improved posture/decreased Pushing when patient's R hand holding therapist's hand. Emphasis on improving upright posture and midline orientation with PO intake during SLP treatment. During break from PO intake, patient performs sidelying on R elbow to decrease pushing L and attempted increase in attention to the L.  Squat pivot transfer bed>wheelchair (going R) with totalA x1 person. Patient does not initiate or actively participate in transfer. While seated in wheelchair, activity to increase attention to L, initiation, sequencing, and command following by opening a letter that was sent to her. Patient left seated in wheelchair with seatbelt donned in room with nurse tech present.  Therapy  Documentation Precautions:  Precautions Precautions: Fall Precaution Comments: L neglect; NG tube; maintain HOB >40 deg Restrictions Weight Bearing Restrictions: No Pain: Pain Assessment Pain Assessment: No/denies pain Pain Score: 0-No pain Locomotion : Ambulation Ambulation/Gait Assistance: Not tested (comment)   See FIM for current functional status  Therapy/Group: Co-Treatment with SLP  Chipper Herb. Charissa Knowles, PT, DPT 10/07/2013, 3:44 PM

## 2013-10-07 NOTE — Patient Care Conference (Signed)
Inpatient RehabilitationTeam Conference and Plan of Care Update Date: 10/07/2013   Time: 11:30 AM    Patient Name: Tina Chambers      Medical Record Number: [id number]  Date of Birth: Apr 09, 1927 Sex: Female         Room/Bed: 4W08C/4W08C-01 Payor Info: Payor: MEDICARE / Plan: MEDICARE PART A AND B / Product Type: *No Product type* /    Admitting Diagnosis: rt cva  Admit Date/Time:  10/02/2013  4:51 PM Admission Comments: No comment available   Primary Diagnosis:  Acute right MCA stroke Principal Problem: Acute right MCA stroke  Patient Active Problem List   Diagnosis Date Noted  . Acute right MCA stroke 10/02/2013  . Atrial fibrillation 10/02/2013  . Essential hypertension, benign 10/02/2013  . Other and unspecified hyperlipidemia 10/02/2013  . Encounter for long-term (current) use of medications 10/02/2013  . Acute ischemic stroke 09/29/2013  . Acute respiratory failure 09/29/2013    Expected Discharge Date: Expected Discharge Date: 10/23/13  Team Members Present: Physician leading conference: Dr. Claudette Laws Social Worker Present: Dossie Der, LCSW Nurse Present: Other (comment) Kennyth Arnold lemmings-RN) PT Present: Edman Circle, PT OT Present: Rosalio Loud, OT;Kris Gellert, Heath Lark, OT SLP Present: Fae Pippin, SLP PPS Coordinator present : Tora Duck, RN, CRRN     Current Status/Progress Goal Weekly Team Focus  Medical   UTI, sens pnd, repeat CT normal evolution of R MCA infarct, confusion mainly related to CVA with medical overlay  maintain stability to allow full rehab participation  followup diagnostic work up for MS changes   Bowel/Bladder   incontinent bladder and bowel, In/out cath q 6hrs-no urge to void   To continent of bladder and  bowel  develop the urge to void   Swallow/Nutrition/ Hydration   NPO; Total assist with therapeutic trials  least restrictic p.o. intake with Mod assist  Therapeutic trials of nectar-thick liquids via spoon and puree    ADL's   +2 total assist with all mobility and self-care tasks  mod assist transfers, bathing, and LB dressing, min assist dressing and sitting balance  arousal, attention to task and Lt side, postural control   Mobility   +2 total A  min A basic transfers; max A standing and ambulation  Arousal, attention to L side, postural control   Communication   Mod assist  Min assist  increase use of intelligibility strategies, i.e. increase loudness   Safety/Cognition/ Behavioral Observations  Total to Max assist  Mod assist  Increase attention, awareness, orientation   Pain   c/o pain headache, prn med given  <3  assess aand monitor pain q shift   Skin   Brusing in groin and hip area, abrasion on left knee-oam applied  No skin breakdown  Monitor and assess skin q shift      *See Care Plan and progress notes for long and short-term goals.  Barriers to Discharge: heavy assist ,NPO with Panda    Possible Resolutions to Barriers:  upgrade swallow vs PEG, cont rehab    Discharge Planning/Teaching Needs:  Family awaiting team's recommendations-probable SNF from here, will be too much care to return to ALF      Team Discussion:  Doing better today-more alert.  Treating UTI.  MBS-will try nectar with therapy only.  Alertness and issue at [facility].  Severe flexor withdrawal-MD trying zanaflex for this. Probable NHP due to amount of care pt will require at discharge.  Revisions to Treatment Plan:  Probable NHP   Continued Need for  Acute Rehabilitation Level of Care: The patient requires daily medical management by a physician with specialized training in physical medicine and rehabilitation for the following conditions: Daily direction of a multidisciplinary physical rehabilitation program to ensure safe treatment while eliciting the highest outcome that is of practical value to the patient.: Yes Daily analysis of laboratory values and/or radiology reports with any subsequent need for medication  adjustment of medical intervention for : Neurological problems;Other  Lucy Chris 10/07/2013, 2:52 PM

## 2013-10-07 NOTE — Progress Notes (Signed)
Speech Language Pathology Daily Session Note  Patient Details  Name: Tina Chambers MRN: [id number] Date of Birth: [birth year]  Today's Date: 10/07/2013 Time: 1345-1430 Time Calculation (min): 45 min  Short Term Goals: Week 1: SLP Short Term Goal 1 (Week 1): Patient will perform pharyngeal strengthening exercieses with Max multimodal cueing. SLP Short Term Goal 2 (Week 1): Patient will recall basic, daily information with the use of an external aid with Max multimodal cueing. SLP Short Term Goal 3 (Week 1): Patient will demonstrate basic problem solving with Max mulit-modal cueing. SLP Short Term Goal 4 (Week 1): Patient will utilize speech intelligibility strategie with Mod verbal cueing. SLP Short Term Goal 5 (Week 1): Patient will label 1 physical deficit post CVA with Max questioning and verbal cues. SLP Short Term Goal 6 (Week 1): Patient will sustain attention for ~2 minutes with Mod multimodal cueing.  Skilled Therapeutic Interventions: Patient participated in skilled co-treatment session with PT.  SLP skilled intervention focused on speech, cognition, and dysphagia goals.  SLP facilitated session with trials of Dys.1 and nectar-thick liquids via spoon; patient demonstrated reflexive cough x2 during liquid trials which SLP suspects reduced possible penetrants.  Coughs were associated with decreased attention and distractions in the environment; it is recommended that patient consume all p.o. trials in an environment with little distractions.  Patient required Max verbal cues to utilize safe swallow strategy of multiple swallows and Mod multi-modal cueing to sustain attention after every third bite.  Patient was oriented to place and situation with Mod multi-modal cueing from the SLP.  SLP also facilitated session with having patient utilized speech intelligibility strategies; she required Max verbal cueing throughout the session to utilize strategies.  Patient's overall arousal and  alertness was facilitated by PT's participation and expertise throughout the session; p.o. trials in addition to cognitive functioning were positively impacted.  Continue with current plan of care.   FIM:  Comprehension Comprehension Mode: Auditory Comprehension: 2-Understands basic 25 - 49% of the time/requires cueing 51 - 75% of the time Expression Expression Mode: Verbal Expression: 2-Expresses basic 25 - 49% of the time/requires cueing 50 - 75% of the time. Uses single words/gestures. Social Interaction Social Interaction: 2-Interacts appropriately 25 - 49% of time - Needs frequent redirection. Problem Solving Problem Solving: 1-Solves basic less than 25% of the time - needs direction nearly all the time or does not effectively solve problems and may need a restraint for safety Memory Memory: 1-Recognizes or recalls less than 25% of the time/requires cueing greater than 75% of the time FIM - Eating Eating Activity: 3: Helper brings food to mouth every scoop;1: Helper performs IV, parenteral, or tube feeding  Pain Pain Assessment Pain Assessment: Faces Pain Score: 0-No pain Faces Pain Scale: Hurts a little bit Pain Type: Acute pain Pain Location: Back Pain Orientation: Lower Pain Descriptors / Indicators: Aching Pain Intervention(s): Repositioned Multiple Pain Sites: No  Therapy/Group: Individual Therapy  Levora Angel 10/07/2013, 4:11 PM

## 2013-10-07 NOTE — Progress Notes (Signed)
Occupational Therapy Session Note  Patient Details  Name: Tina Chambers MRN: [id number] Date of Birth: Jul 23, 1927  Today's Date: 10/07/2013 Time: 0900-1000 Time Calculation (min): 60 min  Short Term Goals: Week 1:  OT Short Term Goal 1 (Week 1): Patient will perform bed mobility of roling left <>right with maximal assistance in order to assist caregiver in BADL OT Short Term Goal 2 (Week 1): Patient will maintain static sitting balance (edge of bed) for at least 5 minutes with maximal assistance in order to work towards edge of bed level BADLs OT Short Term Goal 3 (Week 1): Patient will complete grooming tasks at sink with moderate assistance OT Short Term Goal 4 (Week 1): Patient will complete self-feeding task with minimal assistance and moderate verbal cues to stay on task  Skilled Therapeutic Interventions/Progress Updates:    Pt seen for ADL retraining with focus on bed mobility, static sitting balance, midline orientation, scanning to Lt, standing balance, and transfers.  Pt in bed upon arrival, LLE tucked up underneath RLE due to severe flexor withdrawal at rest, engaged in passive stretching to straighten LLE.  Engaged in bathing and dressing seated at EOB with +2 to maintain static sitting balance, due to extreme pushing to Lt in sitting.  Pt with severe Rt gaze preference this session, requiring verbal cues, increased wait time, and tactile cues to promote midline orientation and scanning to Lt to wash LUE and LLE.  Pt unable to assist in donning shirt or pants this session, with resistance to providing hand over hand assist to RUE to attempt to engage in dressing tasks.  Sit to stand with +2 to pull pants over hips with pt pushing to Lt in standing while therapist provided manual facilitation to promote weight shifting and midline orientation while 2nd person pulled pants over hips.  Slide board transfer to Rt with +2 for safety as pt pushing away with RUE and resistant to  repositioning arm in less weight bearing position.  Engaged in oral care in seated position in w/c at sink with use of mirror for midline orientation. Pt seated in w/c with quick release belt awaiting arrival of PT.  Therapy Documentation Precautions:  Precautions Precautions: Fall Precaution Comments: L neglect; NG tube; maintain HOB >40 deg Restrictions Weight Bearing Restrictions: No Pain: Pain Assessment Pain Assessment: Faces Faces Pain Scale: Hurts a little bit  See FIM for current functional status  Therapy/Group: Individual Therapy  Rosalio Loud 10/07/2013, 10:57 AM

## 2013-10-07 NOTE — Progress Notes (Signed)
Social Work Lucy Chris, LCSW Social Worker Signed  Patient Care Conference Service date: 10/07/2013 2:52 PM  Inpatient RehabilitationTeam Conference and Plan of Care Update Date: 10/07/2013   Time: 11:30 AM     Patient Name: Tina Chambers       Medical Record Number: [id number]   Date of Birth: September 24, 1927 Sex: Female         Room/Bed: 4W08C/4W08C-01 Payor Info: Payor: MEDICARE / Plan: MEDICARE PART A AND B / Product Type: *No Product type* /   Admitting Diagnosis: rt cva   Admit Date/Time:  10/02/2013  4:51 PM Admission Comments: No comment available   Primary Diagnosis:  Acute right MCA stroke Principal Problem: Acute right MCA stroke    Patient Active Problem List     Diagnosis  Date Noted   .  Acute right MCA stroke  10/02/2013   .  Atrial fibrillation  10/02/2013   .  Essential hypertension, benign  10/02/2013   .  Other and unspecified hyperlipidemia  10/02/2013   .  Encounter for long-term (current) use of medications  10/02/2013   .  Acute ischemic stroke  09/29/2013   .  Acute respiratory failure  09/29/2013     Expected Discharge Date: Expected Discharge Date: 10/23/13  Team Members Present: Physician leading conference: Dr. Claudette Laws Social Worker Present: Dossie Der, LCSW Nurse Present: Other (comment) Kennyth Arnold lemmings-RN) PT Present: Edman Circle, PT OT Present: Rosalio Loud, OT;Kris Gellert, Heath Lark, OT SLP Present: Fae Pippin, SLP PPS Coordinator present : Tora Duck, RN, CRRN        Current Status/Progress  Goal  Weekly Team Focus   Medical     UTI, sens pnd, repeat CT normal evolution of R MCA infarct, confusion mainly related to CVA with medical overlay  maintain stability to allow full rehab participation  followup diagnostic work up for MS changes   Bowel/Bladder     incontinent bladder and bowel, In/out cath q 6hrs-no urge to void   To continent of bladder and  bowel  develop the urge to void   Swallow/Nutrition/  Hydration     NPO; Total assist with therapeutic trials  least restrictic p.o. intake with Mod assist  Therapeutic trials of nectar-thick liquids via spoon and puree   ADL's     +2 total assist with all mobility and self-care tasks  mod assist transfers, bathing, and LB dressing, min assist dressing and sitting balance  arousal, attention to task and Lt side, postural control   Mobility     +2 total A  min A basic transfers; max A standing and ambulation  Arousal, attention to L side, postural control   Communication     Mod assist  Min assist  increase use of intelligibility strategies, i.e. increase loudness   Safety/Cognition/ Behavioral Observations    Total to Max assist  Mod assist  Increase attention, awareness, orientation   Pain     c/o pain headache, prn med given  <3  assess aand monitor pain q shift   Skin     Brusing in groin and hip area, abrasion on left knee-oam applied  No skin breakdown  Monitor and assess skin q shift     *See Care Plan and progress notes for long and short-term goals.    Barriers to Discharge:  heavy assist ,NPO with Panda      Possible Resolutions to Barriers:    upgrade swallow vs PEG, cont rehab  Discharge Planning/Teaching Needs:    Family awaiting team's recommendations-probable SNF from here, will be too much care to return to ALF      Team Discussion:    Doing better today-more alert.  Treating UTI.  MBS-will try nectar with therapy only.  Alertness and issue at [facility].  Severe flexor withdrawal-MD trying zanaflex for this. Probable NHP due to amount of care pt will require at discharge.   Revisions to Treatment Plan:    Probable NHP    Continued Need for Acute Rehabilitation Level of Care: The patient requires daily medical management by a physician with specialized training in physical medicine and rehabilitation for the following conditions: Daily direction of a multidisciplinary physical rehabilitation program to ensure safe  treatment while eliciting the highest outcome that is of practical value to the patient.: Yes Daily analysis of laboratory values and/or radiology reports with any subsequent need for medication adjustment of medical intervention for : Neurological problems;Other  Lucy Chris 10/07/2013, 2:52 PM          Patient ID: Tina Chambers, female   DOB: [birth year], 79 y.o.   MRN: [id number]

## 2013-10-07 NOTE — Progress Notes (Signed)
Physical Therapy Session Note  Patient Details  Name: Tina Chambers MRN: [id number] Date of Birth: 03/04/1927  Today's Date: 10/07/2013 Time: 1005-1105 and 7829-5621 (co treat with [facility]; full time 1540-1608) Time Calculation (min): 60 min and 15 min  Short Term Goals: Week 1:  PT Short Term Goal 1 (Week 1): Pt will roll to R with side rail and max A.  PT Short Term Goal 2 (Week 1): Pt will roll to L with side rail and mod A.  PT Short Term Goal 3 (Week 1): Pt will transfer supine to edge of bed, edge of bed to supine with siderail and mod A.  PT Short Term Goal 4 (Week 1): Pt will transfer bed to chair, chair to bed to the R side with max A.  PT Short Term Goal 5 (Week 1): Pt will propel w/c 50 feet with R UE and LE, requiring min A.   Skilled Therapeutic Interventions/Progress Updates:   Therapy treatment with focus on sitting balance, trunk control, head control and sustain attention to task and initiation while seated at edge of mat with therapy ball behind pt back and mod A overall for lateral weight shifting and increased lumbar lordosis and thoracic extension during cleaning/wiping task and hair brushing at mirror and rainbow ring arc for facilitation of upright posture, head turn and visual scanning to L while moving rings with RUE from R > L with max-total verbal and tactile cues + extra time to initiate task and upright gaze to attend to midline at mirror and maintain attention to task.  Pt able to attend to task 5-7 seconds and then presented with non-purposeful perseverative movement.  Performed transfer w/c <> mat and w/c > bed with total A +2 squat pivot over the back technique to facilitate anterior and lateral leaning and mit on RUE to minimize gripping furniture; pt still does not demonstrate initiation and assistance with transfers.  Sit > supine total A +2 with LLE flexor withdrawal noted in supine.  Pt tolerated well and able to maintain arousal during full session.    PM  session:  Pt just finished hygiene with nursing and returned to w/c with tech staff.  Pt transferred to gym in w/c total A.  Placed stair rail in front of pt and assisted pt with reaching and grasp of rail with RUE and attempted sit > stand with total verbal cues and total A to initiate anterior lean with extra time to allow pt to initiate sit > stand; pt unable to initiate with UE on rail.  Transitioned to 3 musketeers with pt bilat UE around therapists' shoulders for +2 sit > stand; required +2A to maintain static standing with verbal and tactile cues to maintain upright posture.  Performed gait with +2 A x 5' with total A for lateral weight shifting to LLE, stabilization of LLE in stance to allow RLE to advance and LLE advancement and placement during swing phase.  Flexor withdrawal noted upon initial standing but ceased during gait.  Returned to sitting secondary to reports of LE pain.  Returned to room and transferred w/c > bed and sit > supine with +2 A.     Therapy Documentation Precautions:  Precautions Precautions: Fall Precaution Comments: L neglect; NG tube; maintain HOB >40 deg Restrictions Weight Bearing Restrictions: No Pain: Pain Assessment Pain Assessment: No/denies pain Faces Pain Scale: Hurts a little bit   See FIM for current functional status  Therapy/Group: Individual Therapy  Edman Circle [facility] 10/07/2013, 11:21  AM  

## 2013-10-07 NOTE — Progress Notes (Signed)
Physical Therapy Session Note  Patient Details  Name: Tina Chambers MRN: [id number] Date of Birth: 1927/11/13  Today's Date: 10/07/2013 Time: 1478-2956 Time Calculation (min): 13 min  Short Term Goals: Week 1:  PT Short Term Goal 1 (Week 1): Pt will roll to R with side rail and max A.  PT Short Term Goal 2 (Week 1): Pt will roll to L with side rail and mod A.  PT Short Term Goal 3 (Week 1): Pt will transfer supine to edge of bed, edge of bed to supine with siderail and mod A.  PT Short Term Goal 4 (Week 1): Pt will transfer bed to chair, chair to bed to the R side with max A.  PT Short Term Goal 5 (Week 1): Pt will propel w/c 50 feet with R UE and LE, requiring min A.   Skilled Therapeutic Interventions/Progress Updates:    Co-treat with primary PT. Provided assist at pt's R side (+2A with 3 musketeers type of assist) with all of the following: sit>stand from mat table, static standing, and gait x5'. During static standing, assisted in providing cueing for upright posture. During gait trial, supported pt's RUE and assisted in providing manual facilitation of weight shift to L side prior to initiation of R step. During lateral transfer from w/c>bed, provided assist in guiding posterior hips/buttocks to bed surface.  Therapy Documentation Precautions:  Precautions Precautions: Fall Precaution Comments: L neglect; NG tube; maintain HOB >40 deg Restrictions Weight Bearing Restrictions: No Vital Signs: Therapy Vitals Temp: 98.1 F (36.7 C) Temp src: Oral Pulse Rate: 74 Resp: 17 BP: 137/65 mmHg Patient Position, if appropriate: Lying Oxygen Therapy SpO2: 100 % O2 Device: None (Room air) Pain: Pain Assessment Pain Assessment: Faces Pain Score: 0-No pain Faces Pain Scale: Hurts a little bit Pain Type: Acute pain Pain Location: Back Pain Orientation: Lower Pain Descriptors / Indicators: Aching Pain Intervention(s): Repositioned Multiple Pain Sites: No Locomotion  : Ambulation Ambulation/Gait Assistance: 1: +2 Total assist   See FIM for current functional status  Therapy/Group: Individual Therapy  Calvert Cantor 10/07/2013, 5:28 PM

## 2013-10-07 NOTE — Progress Notes (Addendum)
Subjective/Complaints: 79 y.o. female with history of HTN, recent pelvic fracture, A Fib--off blood thinners due to GIB (most recent ~ 2 weeks ago), CHF who was admitted on 09/29/13 past being found with facial droop, right gaze preference and left sided weakness. CCT without evidence of bleed and patient underwent cerebral angiogram with complete revascularization of occluded R-MCA with IA tpa and thrombectomy with Solitaire device. Follow up CCT with evolving acute infarct R-MCA territory involving parietal and temporal lobe. 2D echo with EF 50-55% and no wall abnormality, grade 1 diastolic dysfunction. Carotid dopplers with 40-59% R-ICA stenosis and 1- 39% L-ICA stenosis. Extubated on 11/05 and started on D 1, nectar thick liquids due to significant pocketing with spillage as well as decrease LOC. Patient continues with left sided weakness, left inattention with right gaze preference, poor short term memory  NPO SLP trial feeds Confused this am, "at school"  Patient denies any pains patient denies any bowel or bladder issues Review of Systems - unable to do ROS secondary to mental status  Objective: Vital Signs: Blood pressure 122/59, pulse 84, temperature 97.7 F (36.5 C), temperature source Oral, resp. rate 18, height 5\' 5"  (1.651 m), weight 63.413 kg (139 lb 12.8 oz), SpO2 96.00%. Dg Chest 2 View  10/05/2013   CLINICAL DATA:  The kidney a and decreased right sided low breath sounds  EXAM: CHEST  2 VIEW  COMPARISON:  September 29, 2013.  FINDINGS: The patient has undergone interval extubation of the trachea and of the esophagus with placement of a Dobbhoff type feeding tube. The radiodense bulb lies in the region of the gastric cardia with a loop of the catheter positioned more distally. The cardiac silhouette is enlarged. The pulmonary vascularity is prominent centrally. The pulmonary interstitial markings are mildly increased today as compared to the previous study. There is no significant  pleural effusion. The observed portions of the bony thorax exhibit no acute abnormalities.  IMPRESSION: The findings suggest low-grade CHF. There is no focal pneumonia.   Electronically Signed   By: David  [place]   On: 10/05/2013 14:27   Ct Head Wo Contrast  10/05/2013   CLINICAL DATA:  86-year- female with increased weakness and fatigue. Recent right MCA infarct. Initial encounter.  EXAM: CT HEAD WITHOUT CONTRAST  TECHNIQUE: Contiguous axial images were obtained from the base of the skull through the vertex without intravenous contrast.  COMPARISON:  Brain MRI 10/01/2013 and earlier.  FINDINGS: Right side nasoenteric tube. Right side hearing aid. Visualized paranasal sinuses and mastoids are clear. *INSERT* bones No acute orbit or scalp soft tissue findings.  Hypodensity in the right MCA territory corresponding to the area of diffusion abnormality on 10/01/2013. Mild associated mass effect, primarily at the basal ganglia, stable. Mild effacement of the right lateral ventricle. Basilar cisterns remain patent. No midline shift. No acute intracranial hemorrhage identified.  Superimposed chronic right full amic lacunar infarct. Stable and normal gray-white matter differentiation in the left hemisphere and posterior fossa.  IMPRESSION: 1. Expected evolution of right MCA infarct. Stable mild mass effect and no associated hemorrhage.  2. No new intracranial abnormality identified.   Electronically Signed   By: Augusto Gamble M.D.   On: 10/05/2013 16:13   Dg Swallowing Func-speech Pathology  10/06/2013   Levora Angel, Student-SLP     10/06/2013 12:26 PM Objective Swallowing Evaluation: Modified Barium Swallowing Study   Patient Details  Name: Tina Chambers MRN: [id number] Date of Birth: Jan 11, 1927  Today's Date: 10/06/2013 Time: 0930-1000 Time Calculation (  min): 30 min  Past Medical History:  Past Medical History  Diagnosis Date  . Hypertension   . Atrial fibrillation   . CHF (congestive heart failure)   .  Hypothyroid   . Hyperlipidemia   . GIB (gastrointestinal bleeding)     recurrent  . Pelvic fracture    Past Surgical History: No past surgical history on file. HPI:  Tina Chambers is an 79 year-old female with history of HTN,  recent pelvic fracture, A Fib--off blood thinners due to GIB  (most recent ~ 2 weeks ago), CHF who was admitted on 09/29/13,  being found with facial droop, right gaze preference and left  sided weakness. CCT without evidence of bleed and patient  underwent cerebral angiogram with complete revascularization of  occluded R-MCA with IA tpa and thrombectomy with Solitaire  device. Follow up CCT with evolving acute infarct R-MCA territory  involving parietal and temporal lobe. She was extubated on 11/05  and started on Dys.1, nectar thick liquids via teaspoon due to  significant pocketing with spillage as well as decrease LOC.  Patient transferred to CIR.  Chest x-ray completed 11/10; suggest  low-grade CHF with no focal pneumonia.  Head CT without contrast  completed 11/10; expected evolution of R MCA infarct; stable mild  mass effect and no associated hemorrhage; no new intracranial  abnormality identified.  Patient made NPO due to overall lethargy  and fatigue.  Dysphagia therapy and therapeutic trials performed  at bedside; unable to rule out aspiration at bedside.  MBS  completed on 10/06/13; see report for details.     Recommendation/Prognosis  Clinical Impression Dysphagia Diagnosis: Mild oral phase dysphagia;Moderate  pharyngeal phase dysphagia Clinical impression: Patient demonstrates a moderate oral  dysphagia and a mild pharyngeal dysphagia. Impairments include:  decreased oral cohesion/containment of bolus; delay swallow  initiation; premature spillage into the vallecular space and  lateral channels; residue from the base of tongue to the lateral  channels. Patient demonstrated flash penetration with  nectar-thick liquids via spoon and cup in addition to deep flash  penetration  with thin liquids via spoon; completion of swallow  and multiple follow-up swallows cleared penetrants. Patient  demonstrated cough x1 out of multiple trials; SLP suspects  sensation impairments possibly impacted by arousal.  SLP provided  Max multi-modal cueing to elicit multiple swallows per bite/sip;  patient intermittently followed directions, but when able to they  reduced overall residue.  Patient's performance was  significantly, negatively impacted by her alertness, arousal, and  her overall severe cognitive deficits.  Patient required Max  multi-modal cueing to remain aroused during study.  Recommend  therapeutic trials of nectar-thick liquids via teaspoon and Dys.1  trials with the SLP only to ensure utilization of safe swallow  strategies and to closely monitor alertness, arousal, and  attention.  SLP recommends continuing with current NPO orders  until patient can sustain arousal and attention to safely  participate in p.o. intake.  Swallow Evaluation Recommendations Diet Recommendations: NPO Medication Administration: Via alternative means Oral Care Recommendations: Oral care Q4 per protocol Follow up Recommendations: Skilled Nursing facility;24 hour  supervision/assistance Prognosis Prognosis for Safe Diet Advancement: Fair Barriers to Reach Goals: Cognitive deficits Barriers/Prognosis Comment: Severe cognitive deficits; arousal  and alertness significantly impacts safety of p.o. trials Individuals Consulted Consulted and Agree with Results and Recommendations: Patient  unable/family or caregiver not available  SLP Assessment/Plan Short Term Goals: Week 1: SLP Short Term Goal 1 (Week 1): Patient will perform  pharyngeal strengthening exercieses with  Max multimodal cueing. SLP Short Term Goal 2 (Week 1): Patient will recall basic, daily  information with the use of an external aid with Max multimodal  cueing. SLP Short Term Goal 3 (Week 1): Patient will demonstrate basic  problem solving with Max  mulit-modal cueing. SLP Short Term Goal 4 (Week 1): Patient will utilize speech  intelligibility strategie with Mod verbal cueing. SLP Short Term Goal 5 (Week 1): Patient will label 1 physical  deficit post CVA with Max questioning and verbal cues. SLP Short Term Goal 6 (Week 1): Patient will sustain attention  for ~2 minutes with Mod multimodal cueing.  General:  Date of Onset: 09/29/13 HPI: Tina Chambers is an 79 year-old female with history of  HTN, recent pelvic fracture, A Fib--off blood thinners due to GIB  (most recent ~ 2 weeks ago), CHF who was admitted on 09/29/13,  being found with facial droop, right gaze preference and left  sided weakness. CCT without evidence of bleed and patient  underwent cerebral angiogram with complete revascularization of  occluded R-MCA with IA tpa and thrombectomy with Solitaire  device. Follow up CCT with evolving acute infarct R-MCA territory  involving parietal and temporal lobe. She was extubated on 11/05  and started on Dys.1, nectar thick liquids via teaspoon due to  significant pocketing with spillage as well as decrease LOC.  Therapies evaluations done and CIR recommended by team.  Chest  x-ray completed 11/10; suggest low-grade CHF with no focal  pneumonia.  Head CT without contrast completed 11/10; expected  evolution of R MCA infarct; stable mild mass effect and no  associated hemorrhage; no new intracranial abnormality  identified.  Dysphagia therapy and bedside trials completed with  SLP in CIR.  MBS completed on 10/06/13; see report for details. Type of Study: Modified Barium Swallowing Study Reason for Referral: Objectively evaluate swallowing function Previous Swallow Assessment: BSE 10/03/13 Diet Prior to this Study: Dysphagia 1 (puree);Nectar-thick  liquids (via teaspoon) Temperature Spikes Noted: No Respiratory Status: Room air History of Recent Intubation: Yes Length of Intubations (days): 2 days Date extubated: 09/30/13 Behavior/Cognition: Lethargic;Hard  of hearing Oral Cavity - Dentition: Adequate natural dentition (partial  dentures) Oral Motor / Sensory Function: Impaired - see Bedside swallow  eval Self-Feeding Abilities: Total assist Patient Positioning: Upright in chair Baseline Vocal Quality: Low vocal intensity;Breathy;Hoarse Volitional Cough: Weak Volitional Swallow: Able to elicit Anatomy: Within functional limits Pharyngeal Secretions: Not observed secondary MBS  Reason for Referral:  Objectively evaluate swallowing function   Oral Phase Oral Preparation/Oral Phase Oral Phase: Impaired Oral - Nectar Oral - Nectar Teaspoon: Weak lingual manipulation;Reduced  posterior propulsion;Lingual/palatal residue;Delayed oral transit Oral - Nectar Cup: Weak lingual manipulation;Reduced posterior  propulsion;Lingual/palatal residue;Delayed oral transit Oral - Thin Oral - Thin Teaspoon: Weak lingual manipulation;Lingual/palatal  residue Oral - Solids Oral - Puree: Weak lingual manipulation;Holding of  bolus;Lingual/palatal residue;Delayed oral transit Oral Phase - Comment Oral Phase - Comment: Overall moderate oral dysphagia;  significantly impacted by severe cognitive deficits  Pharyngeal Phase  Pharyngeal Phase Pharyngeal Phase: Impaired Pharyngeal - Nectar Pharyngeal - Nectar Teaspoon: Delayed swallow  initiation;Premature spillage to  valleculae;Penetration/Aspiration during swallow;Pharyngeal  residue - valleculae;Lateral channel residue;Compensatory  strategies attempted (Comment) (flash penetration; multiple  swallows) Penetration/Aspiration details (nectar teaspoon): Material enters  airway, remains ABOVE vocal cords then ejected out Pharyngeal - Nectar Cup: Delayed swallow initiation;Premature  spillage to valleculae;Lateral channel residue;Pharyngeal residue  - valleculae;Penetration/Aspiration during swallow;Compensatory  strategies attempted (Comment) (flash penetration; multiple  swallows) Penetration/Aspiration details (nectar  cup): Material enters   airway, remains ABOVE vocal cords then ejected out Pharyngeal - Thin Pharyngeal - Thin Teaspoon: Delayed swallow initiation;Premature  spillage to valleculae;Premature spillage to pyriform  sinuses;Penetration/Aspiration during swallow;Pharyngeal residue  - valleculae;Compensatory strategies attempted (Comment);Lateral  channel residue (multiple swallows) Penetration/Aspiration details (thin teaspoon): Material enters  airway, remains ABOVE vocal cords then ejected out Pharyngeal - Solids Pharyngeal - Puree: Delayed swallow initiation;Premature spillage  to valleculae;Pharyngeal residue - valleculae;Compensatory  strategies attempted (Comment) (multiple swallows) Pharyngeal Phase - Comment Pharyngeal Comment: Overall mild pharyngeal dysphagia;  significantly impacted by severe cognitive deficits  Cervical Esophageal Phase  Cervical Esophageal Phase Cervical Esophageal Phase: Maurice Small E 10/06/2013, 11:03 AM     Results for orders placed during the hospital encounter of 10/02/13 (from the past 72 hour(s))  OCCULT BLOOD X 1 CARD TO LAB, STOOL     Status: None   Collection Time    10/05/13  4:50 AM      Result Value Range   Fecal Occult Bld NEGATIVE  NEGATIVE  CBC WITH DIFFERENTIAL     Status: Abnormal   Collection Time    10/05/13  6:30 AM      Result Value Range   WBC 8.8  4.0 - 10.5 K/uL   RBC 4.16  3.87 - 5.11 MIL/uL   Hemoglobin 10.2 (*) 12.0 - 15.0 g/dL   HCT 45.4 (*) 09.8 - 11.9 %   MCV 80.5  78.0 - 100.0 fL   MCH 24.5 (*) 26.0 - 34.0 pg   MCHC 30.4  30.0 - 36.0 g/dL   RDW 14.7 (*) 82.9 - 56.2 %   Platelets 151  150 - 400 K/uL   Neutrophils Relative % 65  43 - 77 %   Neutro Abs 5.7  1.7 - 7.7 K/uL   Lymphocytes Relative 22  12 - 46 %   Lymphs Abs 2.0  0.7 - 4.0 K/uL   Monocytes Relative 12  3 - 12 %   Monocytes Absolute 1.0  0.1 - 1.0 K/uL   Eosinophils Relative 0  0 - 5 %   Eosinophils Absolute 0.0  0.0 - 0.7 K/uL   Basophils Relative 0  0 - 1 %   Basophils Absolute 0.0   0.0 - 0.1 K/uL  COMPREHENSIVE METABOLIC PANEL     Status: Abnormal   Collection Time    10/05/13  6:30 AM      Result Value Range   Sodium 137  135 - 145 mEq/L   Potassium 3.8  3.5 - 5.1 mEq/L   Chloride 101  96 - 112 mEq/L   CO2 30  19 - 32 mEq/L   Glucose, Bld 110 (*) 70 - 99 mg/dL   BUN 9  6 - 23 mg/dL   Creatinine, Ser 1.30 (*) 0.50 - 1.10 mg/dL   Calcium 9.1  8.4 - 86.5 mg/dL   Total Protein 6.1  6.0 - 8.3 g/dL   Albumin 2.7 (*) 3.5 - 5.2 g/dL   AST 24  0 - 37 U/L   ALT 15  0 - 35 U/L   Alkaline Phosphatase 81  39 - 117 U/L   Total Bilirubin 0.5  0.3 - 1.2 mg/dL   GFR calc non Af Amer 88 (*) >90 mL/min   GFR calc Af Amer >90  >90 mL/min   Comment: (NOTE)     The eGFR has been calculated using the CKD EPI equation.     This calculation has  not been validated in all clinical situations.     eGFR's persistently <90 mL/min signify possible Chronic Kidney     Disease.  OCCULT BLOOD X 1 CARD TO LAB, STOOL     Status: None   Collection Time    10/05/13  5:36 PM      Result Value Range   Fecal Occult Bld NEGATIVE  NEGATIVE  URINALYSIS, ROUTINE W REFLEX MICROSCOPIC     Status: Abnormal   Collection Time    10/05/13  5:37 PM      Result Value Range   Color, Urine YELLOW  YELLOW   APPearance CLOUDY (*) CLEAR   Specific Gravity, Urine 1.010  1.005 - 1.030   pH 6.5  5.0 - 8.0   Glucose, UA NEGATIVE  NEGATIVE mg/dL   Hgb urine dipstick TRACE (*) NEGATIVE   Bilirubin Urine NEGATIVE  NEGATIVE   Ketones, ur NEGATIVE  NEGATIVE mg/dL   Protein, ur NEGATIVE  NEGATIVE mg/dL   Urobilinogen, UA 1.0  0.0 - 1.0 mg/dL   Nitrite NEGATIVE  NEGATIVE   Leukocytes, UA TRACE (*) NEGATIVE  URINE MICROSCOPIC-ADD ON     Status: Abnormal   Collection Time    10/05/13  5:37 PM      Result Value Range   WBC, UA 7-10  <3 WBC/hpf   Bacteria, UA MANY (*) RARE   Urine-Other MUCOUS PRESENT    URINE CULTURE     Status: None   Collection Time    10/05/13  5:45 PM      Result Value Range   Specimen  Description URINE, CATHETERIZED     Special Requests NONE     Culture  Setup Time       Value: 10/06/2013 01:15     Performed at [facility]   Culture       Value: Culture reincubated for better growth     Performed at [facility]   Report Status PENDING    OCCULT BLOOD X 1 CARD TO LAB, STOOL     Status: None   Collection Time    10/06/13  5:00 AM      Result Value Range   Fecal Occult Bld NEGATIVE  NEGATIVE     HEENT: normal Cardio: RRR and no murmur Resp: decreased BS R base and mildly labored mouth breathing,  GI: BS positive and non distended Extremity:  Pulses positive and No Edema Skin:   Intact Neuro: Lethargic, Anxious and Dysarthric Musc/Skel:  Other hamstring spasm Gen NAD Flexor withdrawal spasticity left lower extremity, sustained clonus left ankle Motor strength is 0/5 left upper extremity trace hip extension left lower extremity Right upper extremity 4+/5 in the deltoid, bicep, tricep, grip right lower extremity 4/5 in the hip flexor knee extensor ankle dorsiflexor plantar flexor Sensation is absent to light touch in the left upper extremity but withdraws to pain Left lower extremity withdrawal to pain, absent light touch Right upper and right lower light touch is intact Patient appears to have a field cut on the left side but difficult to test. She does turn her head to look at objects on the left side when cued    Assessment/Plan: 1. Functional deficits secondary to R MCA infarct with L HP which require 3+ hours per day of interdisciplinary therapy in a comprehensive inpatient rehab setting. Physiatrist is providing close team supervision and 24 hour management of active medical problems listed below. Physiatrist and rehab team continue to assess barriers to discharge/monitor patient  progress toward functional and medical goals. FIM: FIM - Bathing Bathing Steps Patient Completed: Chest;Left Arm;Right upper leg Bathing: 1: Two helpers  FIM  - Upper Body Dressing/Undressing Upper body dressing/undressing: 1: Two helpers FIM - Lower Body Dressing/Undressing Lower body dressing/undressing: 1: Two helpers  FIM - Toileting Toileting: 0: Activity did not occur  FIM - Archivist Transfers: 0-Activity did not occur  FIM - Banker Devices: Sliding board Bed/Chair Transfer: 1: Supine > Sit: Total A (helper does all/Pt. < 25%);1: Bed > Chair or W/C: Total A (helper does all/Pt. < 25%)  FIM - Locomotion: Wheelchair Distance: 150 Locomotion: Wheelchair: 1: Total Assistance/staff pushes wheelchair (Pt<25%) FIM - Locomotion: Ambulation Locomotion: Ambulation Assistive Devices: Other (comment) (HHA) Ambulation/Gait Assistance: 1: +2 Total assist Locomotion: Ambulation: 0: Activity did not occur  Comprehension Comprehension Mode: Auditory Comprehension: 1-Understands basic less than 25% of the time/requires cueing 75% of the time  Expression Expression Mode: Verbal Expression: 2-Expresses basic 25 - 49% of the time/requires cueing 50 - 75% of the time. Uses single words/gestures.  Social Interaction Social Interaction Mode: Asleep Social Interaction: 2-Interacts appropriately 25 - 49% of time - Needs frequent redirection.  Problem Solving Problem Solving Mode: Asleep Problem Solving: 1-Solves basic less than 25% of the time - needs direction nearly all the time or does not effectively solve problems and may need a restraint for safety  Memory Memory Mode: Asleep Memory: 1-Recognizes or recalls less than 25% of the time/requires cueing greater than 75% of the time  Medical Problem List and Plan:  1. DVT Prophylaxis/Anticoagulation: Pharmaceutical: Lovenox  2. Pain Management: prn tylenol  3. Mood: continue Prozac for mood stabilization. Patient with cognitive deficits with poor/minimal insight---LCSW to follow for evaluation.  4. Neuropsych: This patient is not capable of  making decisions on her own behalf.  5. HTN: Monitor with bid checks and adjust medications as indicated.  6. Recent LGIB with ABLA: Monitor H/H and stool guaiacs as now on ASA .  7. A fib: Will monitor heart rate with bid checks. Continue betapace.  8. Dysphagia:Trial dysphagia 1, nectar liquids. D/C IVF  avoid overload. Push nectars to maintain hydration.  9. Hypokalemia: Dilutional--Will add supplement.  10. Spasticity: PRAFO left lower extremity.  -ROM with therapy  -Zanaflex Qhs for flexor spasms, increase to 6mg  qhs 11.  Hx of Grade 1 diastolic CHF, responded to Lasix.  Resume demadex, monitor for azotemia 12.  Mental status changes related to UTI and CHF. CT head with normal evolution of infarct. Expect improvements with treatment of UTI and CHF, also had large R MCA infarct which frequently causes disorientation, LOS (Days) 5 A FACE TO FACE EVALUATION WAS PERFORMED  KIRSTEINS,ANDREW E 10/07/2013, 7:46 AM

## 2013-10-08 ENCOUNTER — Inpatient Hospital Stay (HOSPITAL_COMMUNITY): Payer: Medicare Other | Admitting: Occupational Therapy

## 2013-10-08 ENCOUNTER — Inpatient Hospital Stay (HOSPITAL_COMMUNITY): Payer: Medicare Other

## 2013-10-08 DIAGNOSIS — G8110 Spastic hemiplegia affecting unspecified side: Secondary | ICD-10-CM

## 2013-10-08 DIAGNOSIS — I6330 Cerebral infarction due to thrombosis of unspecified cerebral artery: Secondary | ICD-10-CM

## 2013-10-08 DIAGNOSIS — I69991 Dysphagia following unspecified cerebrovascular disease: Secondary | ICD-10-CM

## 2013-10-08 LAB — URINE CULTURE: Culture: >=100000

## 2013-10-08 LAB — CBC
Hemoglobin: 10.1 g/dL — ABNORMAL LOW (ref 12.0–15.0)
MCV: 79.5 fL (ref 78.0–100.0)
Platelets: 177 10*3/uL (ref 150–400)
RBC: 4.15 MIL/uL (ref 3.87–5.11)
WBC: 9.8 10*3/uL (ref 4.0–10.5)

## 2013-10-08 MED ORDER — POTASSIUM CHLORIDE 20 MEQ/15ML (10%) PO LIQD
20.0000 meq | Freq: Two times a day (BID) | ORAL | Status: DC
  Administered 2013-10-08 – 2013-10-21 (×26): 20 meq via ORAL
  Filled 2013-10-08 (×28): qty 15

## 2013-10-08 MED ORDER — APIXABAN 2.5 MG PO TABS
2.5000 mg | ORAL_TABLET | Freq: Two times a day (BID) | ORAL | Status: DC
  Administered 2013-10-08 – 2013-10-14 (×12): 2.5 mg
  Filled 2013-10-08 (×15): qty 1

## 2013-10-08 MED ORDER — CIPROFLOXACIN HCL 250 MG PO TABS
250.0000 mg | ORAL_TABLET | Freq: Two times a day (BID) | ORAL | Status: DC
  Administered 2013-10-08 – 2013-10-09 (×2): 250 mg via ORAL
  Filled 2013-10-08 (×4): qty 1

## 2013-10-08 NOTE — Progress Notes (Signed)
Speech Language Pathology Daily Session Note  Patient Details  Name: Tina Chambers MRN: [id number] Date of Birth: [birth year]  Today's Date: 10/08/2013 Time: 1330-1400 Time Calculation (min): 30 min  Short Term Goals: Week 1: SLP Short Term Goal 1 (Week 1): Patient will perform pharyngeal strengthening exercieses with Tina multimodal cueing. SLP Short Term Goal 2 (Week 1): Patient will recall basic, daily information with the use of an external aid with Tina multimodal cueing. SLP Short Term Goal 3 (Week 1): Patient will demonstrate basic problem solving with Tina mulit-modal cueing. SLP Short Term Goal 4 (Week 1): Patient will utilize speech intelligibility strategie with Mod verbal cueing. SLP Short Term Goal 5 (Week 1): Patient will label 1 physical deficit post CVA with Tina questioning and verbal cues. SLP Short Term Goal 6 (Week 1): Patient will sustain attention for ~2 minutes with Mod multimodal cueing.  Skilled Therapeutic Interventions: Patient participated in skilled co-treatment with PT.  SLP skilled intervention focused on cognition, speech, and dysphagia goals.  SLP facilitated session with trials of Dys.1 and nectar-thick liquids via spoon; patient demonstrated intermittent wet vocal quality and required Tina verbal and tactile cues to produce a volitional cough.  Additionally, patient required Tina verbal and tactile cues to utilize safe swallow strategy of multiple swallows.  Patient required Tina multi-modal cues to scan to the left during session and to sustain attention for ~1 minute.  PT's participation and expertise positively impacted patient's attention, arousal, and alertness which in turn positively impacted outcomes of p.o. trials and cognitive tasks.  Patient's arousal and alertness is improving, however it is essential that patient is aroused during p.o. trials and recommend continued co-treatment with PT until patient can sustain arousal for longer periods of time.     Continue with current plan of care.   FIM:  Comprehension Comprehension Mode: Auditory Expression Expression Mode: Verbal Expression: 2-Expresses basic 25 - 49% of the time/requires cueing 50 - 75% of the time. Uses single words/gestures. Social Interaction Social Interaction: 2-Interacts appropriately 25 - 49% of time - Needs frequent redirection. Problem Solving Problem Solving: 1-Solves basic less than 25% of the time - needs direction nearly all the time or does not effectively solve problems and may need a restraint for safety Memory Memory: 1-Recognizes or recalls less than 25% of the time/requires cueing greater than 75% of the time FIM - Eating Eating Activity: 3: Helper brings food to mouth every scoop;1: Helper performs IV, parenteral, or tube feeding  Pain Pain Assessment Pain Assessment: No/denies pain Pain Score: 0-No pain  Therapy/Group: Individual Therapy  Levora Angel 10/08/2013, 4:02 PM

## 2013-10-08 NOTE — Progress Notes (Signed)
Physical Therapy Session Note  Patient Details  Name: Tina Chambers MRN: [id number] Date of Birth: 01-May-1927  Today's Date: 10/08/2013 Time: 1330-1400 Time Calculation (min): 30 min  Short Term Goals: Week 1:  PT Short Term Goal 1 (Week 1): Pt will roll to R with side rail and max A.  PT Short Term Goal 2 (Week 1): Pt will roll to L with side rail and mod A.  PT Short Term Goal 3 (Week 1): Pt will transfer supine to edge of bed, edge of bed to supine with siderail and mod A.  PT Short Term Goal 4 (Week 1): Pt will transfer bed to chair, chair to bed to the R side with max A.  PT Short Term Goal 5 (Week 1): Pt will propel w/c 50 feet with R UE and LE, requiring min A.   Skilled Therapeutic Interventions/Progress Updates:    Co-treat with SLP for facilitation of sitting balance and arousal for feeding trials. Pt received supine in bed. Pt transferred supine>sit +1 total A and and bed>w/c +2 total A. Pt transported to ortho gym to minimize distractions during feeding trials. Pt transferred w/c>mat +2 total A. Pt required mod-max A for sitting balance with therapist behind pt on ball to facilitate thoracic extension and upright posture. Pt required max VC to maintain attention to task. Pt demonstrated improved attention to L side visually scanning and turning head past midline several times during session. Pt maintained midline head positioning for 5 minutes. Pt demonstrated pushing to L with RUE and RUE with decreased pushing when RUE placed in lap. Pt required tactile cues and hand over hand to position RUE in lap to decrease pushing. Pt transferred mat>w/c +2 total assist. Pt left sitting in w/c at nurse's station.   Therapy Documentation Precautions:  Precautions Precautions: Fall Precaution Comments: L neglect; NG tube; maintain HOB >40 deg Restrictions Weight Bearing Restrictions: No    Pain: Pain Assessment Pain Assessment: No/denies pain Pain Score: 0-No pain      See  FIM for current functional status  Therapy/Group: Co-Treatment  Xion Debruyne 10/08/2013, 4:14 PM

## 2013-10-08 NOTE — Progress Notes (Signed)
Subjective/Complaints: 79 y.o. female with history of HTN, recent pelvic fracture, A Fib--off blood thinners due to GIB (most recent ~ 2 weeks ago), CHF who was admitted on 09/29/13 past being found with facial droop, right gaze preference and left sided weakness. CCT without evidence of bleed and patient underwent cerebral angiogram with complete revascularization of occluded R-MCA with IA tpa and thrombectomy with Solitaire device. Follow up CCT with evolving acute infarct R-MCA territory involving parietal and temporal lobe. 2D echo with EF 50-55% and no wall abnormality, grade 1 diastolic dysfunction. Carotid dopplers with 40-59% R-ICA stenosis and 1- 39% L-ICA stenosis. Extubated on 11/05 and started on D 1, nectar thick liquids due to significant pocketing with spillage as well as decrease LOC. Patient continues with left sided weakness, left inattention with right gaze preference, poor short term memory  Awake and alert, confused. Pleasant mood  Patient denies any pains patient denies any bowel or bladder issues Review of Systems - unable to do ROS secondary to mental status  Objective: Vital Signs: Blood pressure 146/84, pulse 81, temperature 98 F (36.7 C), temperature source Oral, resp. rate 18, height 5\' 5"  (1.651 m), weight 63.413 kg (139 lb 12.8 oz), SpO2 100.00%. Dg Swallowing Func-speech Pathology  10/06/2013   Levora Angel, Student-SLP     10/06/2013 12:26 PM Objective Swallowing Evaluation: Modified Barium Swallowing Study   Patient Details  Name: Tina Chambers MRN: [id number] Date of Birth: 03-28-1927  Today's Date: 10/06/2013 Time: 0930-1000 Time Calculation (min): 30 min  Past Medical History:  Past Medical History  Diagnosis Date  . Hypertension   . Atrial fibrillation   . CHF (congestive heart failure)   . Hypothyroid   . Hyperlipidemia   . GIB (gastrointestinal bleeding)     recurrent  . Pelvic fracture    Past Surgical History: No past surgical history on file. HPI:   Tina Chambers is an 79 year-old female with history of HTN,  recent pelvic fracture, A Fib--off blood thinners due to GIB  (most recent ~ 2 weeks ago), CHF who was admitted on 09/29/13,  being found with facial droop, right gaze preference and left  sided weakness. CCT without evidence of bleed and patient  underwent cerebral angiogram with complete revascularization of  occluded R-MCA with IA tpa and thrombectomy with Solitaire  device. Follow up CCT with evolving acute infarct R-MCA territory  involving parietal and temporal lobe. She was extubated on 11/05  and started on Dys.1, nectar thick liquids via teaspoon due to  significant pocketing with spillage as well as decrease LOC.  Patient transferred to CIR.  Chest x-ray completed 11/10; suggest  low-grade CHF with no focal pneumonia.  Head CT without contrast  completed 11/10; expected evolution of R MCA infarct; stable mild  mass effect and no associated hemorrhage; no new intracranial  abnormality identified.  Patient made NPO due to overall lethargy  and fatigue.  Dysphagia therapy and therapeutic trials performed  at bedside; unable to rule out aspiration at bedside.  MBS  completed on 10/06/13; see report for details.     Recommendation/Prognosis  Clinical Impression Dysphagia Diagnosis: Mild oral phase dysphagia;Moderate  pharyngeal phase dysphagia Clinical impression: Patient demonstrates a moderate oral  dysphagia and a mild pharyngeal dysphagia. Impairments include:  decreased oral cohesion/containment of bolus; delay swallow  initiation; premature spillage into the vallecular space and  lateral channels; residue from the base of tongue to the lateral  channels. Patient demonstrated flash penetration with  nectar-thick liquids  via spoon and cup in addition to deep flash  penetration with thin liquids via spoon; completion of swallow  and multiple follow-up swallows cleared penetrants. Patient  demonstrated cough x1 out of multiple trials; SLP  suspects  sensation impairments possibly impacted by arousal.  SLP provided  Max multi-modal cueing to elicit multiple swallows per bite/sip;  patient intermittently followed directions, but when able to they  reduced overall residue.  Patient's performance was  significantly, negatively impacted by her alertness, arousal, and  her overall severe cognitive deficits.  Patient required Max  multi-modal cueing to remain aroused during study.  Recommend  therapeutic trials of nectar-thick liquids via teaspoon and Dys.1  trials with the SLP only to ensure utilization of safe swallow  strategies and to closely monitor alertness, arousal, and  attention.  SLP recommends continuing with current NPO orders  until patient can sustain arousal and attention to safely  participate in p.o. intake.  Swallow Evaluation Recommendations Diet Recommendations: NPO Medication Administration: Via alternative means Oral Care Recommendations: Oral care Q4 per protocol Follow up Recommendations: Skilled Nursing facility;24 hour  supervision/assistance Prognosis Prognosis for Safe Diet Advancement: Fair Barriers to Reach Goals: Cognitive deficits Barriers/Prognosis Comment: Severe cognitive deficits; arousal  and alertness significantly impacts safety of p.o. trials Individuals Consulted Consulted and Agree with Results and Recommendations: Patient  unable/family or caregiver not available  SLP Assessment/Plan Short Term Goals: Week 1: SLP Short Term Goal 1 (Week 1): Patient will perform  pharyngeal strengthening exercieses with Max multimodal cueing. SLP Short Term Goal 2 (Week 1): Patient will recall basic, daily  information with the use of an external aid with Max multimodal  cueing. SLP Short Term Goal 3 (Week 1): Patient will demonstrate basic  problem solving with Max mulit-modal cueing. SLP Short Term Goal 4 (Week 1): Patient will utilize speech  intelligibility strategie with Mod verbal cueing. SLP Short Term Goal 5 (Week 1):  Patient will label 1 physical  deficit post CVA with Max questioning and verbal cues. SLP Short Term Goal 6 (Week 1): Patient will sustain attention  for ~2 minutes with Mod multimodal cueing.  General:  Date of Onset: 09/29/13 HPI: Tina Chambers is an 79 year-old female with history of  HTN, recent pelvic fracture, A Fib--off blood thinners due to GIB  (most recent ~ 2 weeks ago), CHF who was admitted on 09/29/13,  being found with facial droop, right gaze preference and left  sided weakness. CCT without evidence of bleed and patient  underwent cerebral angiogram with complete revascularization of  occluded R-MCA with IA tpa and thrombectomy with Solitaire  device. Follow up CCT with evolving acute infarct R-MCA territory  involving parietal and temporal lobe. She was extubated on 11/05  and started on Dys.1, nectar thick liquids via teaspoon due to  significant pocketing with spillage as well as decrease LOC.  Therapies evaluations done and CIR recommended by team.  Chest  x-ray completed 11/10; suggest low-grade CHF with no focal  pneumonia.  Head CT without contrast completed 11/10; expected  evolution of R MCA infarct; stable mild mass effect and no  associated hemorrhage; no new intracranial abnormality  identified.  Dysphagia therapy and bedside trials completed with  SLP in CIR.  MBS completed on 10/06/13; see report for details. Type of Study: Modified Barium Swallowing Study Reason for Referral: Objectively evaluate swallowing function Previous Swallow Assessment: BSE 10/03/13 Diet Prior to this Study: Dysphagia 1 (puree);Nectar-thick  liquids (via teaspoon) Temperature Spikes Noted: No Respiratory Status:  Room air History of Recent Intubation: Yes Length of Intubations (days): 2 days Date extubated: 09/30/13 Behavior/Cognition: Lethargic;Hard of hearing Oral Cavity - Dentition: Adequate natural dentition (partial  dentures) Oral Motor / Sensory Function: Impaired - see Bedside swallow  eval  Self-Feeding Abilities: Total assist Patient Positioning: Upright in chair Baseline Vocal Quality: Low vocal intensity;Breathy;Hoarse Volitional Cough: Weak Volitional Swallow: Able to elicit Anatomy: Within functional limits Pharyngeal Secretions: Not observed secondary MBS  Reason for Referral:  Objectively evaluate swallowing function   Oral Phase Oral Preparation/Oral Phase Oral Phase: Impaired Oral - Nectar Oral - Nectar Teaspoon: Weak lingual manipulation;Reduced  posterior propulsion;Lingual/palatal residue;Delayed oral transit Oral - Nectar Cup: Weak lingual manipulation;Reduced posterior  propulsion;Lingual/palatal residue;Delayed oral transit Oral - Thin Oral - Thin Teaspoon: Weak lingual manipulation;Lingual/palatal  residue Oral - Solids Oral - Puree: Weak lingual manipulation;Holding of  bolus;Lingual/palatal residue;Delayed oral transit Oral Phase - Comment Oral Phase - Comment: Overall moderate oral dysphagia;  significantly impacted by severe cognitive deficits  Pharyngeal Phase  Pharyngeal Phase Pharyngeal Phase: Impaired Pharyngeal - Nectar Pharyngeal - Nectar Teaspoon: Delayed swallow  initiation;Premature spillage to  valleculae;Penetration/Aspiration during swallow;Pharyngeal  residue - valleculae;Lateral channel residue;Compensatory  strategies attempted (Comment) (flash penetration; multiple  swallows) Penetration/Aspiration details (nectar teaspoon): Material enters  airway, remains ABOVE vocal cords then ejected out Pharyngeal - Nectar Cup: Delayed swallow initiation;Premature  spillage to valleculae;Lateral channel residue;Pharyngeal residue  - valleculae;Penetration/Aspiration during swallow;Compensatory  strategies attempted (Comment) (flash penetration; multiple  swallows) Penetration/Aspiration details (nectar cup): Material enters  airway, remains ABOVE vocal cords then ejected out Pharyngeal - Thin Pharyngeal - Thin Teaspoon: Delayed swallow initiation;Premature  spillage to  valleculae;Premature spillage to pyriform  sinuses;Penetration/Aspiration during swallow;Pharyngeal residue  - valleculae;Compensatory strategies attempted (Comment);Lateral  channel residue (multiple swallows) Penetration/Aspiration details (thin teaspoon): Material enters  airway, remains ABOVE vocal cords then ejected out Pharyngeal - Solids Pharyngeal - Puree: Delayed swallow initiation;Premature spillage  to valleculae;Pharyngeal residue - valleculae;Compensatory  strategies attempted (Comment) (multiple swallows) Pharyngeal Phase - Comment Pharyngeal Comment: Overall mild pharyngeal dysphagia;  significantly impacted by severe cognitive deficits  Cervical Esophageal Phase  Cervical Esophageal Phase Cervical Esophageal Phase: Doren Custard 10/06/2013, 11:03 AM     Results for orders placed during the hospital encounter of 10/02/13 (from the past 72 hour(s))  OCCULT BLOOD X 1 CARD TO LAB, STOOL     Status: None   Collection Time    10/05/13  5:36 PM      Result Value Range   Fecal Occult Bld NEGATIVE  NEGATIVE  URINALYSIS, ROUTINE W REFLEX MICROSCOPIC     Status: Abnormal   Collection Time    10/05/13  5:37 PM      Result Value Range   Color, Urine YELLOW  YELLOW   APPearance CLOUDY (*) CLEAR   Specific Gravity, Urine 1.010  1.005 - 1.030   pH 6.5  5.0 - 8.0   Glucose, UA NEGATIVE  NEGATIVE mg/dL   Hgb urine dipstick TRACE (*) NEGATIVE   Bilirubin Urine NEGATIVE  NEGATIVE   Ketones, ur NEGATIVE  NEGATIVE mg/dL   Protein, ur NEGATIVE  NEGATIVE mg/dL   Urobilinogen, UA 1.0  0.0 - 1.0 mg/dL   Nitrite NEGATIVE  NEGATIVE   Leukocytes, UA TRACE (*) NEGATIVE  URINE MICROSCOPIC-ADD ON     Status: Abnormal   Collection Time    10/05/13  5:37 PM      Result Value Range   WBC, UA 7-10  <3 WBC/hpf  Bacteria, UA MANY (*) RARE   Urine-Other MUCOUS PRESENT    URINE CULTURE     Status: None   Collection Time    10/05/13  5:45 PM      Result Value Range   Specimen Description URINE,  CATHETERIZED     Special Requests NONE     Culture  Setup Time       Value: 10/06/2013 01:15     Performed at [facility]   Culture       Value: >=100,000 COLONIES/mL GRAM NEGATIVE RODS     Performed at [facility]   Report Status PENDING    OCCULT BLOOD X 1 CARD TO LAB, STOOL     Status: None   Collection Time    10/06/13  5:00 AM      Result Value Range   Fecal Occult Bld NEGATIVE  NEGATIVE  BASIC METABOLIC PANEL     Status: Abnormal   Collection Time    10/07/13  6:30 AM      Result Value Range   Sodium 138  135 - 145 mEq/L   Potassium 3.4 (*) 3.5 - 5.1 mEq/L   Chloride 97  96 - 112 mEq/L   CO2 31  19 - 32 mEq/L   Glucose, Bld 141 (*) 70 - 99 mg/dL   BUN 23  6 - 23 mg/dL   Creatinine, Ser 1.61  0.50 - 1.10 mg/dL   Calcium 9.0  8.4 - 09.6 mg/dL   GFR calc non Af Amer 82 (*) >90 mL/min   GFR calc Af Amer >90  >90 mL/min   Comment: (NOTE)     The eGFR has been calculated using the CKD EPI equation.     This calculation has not been validated in all clinical situations.     eGFR's persistently <90 mL/min signify possible Chronic Kidney     Disease.  OCCULT BLOOD X 1 CARD TO LAB, STOOL     Status: None   Collection Time    10/07/13  3:28 PM      Result Value Range   Fecal Occult Bld NEGATIVE  NEGATIVE  CBC     Status: Abnormal   Collection Time    10/08/13  5:18 AM      Result Value Range   WBC 9.8  4.0 - 10.5 K/uL   RBC 4.15  3.87 - 5.11 MIL/uL   Hemoglobin 10.1 (*) 12.0 - 15.0 g/dL   HCT 04.5 (*) 40.9 - 81.1 %   MCV 79.5  78.0 - 100.0 fL   MCH 24.3 (*) 26.0 - 34.0 pg   MCHC 30.6  30.0 - 36.0 g/dL   RDW 91.4 (*) 78.2 - 95.6 %   Platelets 177  150 - 400 K/uL     HEENT: normal Cardio: RRR and no murmur Resp: decreased BS R base and mildly labored mouth breathing,  GI: BS positive and non distended Extremity:  Pulses positive and No Edema Skin:   Intact Neuro: Lethargic, Anxious and Dysarthric Musc/Skel:  Other hamstring spasm Gen NAD Flexor  withdrawal spasticity left lower extremity, sustained clonus left ankle Motor strength is 0/5 left upper extremity trace hip extension left lower extremity Right upper extremity 4+/5 in the deltoid, bicep, tricep, grip right lower extremity 4/5 in the hip flexor knee extensor ankle dorsiflexor plantar flexor Sensation is absent to light touch in the left upper extremity but withdraws to pain Left lower extremity withdrawal to pain, absent light touch Right  upper and right lower light touch is intact Patient with likely field cut on the left side.  She follows simple commands but is internally distracted  Assessment/Plan: 1. Functional deficits secondary to R MCA infarct with L HP which require 3+ hours per day of interdisciplinary therapy in a comprehensive inpatient rehab setting. Physiatrist is providing close team supervision and 24 hour management of active medical problems listed below. Physiatrist and rehab team continue to assess barriers to discharge/monitor patient progress toward functional and medical goals. FIM: FIM - Bathing Bathing Steps Patient Completed: Chest;Left Arm;Right upper leg Bathing: 1: Two helpers  FIM - Upper Body Dressing/Undressing Upper body dressing/undressing: 1: Two helpers FIM - Lower Body Dressing/Undressing Lower body dressing/undressing: 1: Two helpers  FIM - Toileting Toileting: 0: Activity did not occur  FIM - Archivist Transfers: 0-Activity did not occur  FIM - Banker Devices: HOB elevated;Bed rails Bed/Chair Transfer: 1: Two helpers;1: Supine > Sit: Total A (helper does all/Pt. < 25%);1: Bed > Chair or W/C: Total A (helper does all/Pt. < 25%) (+2 supine>sit; totalA x1 bed>chair)  FIM - Locomotion: Wheelchair Distance: 150 Locomotion: Wheelchair: 0: Activity did not occur FIM - Locomotion: Ambulation Locomotion: Ambulation Assistive Devices: Other (comment) (+2 A (3  musketeers)) Ambulation/Gait Assistance: 1: +2 Total assist Locomotion: Ambulation: 1: Two helpers  Comprehension Comprehension Mode: Asleep Comprehension: 2-Understands basic 25 - 49% of the time/requires cueing 51 - 75% of the time  Expression Expression Mode: Asleep Expression: 2-Expresses basic 25 - 49% of the time/requires cueing 50 - 75% of the time. Uses single words/gestures.  Social Interaction Social Interaction Mode: Asleep Social Interaction: 2-Interacts appropriately 25 - 49% of time - Needs frequent redirection.  Problem Solving Problem Solving Mode: Asleep Problem Solving: 1-Solves basic less than 25% of the time - needs direction nearly all the time or does not effectively solve problems and may need a restraint for safety  Memory Memory Mode: Asleep Memory: 1-Recognizes or recalls less than 25% of the time/requires cueing greater than 75% of the time  Medical Problem List and Plan:  1. DVT Prophylaxis/Anticoagulation: Pharmaceutical: Lovenox  2. Pain Management: prn tylenol  3. Mood: continue Prozac for mood stabilization. Patient with cognitive deficits with poor/minimal insight---LCSW to follow for evaluation.  4. Neuropsych: This patient is not capable of making decisions on her own behalf.  5. HTN: Monitor with bid checks and adjust medications as indicated.  6. Recent LGIB with ABLA: Monitor H/H and stool guaiacs as now on ASA .  7. A fib: Will monitor heart rate with bid checks. Continue betapace.  8. Dysphagia:NPO, trials of D1 with SLP 9. Hypokalemia: Dilutional--added supplement.  10. Spasticity: PRAFO left lower extremity.  -ROM with therapy  -Zanaflex Qhs for flexor spasms, increase to 6mg  qhs 11.  Hx of Grade 1 diastolic CHF, responded to Lasix.  Resume demadex, monitor for azotemia 12.  Mental status changes related to UTI and CHF. CT head with normal evolution of infarct.  -rx UTI and medical issues. Limit neurosedating meds LOS (Days) 6 A FACE  TO FACE EVALUATION WAS PERFORMED  Tomi Paddock T 10/08/2013, 8:41 AM

## 2013-10-08 NOTE — Progress Notes (Signed)
Called neurology about family's concerns regarding recurrent stroke and resuming blood thinners. Dr. Pearlean Brownie has reviewed films and as patient is safe to start blood thinners.  Eliquis initiated per recommendations.

## 2013-10-08 NOTE — Progress Notes (Signed)
I have reviewed and agree with attached treatment note.  Edson Snowball, PT

## 2013-10-08 NOTE — Progress Notes (Signed)
Physical Therapy Session Note  Patient Details  Name: Tina Chambers MRN: [id number] Date of Birth: 23-Nov-1927  Today's Date: 10/08/2013 Time: 7829-5621 Time Calculation (min): 61 min  Short Term Goals: Week 1:  PT Short Term Goal 1 (Week 1): Pt will roll to R with side rail and max A.  PT Short Term Goal 2 (Week 1): Pt will roll to L with side rail and mod A.  PT Short Term Goal 3 (Week 1): Pt will transfer supine to edge of bed, edge of bed to supine with siderail and mod A.  PT Short Term Goal 4 (Week 1): Pt will transfer bed to chair, chair to bed to the R side with max A.  PT Short Term Goal 5 (Week 1): Pt will propel w/c 50 feet with R UE and LE, requiring min A.   Skilled Therapeutic Interventions/Progress Updates:    Pt received seated in w/c at nurses station. Pt alert upon arrival. Pt transported to therapy gym and transferred w/c>mat +2 total A. Performed seated activities to encourage attention to task, visual scanning to L and reach to/across midline. Pt required max A for sitting balance with therapist behind pt on ball to facilitate upright posture and thoracic extension. Pt able to sustain attention 5-10 seconds on placing colored pegs into hole with max verbal cues and then demonstrated perseverative movement with peg. Pt able to correctly identify color of peg when asked 5/5 trials but unable to select specific color from group of pegs when asked. Pt demonstrated improvement in attention to L side when moving rings on ring arc with RUE R>L and was able to bring rings slightly past midline with max VC before returning gaze to R. Pt performed sit>supine on mat +2 total A and supine> L sidelying max A. Performed reaching and placing rings on peg in L side lying to encourage L head turn and reaching across midline. Pt required max verbal cues and tactile cues to attend to task and initiate reaching. Pt performed supine>sit +2 total A. Pt performed sit>stand 2+ total A 3  musketeers style and maintained standing ~45 seconds with max verbal cues, tactile cues, and facilitation for upright posture, L hip and knee extension. Pt unable to achieve upright posture and maintained hip and trunk flexion despite max cueing. Pt performed stand>sit +2 total A. Pt transferred mat>w/c +2 total A. Pt maintained arousal throughout entire session. Pt left seated in w/c at nurse's station.    Therapy Documentation Precautions:  Precautions Precautions: Fall Precaution Comments: L neglect; NG tube; maintain HOB >40 deg Restrictions Weight Bearing Restrictions: No Pain: Pain Assessment Pain Assessment: Faces Faces Pain Scale: Hurts a little bit Pain Location: Shoulder Pain Orientation: Right Pain Intervention(s): Repositioned  See FIM for current functional status  Therapy/Group: Individual Therapy  Alicianna Litchford 10/08/2013, 12:42 PM

## 2013-10-08 NOTE — Progress Notes (Signed)
Occupational Therapy Session Note  Patient Details  Name: Tina Chambers MRN: [id number] Date of Birth: Mar 12, 1927  Today's Date: 10/08/2013 Time: 7829-5621 and 3086-5784 Time Calculation (min): 60 min and 26 min  Short Term Goals: Week 1:  OT Short Term Goal 1 (Week 1): Patient will perform bed mobility of roling left <>right with maximal assistance in order to assist caregiver in BADL OT Short Term Goal 2 (Week 1): Patient will maintain static sitting balance (edge of bed) for at least 5 minutes with maximal assistance in order to work towards edge of bed level BADLs OT Short Term Goal 3 (Week 1): Patient will complete grooming tasks at sink with moderate assistance OT Short Term Goal 4 (Week 1): Patient will complete self-feeding task with minimal assistance and moderate verbal cues to stay on task  Skilled Therapeutic Interventions/Progress Updates:    1) Pt seen for ADL retraining with focus on bed mobility, static sitting balance, midline orientation, scanning to Lt body, and transfers.  Pt in bed upon arrival, requiring passive stretching to LLE to attempt to decrease flexor withdrawal.  Engaged in bathing and dressing seated at EOB with +2 to maintain static sitting balance du to extreme pushing to Lt in sitting.  Encouraged pt to reach forward towards therapist's hand to attempt to break up tone and decrease pushing.  Pt with 1 instance of maintaining static sitting balance for approx 30 seconds with close supervision.  Pt with decreased fixation on items to Rt and was easier to redirect this session, however continues to require max verbal cues, wait time, and hand over hand assist to follow one step directions.  With increased time and tactile cues, pt able to initiate washing LUE.  Pt able to thread Rt shirt sleeve with assistance this session and hand over hand to pull shirt over head.  Sit to stand with +2 to pull pants over hips while therapist provided manual facilitation to  weight shift forward and maintain midline standing while second person pulled pants over hips.  Slide board transfer to w/c to Rt with +2 for safety with pt with decreased pushing this session, as therapist positioned Rt hand in non-weight bearing position to eliminate pushing.  Upon transfer to w/c, pt reports need to toilet.  Performed slide board transfer total assist +2 with pt not assisting with transfer and 2nd person pulling pants over hips.  Pt with no void or BM, returned to w/c with total assist of 2.  Pt placed at RN station in w/c awaiting arrival of PT.  2) Pt seen for 1:1 OT with focus on midline orientation, scanning to Lt, and following two-step commands.  Pt seated upright in bed upon arrival.  Engaged in scanning task with matching colored cubes to colors on paper.  Placed paper at midline and asked pt to name colors, followed by asking pt to scan to Lt to obtain cubes and then place cube on top of colored spot on paper.  Pt initially distracted by compartment on Rt side of table, but able to be redirected to picture at midline.  Able to follow two step command when only locating 2-4 colors, once paper with 6+ colors pt became increasingly distracted and unable to continue on with task, even with mod-max verbal cues. Returned to 2 color page with pt able to complete task prior to passing pt off to PT and SLP.  Therapy Documentation Precautions:  Precautions Precautions: Fall Precaution Comments: L neglect; NG tube; maintain HOB >40  deg Restrictions Weight Bearing Restrictions: No Pain:  Pt with no c/o pain   See FIM for current functional status  Therapy/Group: Individual Therapy  Rosalio Loud 10/08/2013, 11:38 AM

## 2013-10-08 NOTE — Progress Notes (Signed)
I have reviewed and agree with attached treatment note.  Becky Rosalin Buster, PT 

## 2013-10-09 ENCOUNTER — Inpatient Hospital Stay (HOSPITAL_COMMUNITY): Payer: Medicare Other | Admitting: Physical Therapy

## 2013-10-09 ENCOUNTER — Inpatient Hospital Stay (HOSPITAL_COMMUNITY): Payer: Medicare Other

## 2013-10-09 ENCOUNTER — Inpatient Hospital Stay (HOSPITAL_COMMUNITY): Payer: Medicare Other | Admitting: *Deleted

## 2013-10-09 ENCOUNTER — Inpatient Hospital Stay (HOSPITAL_COMMUNITY): Payer: Medicare Other | Admitting: Occupational Therapy

## 2013-10-09 MED ORDER — LEVOFLOXACIN 250 MG PO TABS
250.0000 mg | ORAL_TABLET | Freq: Every day | ORAL | Status: AC
  Administered 2013-10-09 – 2013-10-14 (×6): 250 mg via ORAL
  Filled 2013-10-09 (×6): qty 1

## 2013-10-09 NOTE — Plan of Care (Signed)
Problem: RH BLADDER ELIMINATION Goal: RH STG MANAGE BLADDER WITH ASSISTANCE STG Manage Bladder With mod Assistance  Outcome: Not Progressing Continues to require I/O caths due to inability to void

## 2013-10-09 NOTE — Progress Notes (Signed)
NUTRITION FOLLOW-UP  DOCUMENTATION CODES Per approved criteria  -Not Applicable   INTERVENTION: Continue Jevity 1.2 at 70 ml/hr x 20 hours. 200 ml H2O flush every 6 hours. TF regimen provides: 1680 kcal, 77 grams protein, 1930 ml H2O. Diet texture and liquid consistency per SLP. RD to continue to follow nutrition care plan.  NUTRITION DIAGNOSIS: Inadequate oral intake related to dysphagia as evidenced by meal completion <25%. Ongoing.  Goal: Pt to meet >/= 90% of their estimated nutrition needs - met.  Monitor:  Diet advancement, TF tolerance, weights, labs  ASSESSMENT: Pt admitted to [facility] 11/4 with R MCA, IV tPA given and thrombectomy. Pt extubated 11/5. Pt started on Dysphagia and has significant pocketing and spillage. Pt transferred to CIR on 11/7.   MBSS on 11/11 recommended NPO status. Anticipated d/c date is 11/28.  Pt remains confused. Underwent trials of Dysphagia 1 diet with Nectar-Thickened Liquids via spoon with SLP on 11/13.  Pt is currently ordered for Jevity 1.2 at 70 ml/hr x 20 hours daily. This provides: 1680 kcal, 77 grams protein, 1930 ml H2O. Free water flushes of 200 ml QID ordered, this provides 800 ml additional free water daily. Weight remains stable. RN confirms that pt is tolerating this regimen well at this time.  Potassium is low at 3.4.   Height: Ht Readings from Last 1 Encounters:  10/02/13 5\' 5"  (1.651 m)    Weight: Wt Readings from Last 1 Encounters:  10/09/13 142 lb 14.4 oz (64.819 kg)  Admit wt 144 lb - stable  BMI:  Body mass index is 23.78 kg/(m^2). Weight is WNL.  Estimated Nutritional Needs: Kcal: 1500-1700 Protein: 75-85 grams Fluid: > 1.5 L/day  Skin: sacrum wound  Diet Order: NPO   EDUCATION NEEDS: -No education needs identified at this time   Intake/Output Summary (Last 24 hours) at 10/09/13 1202 Last data filed at 10/09/13 1201  Gross per 24 hour  Intake      0 ml  Output   1625 ml  Net  -1625 ml    Last BM:  11/13   Labs:   Recent Labs Lab 10/05/13 0630 10/07/13 0630  NA 137 138  K 3.8 3.4*  CL 101 97  CO2 30 31  BUN 9 23  CREATININE 0.47* 0.59  CALCIUM 9.1 9.0  GLUCOSE 110* 141*    CBG (last 3)  No results found for this basename: GLUCAP,  in the last 72 hours  Scheduled Meds: . amLODipine  5 mg Oral Daily  . antiseptic oral rinse  15 mL Mouth Rinse BID  . apixaban  2.5 mg Per Tube BID  . atorvastatin  10 mg Oral q1800  . FLUoxetine  10 mg Oral Daily  . free water  200 mL Per Tube TID WC & HS  . levofloxacin  250 mg Oral Daily  . levothyroxine  75 mcg Oral QAC breakfast  . pantoprazole sodium  40 mg Per Tube Daily  . potassium chloride  20 mEq Oral BID  . sotalol  80 mg Oral Q12H  . tiZANidine  6 mg Oral QHS  . torsemide  5 mg Oral Daily    Continuous Infusions: . feeding supplement (JEVITY 1.2 CAL) 1,000 mL (10/07/13 2158)    Jarold Motto MS, RD, LDN Pager: [phone] After-hours pager: [phone]

## 2013-10-09 NOTE — Progress Notes (Addendum)
Occupational Therapy Note Patient Details  Name: FATIM VANDERSCHAAF MRN: [id number] Date of Birth: 04-03-1927 Today's Date: 10/09/2013  Time:1300-1330  ( ) Pain: Left hip with movement  (6/10) Individual session   Pt. Lying in bed lying on left side.   Daughter, Stanton Kidney present.  Performed bed mobility, PROM, NMRE, sitting balance.  LLE positioned in external rotation and knee flexion.  Did PROM and stretching to LLE.  Pt had pain in left hip with movements.  Rolled to right and left to don pants.  Rolled to right and went from sidelying to sit with total assist.  Sat EOB for 10 minutes with max assist for balance.  Pt. Sat for 1 minute with minimal assist during that period.  Returned back to bed with total assist.  Left with next therapist.      Humberto Seals 10/09/2013, 1:44 PM

## 2013-10-09 NOTE — Progress Notes (Deleted)
Occupational Therapy Note Patient Details  Name: GITA DILGER MRN: [id number] Date of Birth: 15-Feb-1927 Today's Date: 10/09/2013  Time:  1300-1330  (30 min0 Pain: L hip pain with movement Individual session  Term Goals:  Week 1: OT Short Term Goal 1 (Week 1): Patient will perform bed mobility of roling left <>right with maximal assistance in order to assist caregiver in BADL  OT Short Term Goal 2 (Week 1): Patient will maintain static sitting balance (edge of bed) for at least 5 minutes with maximal assistance in order to work towards edge of bed level BADLs  OT Short Term Goal 3 (Week 1): Patient will complete grooming tasks at sink with moderate assistance  OT Short Term Goal 4 (Week 1): Patient will complete self-feeding task with minimal assistance and moderate verbal cues to stay on task Focus of treatment was bed mobility, sitting balance, attention, therapeutic activities, sustained attention,postural control..    Pt. Lying in bed on left side upon arrival.  LLE with knee flexion, external rotation.  Performed LL  stretching and PROM.  Pt. Had pain with mobility in low back and left hip.    Humberto Seals 10/09/2013, 12:48 PM

## 2013-10-09 NOTE — Progress Notes (Signed)
Physical Therapy Session Note  Patient Details  Name: Tina Chambers MRN: [id number] Date of Birth: [birth year]  Today's Date: 10/09/2013 Time: 4098-1191 and 1400-1430 (full time 1330-1430 co treat with SLP) Time Calculation (min): 67 min and 30 min  Short Term Goals: Week 1:  PT Short Term Goal 1 (Week 1): Pt will roll to R with side rail and max A.  PT Short Term Goal 2 (Week 1): Pt will roll to L with side rail and mod A.  PT Short Term Goal 3 (Week 1): Pt will transfer supine to edge of bed, edge of bed to supine with siderail and mod A.  PT Short Term Goal 4 (Week 1): Pt will transfer bed to chair, chair to bed to the R side with max A.  PT Short Term Goal 5 (Week 1): Pt will propel w/c 50 feet with R UE and LE, requiring min A.   Skilled Therapeutic Interventions/Progress Updates:   Pt resting in tilt in space w/c at nurses station; pt continue to be more alert, responsive and answering questions more appropriately. Transitioned to gym in w/c total A.  After w/c set up beside mat, attempted to cue pt to initiate lateral scooting to L from w/c > mat with visual cues and one step commands with extra time.  Pt able to to initiate anterior lean from back of w/c but unable to initiate actual weight shift or lateral scooting.  Performed squat pivot to mat with total A over the back technique and sit > supine with +2 A on flat mat.  Performed NMR in supine; see below for details.  Returned to sitting max-total A with pt initiating RLE to EOB and lifting assistance and tactile cues at R pelvis for depression to bring trunk upright EOB.  Performed mat > w/c > bed squat pivot and sit > supine in bed total A +2 to rest before PM session.  PM session: therapy co-treat with SLP with focus on sustained attention, initiation of swallowing teaspoonfuls of thickened liquid.  Performed transfers mat <> w/c squat pivot total A +2; During swallowing trials PT provided support at trunk and pelvis from  behind with therapy ball and with LUE supported on 3 pillows into ABD to facilitate upright head and trunk position through thoracic extension and lumbar lordosis.  As pt fatigued with swallowing and presented with increased difficulty initiating swallow pt engaged in reaching and cone stacking task on floor in front of pt to facilitate active initiation of anterior lean to L, midline and to the R x 2 sets of stacking 6-8 cones.  Pt initially required hand over hand for anterior lean and stacking but progressed to initiating 50% of the time.  Returned to w/c and to family room with daughter.     Therapy Documentation Precautions:  Precautions Precautions: Fall Precaution Comments: L neglect; NG tube; maintain HOB >40 deg Restrictions Weight Bearing Restrictions: No Vital Signs: Therapy Vitals BP: 113/66 mmHg Pain: Pain Assessment Pain Assessment: Faces Pain Score: 5  Faces Pain Scale: Hurts a little bit Pain Type: Acute pain Pain Location: Hip Pain Orientation: Right Pain Descriptors / Indicators: Guarding Pain Onset: With Activity Patients Stated Pain Goal: 2 Pain Intervention(s): Repositioned Other Treatments: Treatments Neuromuscular Facilitation: Right;Left;Lower Extremity;Activity to increase coordination;Activity to increase motor control;Activity to increase timing and sequencing;Activity to increase grading in supine with focus on minimizing over-activation of RLE during active hip ABD with extra time to process and initiate AAROM.  Transitioned to facilitation  of rolling to L and R with focus on active trunk flexion and rotation across midline to L and R while reaching with RUE for rings in midline and then to L with total A and total verbal, tactile and verbal cues for RUE to assist LUE with reaching to R side to target, maintain sustained and selective attention to task and for initiation of reaching.  Pt able to successfully roll to R side once and to L side 3-4 times with min A  at lower trunk to initiate LE rotation.   See FIM for current functional status  Therapy/Group: Individual Therapy  Edman Circle [facility] 10/09/2013, 12:21 PM

## 2013-10-09 NOTE — Progress Notes (Signed)
Occupational Therapy Weekly Progress Note  Patient Details  Name: Tina Chambers MRN: [id number] Date of Birth: 1927-11-07  Today's Date: 10/09/2013 Time: 4098-1191 Time Calculation (min): 63 min  Patient has met 2 of 4 short term goals.  Pt is making slow progress towards goals.  Pt's progress was complicated due to UTI which was impairing arousal and attention to task, in past 1-2 days pt has begun to clear, however remains pleasantly confused.  Unable to address attention and participation during self-feeding secondary to pt remaining NPO due to decreased arousal.  Pt currently requires +2 assistance with all mobility from bed level to transfers to sit > stand due to active pushing to Lt with all mobility even static sitting.  Pt continues to require +2 assist for bed mobility especially with rolling to Rt.  Utilizing slide board for transfers to increase safety due to pushing, however pt continues to require +2 for safety and physical assist when pt with decreased arousal/alertness.  Pt continues to require max verbal cues for attention to task and sequencing with self-care tasks.  Pt will require increased amount of cognitive and physical assistance upon d/c.  Patient continues to demonstrate the following deficits: Lt hemiparesis, flaccid LUE, impaired attention, arousal, sequencing, and initiation, Lt inattention, decreased sitting balance, pushing and therefore will continue to benefit from skilled OT intervention to enhance overall performance with BADL and Reduce care partner burden.  Patient progressing toward long term goals..  Continue plan of care.  OT Short Term Goals Week 1:  OT Short Term Goal 1 (Week 1): Patient will perform bed mobility of roling left <>right with maximal assistance in order to assist caregiver in BADL OT Short Term Goal 1 - Progress (Week 1): Progressing toward goal OT Short Term Goal 2 (Week 1): Patient will maintain static sitting balance (edge of bed)  for at least 5 minutes with maximal assistance in order to work towards edge of bed level BADLs OT Short Term Goal 2 - Progress (Week 1): Met OT Short Term Goal 3 (Week 1): Patient will complete grooming tasks at sink with moderate assistance OT Short Term Goal 3 - Progress (Week 1): Met OT Short Term Goal 4 (Week 1): Patient will complete self-feeding task with minimal assistance and moderate verbal cues to stay on task OT Short Term Goal 4 - Progress (Week 1): Not met Week 2:  OT Short Term Goal 1 (Week 2): Patient will perform bed mobility of roling left <>right with maximal assistance in order to assist caregiver in BADL OT Short Term Goal 2 (Week 2): Pt will scan to Lt environment with min verbal cues to obtain necessary items to complete self-care tasks OT Short Term Goal 3 (Week 2): Pt will complete BSC transfer with max assist of 1 caregiver with use of slide board PRN OT Short Term Goal 4 (Week 2): Pt will complete UB dressing with mod assist OT Short Term Goal 5 (Week 2): Pt will complete bathing with max assist of 1 caregiver  Skilled Therapeutic Interventions/Progress Updates:    Pt seen for ADL retraining with focus on bed mobility, static sitting balance, midline orientation, scanning to Lt, and transfers.  Pt in bed upon arrival and incontinent of stool.  Engaged in rolling Rt and Lt in bed to complete hygiene with pt requiring +2 assist for initiation, sequencing, and manual facilitation of weight shifting with rolling.  Pt resistant to let go of bed rail on Rt with Rt hand, requiring manual hand  over hand assist to release and reach to Lt to assist in rolling and assisting with trunk while second person assisted in positioning BLE to aid in rolling.  Supine to sit total assist of 1 caregiver this session with max verbal cues for sequencing and hand over hand assist to reposition RUE.  Pt demonstrates pushing to Lt with RUE in sitting and standing, note decreased pushing when RUE placed  in lap. Pt required tactile cues and hand over hand to position RUE in lap to decrease pushing.  Slide board transfer bed to w/c with total assist and 2nd person for safety, again repositioned RUE in lap to decrease pushing.  Engaged in UB bathing and dressing at sink and grooming tasks with pt with increased attention to task and ability to follow one step directions to sequence UB bathing with min cues to wash Lt arm.  Pt continues to require hand over hand assist with sequencing of donning shirt.   Therapy Documentation Precautions:  Precautions Precautions: Fall Precaution Comments: L neglect; NG tube; maintain HOB >40 deg Restrictions Weight Bearing Restrictions: No Pain:  Pt with no c/o pain  See FIM for current functional status  Therapy/Group: Individual Therapy  Rosalio Loud 10/09/2013, 8:45 AM

## 2013-10-09 NOTE — Progress Notes (Signed)
Subjective/Complaints: 79 y.o. female with history of HTN, recent pelvic fracture, A Fib--off blood thinners due to GIB (most recent ~ 2 weeks ago), CHF who was admitted on 09/29/13 past being found with facial droop, right gaze preference and left sided weakness. CCT without evidence of bleed and patient underwent cerebral angiogram with complete revascularization of occluded R-MCA with IA tpa and thrombectomy with Solitaire device. Follow up CCT with evolving acute infarct R-MCA territory involving parietal and temporal lobe. 2D echo with EF 50-55% and no wall abnormality, grade 1 diastolic dysfunction. Carotid dopplers with 40-59% R-ICA stenosis and 1- 39% L-ICA stenosis. Extubated on 11/05 and started on D 1, nectar thick liquids due to significant pocketing with spillage as well as decrease LOC. Patient continues with left sided weakness, left inattention with right gaze preference, poor short term memory  Awake and alert. Appears comfortable. Remains confused.   Patient denies any pains patient denies any bowel or bladder issues Review of Systems - unable to do ROS secondary to mental status  Objective: Vital Signs: Blood pressure 138/53, pulse 72, temperature 98.2 F (36.8 C), temperature source Oral, resp. rate 18, height 5\' 5"  (1.651 m), weight 64.819 kg (142 lb 14.4 oz), SpO2 100.00%. No results found. Results for orders placed during the hospital encounter of 10/02/13 (from the past 72 hour(s))  BASIC METABOLIC PANEL     Status: Abnormal   Collection Time    10/07/13  6:30 AM      Result Value Range   Sodium 138  135 - 145 mEq/L   Potassium 3.4 (*) 3.5 - 5.1 mEq/L   Chloride 97  96 - 112 mEq/L   CO2 31  19 - 32 mEq/L   Glucose, Bld 141 (*) 70 - 99 mg/dL   BUN 23  6 - 23 mg/dL   Creatinine, Ser 1.61  0.50 - 1.10 mg/dL   Calcium 9.0  8.4 - 09.6 mg/dL   GFR calc non Af Amer 82 (*) >90 mL/min   GFR calc Af Amer >90  >90 mL/min   Comment: (NOTE)     The eGFR has been calculated  using the CKD EPI equation.     This calculation has not been validated in all clinical situations.     eGFR's persistently <90 mL/min signify possible Chronic Kidney     Disease.  OCCULT BLOOD X 1 CARD TO LAB, STOOL     Status: None   Collection Time    10/07/13  3:28 PM      Result Value Range   Fecal Occult Bld NEGATIVE  NEGATIVE  CBC     Status: Abnormal   Collection Time    10/08/13  5:18 AM      Result Value Range   WBC 9.8  4.0 - 10.5 K/uL   RBC 4.15  3.87 - 5.11 MIL/uL   Hemoglobin 10.1 (*) 12.0 - 15.0 g/dL   HCT 04.5 (*) 40.9 - 81.1 %   MCV 79.5  78.0 - 100.0 fL   MCH 24.3 (*) 26.0 - 34.0 pg   MCHC 30.6  30.0 - 36.0 g/dL   RDW 91.4 (*) 78.2 - 95.6 %   Platelets 177  150 - 400 K/uL     HEENT: normal Cardio: RRR and no murmur Resp: decreased BS R base and mildly labored mouth breathing,  GI: BS positive and non distended Extremity:  Pulses positive and No Edema Skin:   Intact Neuro: Lethargic, Anxious and Dysarthric Musc/Skel:  Other hamstring  spasm Gen NAD Flexor withdrawal spasticity left lower extremity, sustained clonus left ankle Motor strength is 0/5 left upper extremity trace hip extension left lower extremity Right upper extremity 4+/5 in the deltoid, bicep, tricep, grip right lower extremity 4/5 in the hip flexor knee extensor ankle dorsiflexor plantar flexor Sensation is absent to light touch in the left upper extremity but withdraws to pain Left lower extremity withdrawal to pain, absent light touch Right upper and right lower light touch is intact Patient with likely field cut on the left side.  She follows simple commands but remains quite distracted  Assessment/Plan: 1. Functional deficits secondary to R MCA infarct with L HP which require 3+ hours per day of interdisciplinary therapy in a comprehensive inpatient rehab setting. Physiatrist is providing close team supervision and 24 hour management of active medical problems listed below. Physiatrist  and rehab team continue to assess barriers to discharge/monitor patient progress toward functional and medical goals. FIM: FIM - Bathing Bathing Steps Patient Completed: Chest;Left Arm;Right upper leg;Left upper leg Bathing: 1: Two helpers  FIM - Upper Body Dressing/Undressing Upper body dressing/undressing: 1: Two helpers FIM - Lower Body Dressing/Undressing Lower body dressing/undressing: 1: Two helpers  FIM - Toileting Toileting: 0: Activity did not occur  FIM - Diplomatic Services operational officer Devices: Human resources officer Transfers: 1-To toilet/BSC: Total A (helper does all/Pt. < 25%);1-From toilet/BSC: Total A (helper does all/Pt. < 25%);1-Two helpers  FIM - Banker Devices: HOB elevated;Bed rails Bed/Chair Transfer: 1: Supine > Sit: Total A (helper does all/Pt. < 25%);1: Bed > Chair or W/C: Total A (helper does all/Pt. < 25%);1: Two helpers  FIM - Locomotion: Wheelchair Distance: 150 Locomotion: Wheelchair: 1: Total Assistance/staff pushes wheelchair (Pt<25%) FIM - Locomotion: Ambulation Locomotion: Ambulation Assistive Devices: Other (comment) (+2 A (3 musketeers)) Ambulation/Gait Assistance: 1: +2 Total assist Locomotion: Ambulation: 0: Activity did not occur  Comprehension Comprehension Mode: Auditory Comprehension: 2-Understands basic 25 - 49% of the time/requires cueing 51 - 75% of the time  Expression Expression Mode: Verbal Expression: 2-Expresses basic 25 - 49% of the time/requires cueing 50 - 75% of the time. Uses single words/gestures.  Social Interaction Social Interaction Mode: Asleep Social Interaction: 2-Interacts appropriately 25 - 49% of time - Needs frequent redirection.  Problem Solving Problem Solving Mode: Asleep Problem Solving: 1-Solves basic less than 25% of the time - needs direction nearly all the time or does not effectively solve problems and may need a restraint for  safety  Memory Memory Mode: Asleep Memory: 1-Recognizes or recalls less than 25% of the time/requires cueing greater than 75% of the time  Medical Problem List and Plan:  1. DVT Prophylaxis/Anticoagulation: Pharmaceutical: Lovenox  2. Pain Management: prn tylenol  3. Mood: continue Prozac for mood stabilization. Patient with cognitive deficits with poor/minimal insight---LCSW to follow for evaluation.  4. Neuropsych: This patient is not capable of making decisions on her own behalf.  5. HTN: Monitor with bid checks and adjust medications as indicated.  6. Recent LGIB with ABLA: Serial H/H and stool guaiacs as now on ASA. hgb 10.1 yesterday, recent guaiacs negative  -recheck cbc monday 7. A fib: Will monitor heart rate with bid checks. Continue betapace.  8. Dysphagia: NPO, trials of D1 with SLP 9. Hypokalemia: Dilutional--added supplement. --recheck monday 10. Spasticity: PRAFO left lower extremity.  -ROM with therapy  -Zanaflex Qhs for flexor spasms, increased to 6mg  qhs---I hesitate to increase any further given her cognitive/arousal status 11.  Hx  of Grade 1 diastolic CHF, responded to Lasix.  Resumed demadex, monitor for azotemia 12.  Mental status changes related to UTI and CHF. CT head with normal evolution of infarct.  -rx UTI and medical issues. Limit neurosedating meds LOS (Days) 7 A FACE TO FACE EVALUATION WAS PERFORMED  SWARTZ,ZACHARY T 10/09/2013, 8:35 AM

## 2013-10-09 NOTE — Plan of Care (Signed)
Problem: RH BLADDER ELIMINATION Goal: RH STG MANAGE BLADDER WITH ASSISTANCE STG Manage Bladder With mod Assistance  Outcome: Not Progressing Continues to need I/O caths due to inability to void

## 2013-10-09 NOTE — Progress Notes (Signed)
Speech Language Pathology Daily Session Note  Patient Details  Name: Tina Chambers MRN: [id number] Date of Birth: [birth year]  Today's Date: 10/09/2013 Time: 1330 (co-treat with PT)-1400 Time Calculation (min): 30 min  Short Term Goals: Week 1: SLP Short Term Goal 1 (Week 1): Patient will perform pharyngeal strengthening exercieses with Max multimodal cueing. SLP Short Term Goal 2 (Week 1): Patient will recall basic, daily information with the use of an external aid with Max multimodal cueing. SLP Short Term Goal 3 (Week 1): Patient will demonstrate basic problem solving with Max mulit-modal cueing. SLP Short Term Goal 4 (Week 1): Patient will utilize speech intelligibility strategie with Mod verbal cueing. SLP Short Term Goal 5 (Week 1): Patient will label 1 physical deficit post CVA with Max questioning and verbal cues. SLP Short Term Goal 6 (Week 1): Patient will sustain attention for ~2 minutes with Mod multimodal cueing.  Skilled Therapeutic Interventions: Patient participated in skilled co-treatment with PT. SLP skilled intervention focused on cognition and dysphagia goals. SLP facilitated session with trials of nectar-thick liquids via spoon. With Max verbal and tactile cues to utilize safe swallow strategies, pt did not demonstrate overt s/s of aspiration, although anterior spillage was observed. Patient required Max multi-modal cues to scan to the left during session and to sustain attention for brief periods of time (<30 seconds). PT's participation and expertise positively impacted patient's attention, arousal, and alertness which in turn positively impacted outcomes of p.o. trials and cognitive tasks. Patient's arousal and alertness is improving, however it is essential that patient is aroused during p.o. trials and recommend continued co-treatment with PT until patient can sustain arousal for longer periods of time. Continue with current plan of care.    FIM:   Comprehension Comprehension Mode: Auditory Comprehension: 1-Understands basic less than 25% of the time/requires cueing 75% of the time Expression Expression Mode: Verbal Expression: 2-Expresses basic 25 - 49% of the time/requires cueing 50 - 75% of the time. Uses single words/gestures. Social Interaction Social Interaction: 2-Interacts appropriately 25 - 49% of time - Needs frequent redirection. Problem Solving Problem Solving: 1-Solves basic less than 25% of the time - needs direction nearly all the time or does not effectively solve problems and may need a restraint for safety Memory Memory: 1-Recognizes or recalls less than 25% of the time/requires cueing greater than 75% of the time FIM - Eating Eating Activity: 1: Helper feeds patient (with PO trials)  Pain Pain Assessment Pain Assessment: Faces Faces Pain Scale: No hurt  Therapy/Group: Individual Therapy  Maxcine Ham 10/09/2013, 5:07 PM  Maxcine Ham, M.A. CCC-SLP [phone]

## 2013-10-10 ENCOUNTER — Inpatient Hospital Stay (HOSPITAL_COMMUNITY): Payer: Medicare Other | Admitting: Physical Therapy

## 2013-10-10 NOTE — Progress Notes (Signed)
Patient ID: Tina Chambers, female   DOB: 26-Mar-1927, 79 y.o.   MRN: [id number]   Subjective/Complaints: 11/15.  79 y.o. female with history of HTN, recent pelvic fracture, A Fib--off blood thinners due to GIB (most recent ~ 2 weeks ago), CHF who was admitted on 09/29/13 past being found with facial droop, right gaze preference and left sided weakness. CCT without evidence of bleed and patient underwent cerebral angiogram with complete revascularization of occluded R-MCA with IA tpa and thrombectomy with Solitaire device. Follow up CCT with evolving acute infarct R-MCA territory involving parietal and temporal lobe. 2D echo with EF 50-55% and no wall abnormality, grade 1 diastolic dysfunction. Carotid dopplers with 40-59% R-ICA stenosis and 1- 39% L-ICA stenosis. Extubated on 11/05 and started on D 1, nectar thick liquids due to significant pocketing with spillage as well as decrease LOC. Patient continues with left sided weakness, left inattention with right gaze preference, poor short term memory  Awake and alert. Appears comfortable. Remains confused. Remains on supplemental NG feedings  Patient denies any pains patient denies any bowel or bladder issues   Objective: Vital Signs: Blood pressure 150/61, pulse 82, temperature 98.3 F (36.8 C), temperature source Oral, resp. rate 18, height 5\' 5"  (1.651 m), weight 143 lb 9.6 oz (65.137 kg), SpO2 99.00%. No results found. Results for orders placed during the hospital encounter of 10/02/13 (from the past 72 hour(s))  OCCULT BLOOD X 1 CARD TO LAB, STOOL     Status: None   Collection Time    10/07/13  3:28 PM      Result Value Range   Fecal Occult Bld NEGATIVE  NEGATIVE  CBC     Status: Abnormal   Collection Time    10/08/13  5:18 AM      Result Value Range   WBC 9.8  4.0 - 10.5 K/uL   RBC 4.15  3.87 - 5.11 MIL/uL   Hemoglobin 10.1 (*) 12.0 - 15.0 g/dL   HCT 24.4 (*) 01.0 - 27.2 %   MCV 79.5  78.0 - 100.0 fL   MCH 24.3 (*) 26.0 - 34.0  pg   MCHC 30.6  30.0 - 36.0 g/dL   RDW 53.6 (*) 64.4 - 03.4 %   Platelets 177  150 - 400 K/uL    Patient Vitals for the past 24 hrs:  BP Temp Temp src Pulse Resp SpO2 Weight  10/10/13 0550 150/61 mmHg 98.3 F (36.8 C) Oral 82 18 99 % 143 lb 9.6 oz (65.137 kg)  10/09/13 1500 144/57 mmHg 97.2 F (36.2 C) Axillary 78 18 100 % -    Intake/Output Summary (Last 24 hours) at 10/10/13 7425 Last data filed at 10/10/13 0114  Gross per 24 hour  Intake    560 ml  Output   1600 ml  Net  -1040 ml   HEENT: normal; NGT in place Cardio: RRR and no murmur Resp: decreased BS R base and mildly labored mouth breathing,  GI: BS positive and non distended Extremity:  Pulses positive and No Edema Skin:   Intact Neuro: Lethargic, Anxious and Dysarthric Musc/Skel:  Other hamstring spasm Gen NAD Neuro L HP She follows simple commands but remains quite distracted Extr- no edema; R mitten in place Assessment/Plan: 1. Functional deficits secondary to R MCA infarct with L HP which require 3+ hours per day of interdisciplinary therapy in a comprehensive inpatient rehab setting. 2.  HTN: Monitor with bid checks and adjust medications as indicated.  6. Recent LGIB with  ABLA: Serial H/H and stool guaiacs as now on ASA. Hgb/hct  10.1/33 guaiacs negative  -recheck cbc periodically 7. A fib: Will monitor heart rate with bid checks. Continue betapace.  8. Dysphagia: NPO, trials of D1 with SLP  LOS (Days) 8 A FACE TO FACE EVALUATION WAS PERFORMED  Rogelia Boga 10/10/2013, 9:23 AM

## 2013-10-10 NOTE — Plan of Care (Signed)
Problem: RH BLADDER ELIMINATION Goal: RH STG MANAGE BLADDER WITH ASSISTANCE STG Manage Bladder With mod Assistance  Outcome: Not Progressing Max assist.  Requires I/O caths due to inability to void

## 2013-10-10 NOTE — Progress Notes (Signed)
Physical Therapy Session Note  Patient Details  Name: Tina Chambers MRN: [id number] Date of Birth: [birth year]  Today's Date: 10/10/2013 Time: [address] Time Calculation (min): 45 min  Short Term Goals: Week 1:  PT Short Term Goal 1 (Week 1): Pt will roll to R with side rail and max A.  PT Short Term Goal 2 (Week 1): Pt will roll to L with side rail and mod A.  PT Short Term Goal 3 (Week 1): Pt will transfer supine to edge of bed, edge of bed to supine with siderail and mod A.  PT Short Term Goal 4 (Week 1): Pt will transfer bed to chair, chair to bed to the R side with max A.  PT Short Term Goal 5 (Week 1): Pt will propel w/c 50 feet with R UE and LE, requiring min A.   Therapy Documentation Precautions:  Precautions Precautions: Fall Precaution Comments: L neglect; NG tube; maintain HOB >40 deg Restrictions Weight Bearing Restrictions: No  Pain: denies pain currently  Therapeutic Exercise:(15') manual stretching of L LE especially hamstrings and calf muscles, AROM R and L  LE including long arc quads, hip flexion in sitting  Therapeutic Activitiy:(15') Bed mobility supine to sitting EOB via R logroll with Mod-assist, Transfer training bed->w/c via slideboard toward R with Mod-assist, Transfer training from sitting -> standing inside parallel bars with mild L LE flexion synergy. Neuromuscular Re-education:(15') practiced midline orientation while sitting EOB and while sitting in w/c.  Therapy/Group: Individual Therapy  Joey Lierman J 10/10/2013, 1:10 PM

## 2013-10-11 ENCOUNTER — Inpatient Hospital Stay (HOSPITAL_COMMUNITY): Payer: Medicare Other

## 2013-10-11 LAB — OCCULT BLOOD X 1 CARD TO LAB, STOOL: Fecal Occult Bld: NEGATIVE

## 2013-10-11 NOTE — Progress Notes (Signed)
Occupational therapy  Therapy Note  Patient Details  Name: Tina Chambers MRN: [id number] Date of Birth: 11/25/1927 Today's Date: 10/11/2013  Time:  1400-1500  (60 min) Pain:none Individual session  Pt. Lying in bed upon OT arrival.  Addressed bed mobility, transfers, attending to left, LUE postioning  In weight bearing.  Performed bathing and dressing.  Pt confused talking about getting xrays today, a newborn baby, thought she knew OT's relatives.  Went from sidelining on left side to sit with total assist.  Sat EOB for few minutes with cues for postural control and max assist for balance.  Transferred to wc with sliding board and total assist +2.  Positioned symmetrically in wc.  Pt. Attended stroke support group for 45 minutes and maintained focused attention 50 % of session.       Humberto Seals 10/11/2013, 5:14 PM

## 2013-10-11 NOTE — Plan of Care (Signed)
Problem: RH BOWEL ELIMINATION Goal: RH STG MANAGE BOWEL WITH ASSISTANCE STG Manage Bowel with mod Assistance.  Outcome: Not Progressing Patient total assistance for bowel management   Problem: RH BLADDER ELIMINATION Goal: RH STG MANAGE BLADDER WITH ASSISTANCE STG Manage Bladder With mod Assistance  Outcome: Not Progressing Requires I/O caths for voiding

## 2013-10-11 NOTE — Progress Notes (Signed)
Patient ID: Tina Chambers, female   DOB: 08-07-1927, 79 y.o.   MRN: [id number] Patient ID: Tina Chambers, female   DOB: 04/08/1927, 79 y.o.   MRN: [id number]   Subjective/Complaints:  11/16.  79 y.o. female with history of HTN, recent pelvic fracture, A Fib--off blood thinners due to GIB (most recent ~ 2 weeks ago), CHF who was admitted on 09/29/13 past being found with facial droop, right gaze preference and left sided weakness. CCT without evidence of bleed and patient underwent cerebral angiogram with complete revascularization of occluded R-MCA with IA tpa and thrombectomy with Solitaire device. Follow up CCT with evolving acute infarct R-MCA territory involving parietal and temporal lobe. 2D echo with EF 50-55% and no wall abnormality, grade 1 diastolic dysfunction. Carotid dopplers with 40-59% R-ICA stenosis and 1- 39% L-ICA stenosis. Extubated on 11/05 and started on D 1, nectar thick liquids due to significant pocketing with spillage as well as decrease LOC. Patient continues with left sided weakness, left inattention with right gaze preference, poor short term memory  Awake and alert. Appears comfortable. Remains confused intermittently but appears more alert this AM. Remains on supplemental NG feedings     Objective: Vital Signs: Blood pressure 129/60, pulse 91, temperature 98.4 F (36.9 C), temperature source Axillary, resp. rate 20, height 5\' 5"  (1.651 m), weight 142 lb 10.2 oz (64.7 kg), SpO2 97.00%. No results found. No results found for this or any previous visit (from the past 72 hour(s)).  Patient Vitals for the past 24 hrs:  BP Temp Temp src Pulse Resp SpO2 Weight  10/11/13 0541 129/60 mmHg 98.4 F (36.9 C) Axillary 91 20 97 % 142 lb 10.2 oz (64.7 kg)    Intake/Output Summary (Last 24 hours) at 10/11/13 0844 Last data filed at 10/11/13 0744  Gross per 24 hour  Intake      0 ml  Output   2100 ml  Net  -2100 ml   HEENT: normal; NGT in place Cardio: RRR and  no murmur Resp: decreased BS R base and mildly labored mouth breathing,  GI: BS positive and non distended Extremity:  Pulses positive and No Edema Skin:   Intact Neuro: Lethargic, Anxious and Dysarthric Musc/Skel:  Other hamstring spasm Gen NAD Neuro L HP  Extr- no edema; R mitten in place Assessment/Plan: 1. Functional deficits secondary to R MCA infarct with L HP which require 3+ hours per day of interdisciplinary therapy in a comprehensive inpatient rehab setting. 2.  HTN: Monitor with bid checks and adjust medications as indicated.  6. Recent LGIB with ABLA: Serial H/H and stool guaiacs as now on ASA. Hgb/hct  10.1/33 guaiacs negative  -recheck cbc periodically 7. A fib: Will monitor heart rate with bid checks. Continue betapace. Continue Eliquis 8. Dysphagia: NPO, trials of D1 with SLP; continue NGT feedings  LOS (Days) 9 A FACE TO FACE EVALUATION WAS PERFORMED  Rogelia Boga 10/11/2013, 8:44 AM

## 2013-10-12 ENCOUNTER — Inpatient Hospital Stay (HOSPITAL_COMMUNITY): Payer: Medicare Other | Admitting: Physical Therapy

## 2013-10-12 ENCOUNTER — Inpatient Hospital Stay (HOSPITAL_COMMUNITY): Payer: Medicare Other | Admitting: Occupational Therapy

## 2013-10-12 DIAGNOSIS — I6330 Cerebral infarction due to thrombosis of unspecified cerebral artery: Secondary | ICD-10-CM

## 2013-10-12 DIAGNOSIS — I69991 Dysphagia following unspecified cerebrovascular disease: Secondary | ICD-10-CM

## 2013-10-12 DIAGNOSIS — G8110 Spastic hemiplegia affecting unspecified side: Secondary | ICD-10-CM

## 2013-10-12 LAB — CBC
HCT: 32.6 % — ABNORMAL LOW (ref 36.0–46.0)
MCV: 79.1 fL (ref 78.0–100.0)
Platelets: 219 10*3/uL (ref 150–400)
RBC: 4.12 MIL/uL (ref 3.87–5.11)
RDW: 19.1 % — ABNORMAL HIGH (ref 11.5–15.5)
WBC: 11 10*3/uL — ABNORMAL HIGH (ref 4.0–10.5)

## 2013-10-12 NOTE — Progress Notes (Signed)
Occupational Therapy Session Note  Patient Details  Name: Tina Chambers MRN: [id number] Date of Birth: 1927-04-23  Today's Date: 10/12/2013 Time: 7829-5621 and 3086-5784 Time Calculation (min): 63 min and 33 min  Short Term Goals: Week 2:  OT Short Term Goal 1 (Week 2): Patient will perform bed mobility of roling left <>right with maximal assistance in order to assist caregiver in BADL OT Short Term Goal 2 (Week 2): Pt will scan to Lt environment with min verbal cues to obtain necessary items to complete self-care tasks OT Short Term Goal 3 (Week 2): Pt will complete BSC transfer with max assist of 1 caregiver with use of slide board PRN OT Short Term Goal 4 (Week 2): Pt will complete UB dressing with mod assist OT Short Term Goal 5 (Week 2): Pt will complete bathing with max assist of 1 caregiver  Skilled Therapeutic Interventions/Progress Updates:  1.) Pt seen for ADL retraining with focus on bed mobility, static sitting balance, midline orientation, scanning to Lt, and transfers. Pt in bed upon arrival in a pleasant mood. Therapist performed passive stretching to LLE to attempt to decrease flexor withdrawal. Pt required Max A supine > sit and required Max A for static sitting balance EOB throughout session however had one instance where she maintained static sitting balance for approx 30 seconds with close supervision while leaning forward. Engaged Pt in weight bearing through RUE to decrease pushing towards left and increase midline sitting orientation. Pt completed bathing EOB requiring Min VC to wash face, chest, and abdomen. Pt required Max A, Max tactile cues, increased wait time, and hand over hand to wash upper left leg however was unable to wash due to left neglect and perseveration on reaching for items on right. When dressing Pt assisted in donning right sleeve of shirt and with hand over hand pulling shirt overhead. Pt required +2 to stand to pull up pants due to safety.  Transfer from bed > w/c with slideboard required +2 for safety and sequencing.Second person provided tactile cues to RUE to decrease pushing. Pt sat in front of mirror brushing hair with RUE and perseverated on same spot. Pt left at nurses station.   2.) Pt seen 1:1 OT with focus on scanning to Left and focused attention to task.Therapist wheeled Pt to day room to have less distractions during session. Engaged Pt in peg board activity. Pt required increased wait time (approx 40 sec) to initiate to task using one step cues.Pt attended to task for approx 1-2 min grasping the correct color pegs using RUE and placing them in peg board. As session continued Pt would become distracted by people walking past on her right side and other voices in the room. Pt required Mod-Max VCs and tactile cues throughout rest of session to attend to task.Pt left at nurses station.   Therapy Documentation Precautions:  Precautions Precautions: Fall Precaution Comments: L neglect; NG tube; maintain HOB >40 deg Restrictions Weight Bearing Restrictions: No   Vital Signs: Therapy Vitals BP: 126/1 mmHg Pain: Pain Assessment Pain Assessment: No/denies pain  See FIM for current functional status  Therapy/Group: Individual Therapy  Frederick Klinger 10/12/2013, 11:51 AM

## 2013-10-12 NOTE — Progress Notes (Signed)
Physical Therapy Weekly Progress Note  Patient Details  Name: Tina Chambers MRN: [id number] Date of Birth: Feb 24, 1927  Today's Date: 10/12/2013 Time: 1015-1030 (one hour co-treat SLP 4098-1191) and [id number] Time Calculation (min): 15 and 42 min  Patient has made slow progress and has met 2 of 5 short term goals.  Pt participation in therapy was limited significantly initially secondary to UTI and increased lethargy, decreased arousal, alertness, attention and endurance for therapy.  With UTI being treated pt is demonstrating improved activity tolerance, arousal and alertness during therapy but still presents with significant physical and cognitive deficits.  Patient continues to demonstrate the following deficits: L hemiplegia and L neglect, impaired motor control, muscle grading on R, hypertonicity on L, impaired midline orientation with pushing to L, impaired trunk and head control, balance, gait, impaired orientation, sustained attention, delayed process and impaired initiation and therefore will continue to benefit from skilled PT intervention to enhance overall performance with activity tolerance, balance, postural control, ability to compensate for deficits, functional use of  left upper extremity and left lower extremity, attention, awareness and coordination.  Patient progressing toward long term goals..  Continue plan of care.  PT Short Term Goals Week 1:  PT Short Term Goal 1 (Week 1): Pt will roll to R with side rail and max A.  PT Short Term Goal 1 - Progress (Week 1): Met PT Short Term Goal 2 (Week 1): Pt will roll to L with side rail and mod A.  PT Short Term Goal 2 - Progress (Week 1): Met PT Short Term Goal 3 (Week 1): Pt will transfer supine to edge of bed, edge of bed to supine with siderail and mod A.  PT Short Term Goal 3 - Progress (Week 1): Not met PT Short Term Goal 4 (Week 1): Pt will transfer bed to chair, chair to bed to the R side with max A.  PT Short Term  Goal 4 - Progress (Week 1): Not met PT Short Term Goal 5 (Week 1): Pt will propel w/c 50 feet with R UE and LE, requiring min A.  PT Short Term Goal 5 - Progress (Week 1): Not met Week 2:  PT Short Term Goal 1 (Week 2): Pt will perform rolling in bed to L and R with mod A and max verbal cues to initiate PT Short Term Goal 2 (Week 2): Pt will perform supine <> sit with max A and max verbal cues to initiate PT Short Term Goal 3 (Week 2): Pt will perform bed <> w/c with max A and max verbal cues to initiate PT Short Term Goal 4 (Week 2): Pt will perform gait x 20' in controlled environment with +2 A for WB through L, alertness, sustained attention with max cues PT Short Term Goal 5 (Week 2): Pt will transition to regular manual w/c and maintain trunk and head control for 2 hours OOB with half lap tray for LUE support  Skilled Therapeutic Interventions/Progress Updates:   AM session: co treatment with SLP with focus on arousal, attention to task and L side, head and trunk postural control during self feeding and swallowing of applesauce and apple juice with RUE use of spoon.  Pt transferred w/c <> mat with squat pivot total A and placed in supported sitting with feet on block, back supported with therapy ball to facilitate lumbar lordosis, thoracic extension and decreased cervical flexion and LUE supported on 4 pillows for lateral trunk elongation.  During self feeding and swallowing activities  pt initially required mod A overall for lateral trunk support, lateral weight shifting and head control during sips of apple juice secondary to pushing L laterally and posterior.  By end of session pt was able to perform tasks with RUE and maintain sitting balance and posture in midline with min guard/close supervision.  Pt required only one forward reaching task (as compared to two on Friday to increase arousal and alertness to task when swallowing became fatigued) reaching forward, up and to the L for rings and then  placing them on target x 12 reps with max verbal cues for initiation and for visual scanning to locate ring; pt required concrete commands and extra time to initiate but pt able to reach up and to the L today.  Pt demonstrating improved arousal, attention, initiation, and endurance with activity today.   PM session: Focus of pm session on pt initiation of bed mobility, sit > stand from EOB and transfer bed > w/c.   Pt given concrete, functional one step commands with increased time to process and initiate to thread LE through pants and then advance LE to EOB.  Pt unable to voluntarily activate LLE to thread through pants or advance to EOB and required total A but did perform tasks with RLE with extra time.  Attempted to allow pt extra time to scoot buttocks to EOB and bring trunk upright with RUE support but pt continued to fall laterally to L and unable to self correct with increased time.  Total A needed to bring trunk upright and bring feet to floor.  Cued pt to stand to pull up pants; performed two reps sit <> stand with bilat UE support with pt pulling R side of pants up partially over buttocks with +2 support on R and L side with tactile cues for full LLE extension and upright trunk posture in standing secondary to pt pushing posterior and into trunk flexion.  Required total A to perform squat pivot bed > w/c.  Overall, pt demonstrating improved initiation and sequencing with extra time and simple, functional commands from one week ago.  Will continue to address.   Therapy Documentation Precautions:  Precautions Precautions: Fall Precaution Comments: L neglect; NG tube; maintain HOB >40 deg Restrictions Weight Bearing Restrictions: No Pain: Pain Assessment Pain Assessment: No/denies pain Pain Score: 0-No painNo c/o pain  See FIM for current functional status  Therapy/Group: Individual Therapy and Co-Treatment  Edman Circle [facility] 10/12/2013, 2:54 PM

## 2013-10-12 NOTE — Progress Notes (Signed)
Speech Language Pathology Daily Session Note & Weekly Progress Note  Patient Details  Name: Tina Chambers MRN: [id number] Date of Birth: 10/06/1927  Today's Date: 10/12/2013 Time: 4098-1191 Time Calculation (min): 45 min  Short Term Goals: Week 1: SLP Short Term Goal 1 (Week 1): Patient will perform pharyngeal strengthening exercieses with Max multimodal cueing. SLP Short Term Goal 2 (Week 1): Patient will recall basic, daily information with the use of an external aid with Max multimodal cueing. SLP Short Term Goal 3 (Week 1): Patient will demonstrate basic problem solving with Max mulit-modal cueing. SLP Short Term Goal 4 (Week 1): Patient will utilize speech intelligibility strategie with Mod verbal cueing. SLP Short Term Goal 5 (Week 1): Patient will label 1 physical deficit post CVA with Max questioning and verbal cues. SLP Short Term Goal 6 (Week 1): Patient will sustain attention for ~2 minutes with Mod multimodal cueing.  Skilled Therapeutic Interventions: Patient participated in skilled co-treatment with PT.  SLP skilled intervention focused on cognition, speech, and dysphagia goals.  SLP facilitated session with trials of Dys.1 and nectar-thick liquids via spoon; patient demonstrated wet vocal quality x1 when she was distracted and required Mod verbal and tactile cues to produce volitional cough.  Additionally, patient required Max multi-modal cues to utilize safe swallow strategies.  Patient sustained attention for 3 to 4 bites and sips during eating; patient required Max multi-modal cueing to sustain attention for ~2 minutes during other tasks. Patient required Max multi-modal cues to scan to the left during session when asked, although she did intermittently initiate left scan independently during session.  Patient also demonstrated a louder vocal quality with Min cues from the clinician.  The participation of PT positively impacted patient's overall performance as their  expertise facilitated patient's arousal and alertness during p.o. trials and cognitive tasks.  SLP hopes to minimize the need for PT in the near future as the patient's alertness and arousal improves.  Continue with current plan of care.   FIM:  Comprehension Comprehension Mode: Auditory Comprehension: 2-Understands basic 25 - 49% of the time/requires cueing 51 - 75% of the time Expression Expression Mode: Verbal Expression: 2-Expresses basic 25 - 49% of the time/requires cueing 50 - 75% of the time. Uses single words/gestures. Social Interaction Social Interaction: 2-Interacts appropriately 25 - 49% of time - Needs frequent redirection. Problem Solving Problem Solving: 1-Solves basic less than 25% of the time - needs direction nearly all the time or does not effectively solve problems and may need a restraint for safety Memory Memory: 2-Recognizes or recalls 25 - 49% of the time/requires cueing 51 - 75% of the time FIM - Eating Eating Activity: 4: Help with picking up utensils;1: Helper performs IV, parenteral, or tube feeding  Pain Pain Assessment Pain Assessment: No/denies pain Pain Score: 0-No pain  Therapy/Group: Individual Therapy   Speech Language Pathology Weekly Progress Note  Patient Details  Name: Tina Chambers MRN: [id number] Date of Birth: 01/09/1927  Today's Date: 10/12/2013  Short Term Goals: Week 1: SLP Short Term Goal 1 (Week 1): Patient will perform pharyngeal strengthening exercieses with Max multimodal cueing. SLP Short Term Goal 1 - Progress (Week 1): Not met SLP Short Term Goal 2 (Week 1): Patient will recall basic, daily information with the use of an external aid with Max multimodal cueing. SLP Short Term Goal 2 - Progress (Week 1): Not met SLP Short Term Goal 3 (Week 1): Patient will demonstrate basic problem solving with Max mulit-modal cueing. SLP Short  Term Goal 3 - Progress (Week 1): Not met SLP Short Term Goal 4 (Week 1): Patient will  utilize speech intelligibility strategie with Mod verbal cueing. SLP Short Term Goal 4 - Progress (Week 1): Met SLP Short Term Goal 5 (Week 1): Patient will label 1 physical deficit post CVA with Max questioning and verbal cues. SLP Short Term Goal 5 - Progress (Week 1): Not met SLP Short Term Goal 6 (Week 1): Patient will sustain attention for ~2 minutes with Mod multimodal cueing. SLP Short Term Goal 6 - Progress (Week 1): Not met Week 2: SLP Short Term Goal 1 (Week 2): Patient will sustain attention to trials of Dys.1 and nectar-thick liquids via spoon for 5 to 6 bites with Mod multi-modal cueing. SLP Short Term Goal 2 (Week 2): Patient will utilize speech intelligibility strategies with Min verbal cueing. SLP Short Term Goal 3 (Week 2): Patient will find item at midline during structured task with Mod mulit-modal cueing. SLP Short Term Goal 4 (Week 2): Patient will demonstrate topic maintence during an SLP initiated topic for 2 turns with Max multi-modal cueing. SLP Short Term Goal 5 (Week 2): Patient will label 1 physical deficit post CVA with Max questioning and verbal cues.  Weekly Progress Updates: Patient met 1 out of 6 short term goals this reporting period.  Patient made gains in utilizing safe swallow and intelligibility strategies.  Most limiting factors continue to be her awareness, attention, and problem solving abilities.  Patient's overall arousal and alertness has improved since admission, although they continue to significantly impact safety with p.o. and cognitive functioning.  Co-treatments with PT has positively impacted arousal and alertness and patient will continue to benefit from their expertise; individual SLP treatments will be efficient as the patient's arousal and alertness improve.  Overall, it is recommended that the patient continue to receive skilled SLP services to continue to address speech and cognitive deficits in addition to her dysphagia to maximize safety,  increase functional independence, and reduce burden of care prior to discharge.   SLP Intensity: Minumum of 1-2 x/day, 30 to 90 minutes SLP Frequency: 5 out of 7 days SLP Duration/Estimated Length of Stay: Discharge 11/28 SLP Treatment/Interventions: Cognitive remediation/compensation;Speech/Language facilitation;Therapeutic Activities;Therapeutic Exercise;Dysphagia/aspiration precaution training;Oral motor exercises;Patient/family education SLP Eating/Swallowing Anticipated Outcome(s): Min A SLP Communication Anticipated Outcome(s): Min A SLP Cognition Anticipated Outcome(s): Min A   Desma Mcgregor E 10/12/2013, 1:07 PM

## 2013-10-12 NOTE — Progress Notes (Signed)
The skilled treatment and weekly progress notes have been reviewed and SLP is in agreement. Dadrian Ballantine, M.A., CCC-SLP [phone]  

## 2013-10-12 NOTE — Progress Notes (Signed)
The skilled treatment note has been reviewed and SLP is in agreement. Milany Geck, M.A., CCC-SLP [phone]  

## 2013-10-12 NOTE — Progress Notes (Signed)
Subjective/Complaints: 79 y.o. female with history of HTN, recent pelvic fracture, A Fib--off blood thinners due to GIB (most recent ~ 2 weeks ago), CHF who was admitted on 09/29/13 past being found with facial droop, right gaze preference and left sided weakness. CCT without evidence of bleed and patient underwent cerebral angiogram with complete revascularization of occluded R-MCA with IA tpa and thrombectomy with Solitaire device. Follow up CCT with evolving acute infarct R-MCA territory involving parietal and temporal lobe. 2D echo with EF 50-55% and no wall abnormality, grade 1 diastolic dysfunction. Carotid dopplers with 40-59% R-ICA stenosis and 1- 39% L-ICA stenosis. Extubated on 11/05 and started on D 1, nectar thick liquids due to significant pocketing with spillage as well as decrease LOC. Patient continues with left sided weakness, left inattention with right gaze preference, poor short term memory  Oriented to person  Patient denies any pains patient denies any bowel or bladder issues Review of Systems - unable to do ROS secondary to mental status  Objective: Vital Signs: Blood pressure 126/1, pulse 87, temperature 98.6 F (37 C), temperature source Oral, resp. rate 18, height 5\' 5"  (1.651 m), weight 65 kg (143 lb 4.8 oz), SpO2 99.00%. No results found. Results for orders placed during the hospital encounter of 10/02/13 (from the past 72 hour(s))  OCCULT BLOOD X 1 CARD TO LAB, STOOL     Status: None   Collection Time    10/11/13 12:50 PM      Result Value Range   Fecal Occult Bld NEGATIVE  NEGATIVE  OCCULT BLOOD X 1 CARD TO LAB, STOOL     Status: None   Collection Time    10/11/13 10:34 PM      Result Value Range   Fecal Occult Bld NEGATIVE  NEGATIVE  CBC     Status: Abnormal   Collection Time    10/12/13  4:45 AM      Result Value Range   WBC 11.0 (*) 4.0 - 10.5 K/uL   RBC 4.12  3.87 - 5.11 MIL/uL   Hemoglobin 10.3 (*) 12.0 - 15.0 g/dL   HCT 78.2 (*) 95.6 - 21.3 %   MCV  79.1  78.0 - 100.0 fL   MCH 25.0 (*) 26.0 - 34.0 pg   MCHC 31.6  30.0 - 36.0 g/dL   RDW 08.6 (*) 57.8 - 46.9 %   Platelets 219  150 - 400 K/uL     HEENT: normal Cardio: RRR and no murmur Resp: decreased BS R base and mildly labored mouth breathing,  GI: BS positive and non distended Extremity:  Pulses positive and No Edema Skin:   Intact Neuro: Lethargic, Anxious and Dysarthric Musc/Skel:  Other hamstring spasm Gen NAD Flexor withdrawal spasticity left lower extremity,  Motor strength is 0/5 left upper extremity trace hip extension left lower extremity Right upper extremity 4+/5 in the deltoid, bicep, tricep, grip right lower extremity 4/5 in the hip flexor knee extensor ankle dorsiflexor plantar flexor Sensation is absent to light touch in the left upper extremity but withdraws to pain Left lower extremity withdrawal to pain, absent light touch Right upper and right lower light touch is intact Patient with likely field cut on the left side.  She follows simple commands but is internally distracted  Assessment/Plan: 1. Functional deficits secondary to large R MCA infarct with L HP which require 3+ hours per day of interdisciplinary therapy in a comprehensive inpatient rehab setting. Physiatrist is providing close team supervision and 24 hour management of  active medical problems listed below. Physiatrist and rehab team continue to assess barriers to discharge/monitor patient progress toward functional and medical goals. FIM: FIM - Bathing Bathing Steps Patient Completed: Chest;Left Arm;Abdomen Bathing: 1: Two helpers  FIM - Upper Body Dressing/Undressing Upper body dressing/undressing: 1: Total-Patient completed less than 25% of tasks FIM - Lower Body Dressing/Undressing Lower body dressing/undressing: 1: Two helpers  FIM - Toileting Toileting: 0: Activity did not occur  FIM - Diplomatic Services operational officer Devices: Human resources officer  Transfers: 0-Activity did not occur  FIM - Banker Devices: Sliding board Bed/Chair Transfer: 1: Supine > Sit: Total A (helper does all/Pt. < 25%);1: Two helpers;1: Bed > Chair or W/C: Total A (helper does all/Pt. < 25%)  FIM - Locomotion: Wheelchair Distance: 150 Locomotion: Wheelchair: 1: Total Assistance/staff pushes wheelchair (Pt<25%) FIM - Locomotion: Ambulation Locomotion: Ambulation Assistive Devices: Other (comment) (+2 A (3 musketeers)) Ambulation/Gait Assistance: 1: +2 Total assist Locomotion: Ambulation: 0: Activity did not occur  Comprehension Comprehension Mode: Auditory Comprehension: 2-Understands basic 25 - 49% of the time/requires cueing 51 - 75% of the time  Expression Expression Mode: Verbal Expression: 2-Expresses basic 25 - 49% of the time/requires cueing 50 - 75% of the time. Uses single words/gestures.  Social Interaction Social Interaction Mode: Asleep Social Interaction: 2-Interacts appropriately 25 - 49% of time - Needs frequent redirection.  Problem Solving Problem Solving Mode: Asleep Problem Solving: 1-Solves basic less than 25% of the time - needs direction nearly all the time or does not effectively solve problems and may need a restraint for safety  Memory Memory Mode: Asleep Memory: 2-Recognizes or recalls 25 - 49% of the time/requires cueing 51 - 75% of the time  Medical Problem List and Plan:  1. DVT Prophylaxis/Anticoagulation: Pharmaceutical: Lovenox  2. Pain Management: prn tylenol  3. Mood: continue Prozac for mood stabilization. Patient with cognitive deficits with poor/minimal insight---LCSW to follow for evaluation.  4. Neuropsych: This patient is not capable of making decisions on her own behalf.  5. HTN: Monitor with bid checks and adjust medications as indicated.  6. Recent LGIB with ABLA: Monitor H/H and stool guaiacs as now on ASA .  7. A fib: Will monitor heart rate with bid checks.  Continue betapace.  8. Dysphagia:NPO, trials of D1 with SLP 9. Hypokalemia: Dilutional--added supplement.  10. Spasticity: PRAFO left lower extremity.  -ROM with therapy  -Zanaflex Qhs for flexor spasms, increase to 8mg  qhs 11.  Hx of Grade 1 diastolic CHF, responded to Lasix.  Resume demadex, monitor for azotemia 12.  Mental status changes related to UTI and CHF. CT head with normal evolution of infarct.  -rx UTI and medical issues. Limit neurosedating meds LOS (Days) 10 A FACE TO FACE EVALUATION WAS PERFORMED  KIRSTEINS,ANDREW E 10/12/2013, 8:25 AM

## 2013-10-12 NOTE — Progress Notes (Signed)
I have reviewed and agree with the attached treatment note.  Keyshaun Exley, OTR/L 

## 2013-10-13 ENCOUNTER — Inpatient Hospital Stay (HOSPITAL_COMMUNITY): Payer: Medicare Other | Admitting: Occupational Therapy

## 2013-10-13 ENCOUNTER — Inpatient Hospital Stay (HOSPITAL_COMMUNITY): Payer: Medicare Other | Admitting: Physical Therapy

## 2013-10-13 DIAGNOSIS — G8110 Spastic hemiplegia affecting unspecified side: Secondary | ICD-10-CM

## 2013-10-13 DIAGNOSIS — I69998 Other sequelae following unspecified cerebrovascular disease: Secondary | ICD-10-CM

## 2013-10-13 DIAGNOSIS — R209 Unspecified disturbances of skin sensation: Secondary | ICD-10-CM

## 2013-10-13 DIAGNOSIS — I6330 Cerebral infarction due to thrombosis of unspecified cerebral artery: Secondary | ICD-10-CM

## 2013-10-13 DIAGNOSIS — I69991 Dysphagia following unspecified cerebrovascular disease: Secondary | ICD-10-CM

## 2013-10-13 LAB — BASIC METABOLIC PANEL
Creatinine, Ser: 0.67 mg/dL (ref 0.50–1.10)
GFR calc Af Amer: >90 mL/min (ref >90)
GFR calc non Af Amer: 79 mL/min — ABNORMAL LOW (ref >90)
Glucose, Bld: 142 mg/dL — ABNORMAL HIGH (ref 70–99)
Potassium: 4.2 mEq/L (ref 3.5–5.1)
Sodium: 132 mEq/L — ABNORMAL LOW (ref 135–145)

## 2013-10-13 LAB — OCCULT BLOOD X 1 CARD TO LAB, STOOL: Fecal Occult Bld: NEGATIVE

## 2013-10-13 NOTE — Progress Notes (Signed)
I have reviewed and agree with the attached treatment note.  Aliza Moret, OTR/L 

## 2013-10-13 NOTE — Progress Notes (Signed)
I have reviewed and I agree with the following treatment note.  [place], PT, DPT  

## 2013-10-13 NOTE — Progress Notes (Signed)
Subjective/Complaints: 79 y.o. female with history of HTN, recent pelvic fracture, A Fib--off blood thinners due to GIB (most recent ~ 2 weeks ago), CHF who was admitted on 09/29/13 past being found with facial droop, right gaze preference and left sided weakness. CCT without evidence of bleed and patient underwent cerebral angiogram with complete revascularization of occluded R-MCA with IA tpa and thrombectomy with Solitaire device. Follow up CCT with evolving acute infarct R-MCA territory involving parietal and temporal lobe. 2D echo with EF 50-55% and no wall abnormality, grade 1 diastolic dysfunction. Carotid dopplers with 40-59% R-ICA stenosis and 1- 39% L-ICA stenosis. Extubated on 11/05 and started on D 1, nectar thick liquids due to significant pocketing with spillage as well as decrease LOC. Patient continues with left sided weakness, left inattention with right gaze preference, poor short term memory  Oriented to person and Chambers (but Tina Chambers not Tina Chambers)  Patient denies any pains patient denies any bowel or bladder issues Review of Systems - unable to do ROS secondary to mental status  Objective: Vital Signs: Blood pressure 94/68, pulse 109, temperature 99.4 F (37.4 C), temperature source Axillary, resp. rate 18, height 5\' 5"  (1.651 m), weight 62.5 kg (137 lb 12.6 oz), SpO2 100.00%. No results found. Results for orders placed during the Chambers encounter of 10/02/13 (from the past 72 hour(s))  OCCULT BLOOD X 1 CARD TO LAB, STOOL     Status: None   Collection Time    10/11/13 12:50 PM      Result Value Range   Fecal Occult Bld NEGATIVE  NEGATIVE  OCCULT BLOOD X 1 CARD TO LAB, STOOL     Status: None   Collection Time    10/11/13 10:34 PM      Result Value Range   Fecal Occult Bld NEGATIVE  NEGATIVE  CBC     Status: Abnormal   Collection Time    10/12/13  4:45 AM      Result Value Range   WBC 11.0 (*) 4.0 - 10.5 K/uL   RBC 4.12  3.87 - 5.11 MIL/uL   Hemoglobin 10.3 (*) 12.0  - 15.0 g/dL   HCT 40.9 (*) 81.1 - 91.4 %   MCV 79.1  78.0 - 100.0 fL   MCH 25.0 (*) 26.0 - 34.0 pg   MCHC 31.6  30.0 - 36.0 g/dL   RDW 78.2 (*) 95.6 - 21.3 %   Platelets 219  150 - 400 K/uL  BASIC METABOLIC PANEL     Status: Abnormal   Collection Time    10/13/13  6:45 AM      Result Value Range   Sodium 132 (*) 135 - 145 mEq/L   Potassium 4.2  3.5 - 5.1 mEq/L   Chloride 95 (*) 96 - 112 mEq/L   CO2 28  19 - 32 mEq/L   Glucose, Bld 142 (*) 70 - 99 mg/dL   BUN 25 (*) 6 - 23 mg/dL   Creatinine, Ser 0.86  0.50 - 1.10 mg/dL   Calcium 9.2  8.4 - 57.8 mg/dL   GFR calc non Af Amer 79 (*) >90 mL/min   GFR calc Af Amer >90  >90 mL/min   Comment: (NOTE)     The eGFR has been calculated using the CKD EPI equation.     This calculation has not been validated in all clinical situations.     eGFR's persistently <90 mL/min signify possible Chronic Kidney     Disease.     HEENT: normal  Cardio: RRR and no murmur Resp: decreased BS R base and mildly labored mouth breathing,  GI: BS positive and non distended Extremity:  Pulses positive and No Edema Skin:   Intact Neuro: Lethargic, Anxious and Dysarthric Musc/Skel:  Other hamstring spasm Gen NAD Flexor withdrawal spasticity left lower extremity,  Motor strength is 0/5 left upper extremity trace hip extension left lower extremity Right upper extremity 4+/5 in the deltoid, bicep, tricep, grip right lower extremity 4/5 in the hip flexor knee extensor ankle dorsiflexor plantar flexor Sensation is absent to light touch in the left upper extremity but withdraws to pain Left lower extremity withdrawal to pain, absent light touch Right upper and right lower light touch is intact Patient with likely field cut on the left side.  She follows simple commands but is internally distracted  Assessment/Plan: 1. Functional deficits secondary to large R MCA infarct with L HP which require 3+ hours per day of interdisciplinary therapy in a comprehensive  inpatient rehab setting. Physiatrist is providing close team supervision and 24 hour management of active medical problems listed below. Physiatrist and rehab team continue to assess barriers to discharge/monitor patient progress toward functional and medical goals. FIM: FIM - Bathing Bathing Steps Patient Completed: Chest;Left Arm;Abdomen;Right upper leg Bathing: 1: Two helpers  FIM - Upper Body Dressing/Undressing Upper body dressing/undressing: 1: Total-Patient completed less than 25% of tasks FIM - Lower Body Dressing/Undressing Lower body dressing/undressing: 1: Two helpers  FIM - Toileting Toileting: 0: Activity did not occur  FIM - Diplomatic Services operational officer Devices: Human resources officer Transfers: 0-Activity did not occur  FIM - Banker Devices: Sliding board Bed/Chair Transfer: 1: Supine > Sit: Total A (helper does all/Pt. < 25%);1: Two helpers;1: Bed > Chair or W/C: Total A (helper does all/Pt. < 25%)  FIM - Locomotion: Wheelchair Distance: 150 Locomotion: Wheelchair: 1: Total Assistance/staff pushes wheelchair (Pt<25%) FIM - Locomotion: Ambulation Locomotion: Ambulation Assistive Devices: Other (comment) (+2 A (3 musketeers)) Ambulation/Gait Assistance: 1: +2 Total assist Locomotion: Ambulation: 0: Activity did not occur  Comprehension Comprehension Mode: Auditory Comprehension: 2-Understands basic 25 - 49% of the time/requires cueing 51 - 75% of the time  Expression Expression Mode: Verbal Expression: 2-Expresses basic 25 - 49% of the time/requires cueing 50 - 75% of the time. Uses single words/gestures.  Social Interaction Social Interaction Mode: Asleep Social Interaction: 2-Interacts appropriately 25 - 49% of time - Needs frequent redirection.  Problem Solving Problem Solving Mode: Asleep Problem Solving: 1-Solves basic less than 25% of the time - needs direction nearly all the time  or does not effectively solve problems and may need a restraint for safety  Memory Memory Mode: Asleep Memory: 2-Recognizes or recalls 25 - 49% of the time/requires cueing 51 - 75% of the time  Medical Problem List and Plan:  1. DVT Prophylaxis/Anticoagulation: Pharmaceutical: Lovenox  2. Pain Management: prn tylenol  3. Mood: continue Prozac for mood stabilization. Patient with cognitive deficits with poor/minimal insight---LCSW to follow for evaluation.  4. Neuropsych: This patient is not capable of making decisions on her own behalf.  5. HTN: Monitor with bid checks and adjust medications as indicated.  6. Recent LGIB with ABLA: Monitor H/H and stool guaiacs as now on ASA .  7. A fib: Will monitor heart rate with bid checks. Continue betapace.  8. Dysphagia:NPO, trials of D1 with SLP 9. Hypokalemia: Dilutional--added supplement.  10. Spasticity: PRAFO left lower extremity.  -ROM with therapy  -Zanaflex Qhs for  flexor spasms, increase to 8mg  qhs 11.  Hx of Grade 1 diastolic CHF, responded to Lasix.  Resume demadex, monitor for azotemia 12.  Mental status changes related to UTI and CHF, improved after tx of UTI,levaquin through 11/20. CT head with normal evolution of infarct.  Limit neurosedating meds LOS (Days) 11 A FACE TO FACE EVALUATION WAS PERFORMED  Tina Chambers E 10/13/2013, 8:07 AM

## 2013-10-13 NOTE — Plan of Care (Signed)
Problem: RH KNOWLEDGE DEFICIT Goal: RH STG INCREASE KNOWLEDGE OF HYPERTENSION Patient and/or caregiver will verbalize understanding of signs and symptoms of hypertension and ways to manage hypertension with max asst  Outcome: Not Progressing Not able to verbalize

## 2013-10-13 NOTE — Progress Notes (Signed)
Speech Language Pathology Daily Session Note  Patient Details  Name: Tina Chambers MRN: [id number] Date of Birth: September 24, 1927  Today's Date: 10/13/2013 Time: 1025-1100 Time Calculation (min): 35 min  Short Term Goals: Week 2: SLP Short Term Goal 1 (Week 2): Patient will sustain attention to trials of Dys.1 and nectar-thick liquids via spoon for 5 to 6 bites with Mod multi-modal cueing. SLP Short Term Goal 2 (Week 2): Patient will utilize speech intelligibility strategies with Min verbal cueing. SLP Short Term Goal 3 (Week 2): Patient will find item at midline during structured task with Mod mulit-modal cueing. SLP Short Term Goal 4 (Week 2): Patient will demonstrate topic maintence during an SLP initiated topic for 2 turns with Max multi-modal cueing. SLP Short Term Goal 5 (Week 2): Patient will label 1 physical deficit post CVA with Max questioning and verbal cues.  Skilled Therapeutic Interventions: Patient participated in skilled co-treatment with PT.  PT facilitated patient's participation with optimal positioning which positively impacted patient's sustained attention, participation and p.o. intake.  SLP's skilled intervention focused on cognition and dysphagia goals.  SLP facilitated session with trials of Dys.1 and nectar-thick liquids via spoon; patient demonstrated improved attention to and initiation of self- feeding today.  Patient consumed 4oz puree and 2oz nectar-thick liquids via teaspoon with occasional wet vocal quality which appeared to clear with cues to cough or throat clear.  Mod multi-modal cues were provided to utilize safe swallow strategies; patient sustained attention to self-feeding for ~5 bites or sips.  Recommend to continue with current plan of care and for SLP to consider trial tray tomorrow.   FIM:  Comprehension Comprehension Mode: Auditory Comprehension: 2-Understands basic 25 - 49% of the time/requires cueing 51 - 75% of the time Expression Expression  Mode: Verbal Expression: 2-Expresses basic 25 - 49% of the time/requires cueing 50 - 75% of the time. Uses single words/gestures. Social Interaction Social Interaction: 2-Interacts appropriately 25 - 49% of time - Needs frequent redirection. Problem Solving Problem Solving: 1-Solves basic less than 25% of the time - needs direction nearly all the time or does not effectively solve problems and may need a restraint for safety Memory Memory: 2-Recognizes or recalls 25 - 49% of the time/requires cueing 51 - 75% of the time FIM - Eating Eating Activity: 4: Help with picking up utensils;1: Helper performs IV, parenteral, or tube feeding  Pain Pain Assessment Pain Assessment: No/denies pain  Therapy/Group: Individual Therapy  Charlane Ferretti., CCC-SLP [phone]  Tina Chambers 10/13/2013, 2:16 PM

## 2013-10-13 NOTE — Plan of Care (Signed)
Problem: RH BOWEL ELIMINATION Goal: RH STG MANAGE BOWEL WITH ASSISTANCE STG Manage Bowel with mod Assistance.  Outcome: Not Progressing Incontinent of bowel unaware, total assist of staff for hygiene and depends change

## 2013-10-13 NOTE — Progress Notes (Signed)
Physical Therapy Session Note  Patient Details  Name: Tina Chambers MRN: [id number] Date of Birth: 04-23-1927  Today's Date: 10/13/2013 Time: 1015-1025 (1015-1100 full time co-treat with SLP), [id number] Time Calculation (min): 10 min, 51 min  Short Term Goals: Week 1:  PT Short Term Goal 1 (Week 1): Pt will roll to R with side rail and max A.  PT Short Term Goal 1 - Progress (Week 1): Met PT Short Term Goal 2 (Week 1): Pt will roll to L with side rail and mod A.  PT Short Term Goal 2 - Progress (Week 1): Met PT Short Term Goal 3 (Week 1): Pt will transfer supine to edge of bed, edge of bed to supine with siderail and mod A.  PT Short Term Goal 3 - Progress (Week 1): Not met PT Short Term Goal 4 (Week 1): Pt will transfer bed to chair, chair to bed to the R side with max A.  PT Short Term Goal 4 - Progress (Week 1): Not met PT Short Term Goal 5 (Week 1): Pt will propel w/c 50 feet with R UE and LE, requiring min A.  PT Short Term Goal 5 - Progress (Week 1): Not met Week 2:  PT Short Term Goal 1 (Week 2): Pt will perform rolling in bed to L and R with mod A and max verbal cues to initiate PT Short Term Goal 2 (Week 2): Pt will perform supine <> sit with max A and max verbal cues to initiate PT Short Term Goal 3 (Week 2): Pt will perform bed <> w/c with max A and max verbal cues to initiate PT Short Term Goal 4 (Week 2): Pt will perform gait x 20' in controlled environment with +2 A for WB through L, alertness, sustained attention with max cues PT Short Term Goal 5 (Week 2): Pt will transition to regular manual w/c and maintain trunk and head control for 2 hours OOB with half lap tray for LUE support  Skilled Therapeutic Interventions/Progress Updates:    AM session: Co-treat with SLP for facilitation of sitting balance and arousal for feeding trials of dys. 1 textures and nectar thick liquids. Pt received supine in bed. Pt performed supine>L side lying mod A, side lying>sit total A  and bed>w/c total A. Pt transported to day room to minimize distractions during trials and cognitive tasks. Pt transferred w/c>mat total A. Pt required close supervision-min A for sitting balance with LUE supported on block and therapist behind pt on ball to facilitate thoracic extension and upright posture. Pt demonstrated delayed but improved initiation of reaching with RUE for spoon during feeding. Pt demonstrated improved attention to L side visually scanning and turning head past midline several times during session. Pt demonstrated improved arousal and attention to task this session and did not require reaching/weight shifting task to increase arousal between feeding.  Pt transferred mat>w/c total assist. Pt left seated in w/c at nurse's station.   PM session: Pt received in bed. Pt demonstrated significant flexor tone RLE and flexed trunk and neck posture upon arrival. Pt performed supine>L side lying with HOB elevated with max A and L side lying>sit total A. Performed leaning to R for L trunk extension and PROM of LLE to decrease tone before transfer. Pt transferred bed>w/c total A. Transported pt to day room and pt transferred w/c>mat total A. Performed seated task at table identifying and watering plants with RUE. Pt required min guard for sitting balance and manual facilitation for L  trunk elongation, lumbar lordosis, and upright posture. Pt required max verbal cues to initiate task with significant delay in initiation and max verbal cues to maintain attention to task. Pt able to attend to task <20 seconds. Pt required max verbal cues to attend to L and find objects L of midline. Pt performed cleaning/wiping task at table requiring max verbal cues to bring washcloth to L, successfully crossing midline x1. Pt transferred mat>w/c total A. Pt left seated in w/c at nurse's station.   Therapy Documentation Precautions:  Precautions Precautions: Fall Precaution Comments: L neglect; NG tube; maintain HOB  >40 deg Restrictions Weight Bearing Restrictions: No   Pain: Pt denies pain.  See FIM for current functional status  Therapy/Group: Co-Treatment  July Nickson, [facility] 10/13/2013, 11:54 AM

## 2013-10-13 NOTE — Progress Notes (Signed)
Occupational Therapy Session Note  Patient Details  Name: Tina Chambers MRN: [id number] Date of Birth: Dec 13, 1926  Today's Date: 10/13/2013 Time: 0729-0829 and [id number] Time Calculation (min): 60 min and 30 min  Short Term Goals: Week 2:  OT Short Term Goal 1 (Week 2): Patient will perform bed mobility of roling left <>right with maximal assistance in order to assist caregiver in BADL OT Short Term Goal 2 (Week 2): Pt will scan to Lt environment with min verbal cues to obtain necessary items to complete self-care tasks OT Short Term Goal 3 (Week 2): Pt will complete BSC transfer with max assist of 1 caregiver with use of slide board PRN OT Short Term Goal 4 (Week 2): Pt will complete UB dressing with mod assist OT Short Term Goal 5 (Week 2): Pt will complete bathing with max assist of 1 caregiver  Skilled Therapeutic Interventions/Progress Updates:  1.) Pt seen for ADL retraining with focus on bed mobility, static sitting balance, attention to left side, and increased participation with self care tasks. Pt in asleep in bed upon arrival. Required VC and tactile cues to open eyes. Pt required Max A supine > sit. Marland Kitchen Pt completed bathing EOB with Mod-Max A  to maintain sitting balancet. Pt with one instance of sitting with close supervision for 30 sec after weight bearing through right elbow to decrease pushing. Pt required multimodal cues to participate with washing. Pt assisted in donning RUE and pulling shirt over trunk with hand over hand assistance. Pt assisted in pulling up pants on EOB. Attempted to stand x3 with +2 assist to pull pants over hips however Pt displayed extreme pushing and decreased initiation to stand. Pt sit >supine Total A. Required +2 to roll to each side to pull pants over hips. Pt left in bed with all needs in reach.   2.) Pt seen 1:1 OT with focus on increased attention to left and focused attention. Pt in family room with son upon arrival. Therapist wheeled Pt to [facility]  gym to decrease distractions during session.  Therapist asked Pt to identify the color and number of cards on Pt's left. Pt was able to identify colors and numbers on each card with VCs to look in the direction of card but unable to place cards on match therefore modified task to shapes. Pt required Max VCs and increased wait time to look to left and grab item with RUE however was able to match the correct colors. Towards end of session Pt became fatigued as noted by Pt closing her eyes and decreased attention. Transferred +2 from w/c > bed as second helper held Pt's RUE due to Pt pushing. Pt left in bed with all needs in reach and son on the way to visit. During transfer noticed Pt had BM, notified RN.  Therapy Documentation Precautions:  Precautions Precautions: Fall Precaution Comments: L neglect; NG tube; maintain HOB >40 deg Restrictions Weight Bearing Restrictions: No    Vital Signs: Therapy Vitals Temp: 99.4 F (37.4 C) Temp src: Axillary Pulse Rate: 109 Resp: 18 BP: 94/68 mmHg Patient Position, if appropriate: Lying Oxygen Therapy SpO2: 100 % O2 Device: None (Room air) Pain: Pain Assessment Pain Assessment: 0-10 Pain Score: 0-No pain Patients Stated Pain Goal: 0 Multiple Pain Sites: No    See FIM for current functional status  Therapy/Group: Individual Therapy  Donna Christen 10/13/2013, 10:05 AM

## 2013-10-13 NOTE — Progress Notes (Signed)
Social Work Patient ID: Tina Chambers, female   DOB: 1927-04-11, 79 y.o.   MRN: [id number] Met with son and pt who reports have visited area facilties and have two want this worker to send FL2 to [facility] and [employer] in [place]. Aware her po and feedings need to be addressed before can consider transfer-pt can not go with a NG tube.

## 2013-10-13 NOTE — Plan of Care (Signed)
Problem: RH BLADDER ELIMINATION Goal: RH STG MANAGE BLADDER WITH ASSISTANCE STG Manage Bladder With mod Assistance  Outcome: Not Progressing Pt incontinent of bladder, depends total assist of staff for hygiene, change of depends

## 2013-10-14 ENCOUNTER — Inpatient Hospital Stay (HOSPITAL_COMMUNITY): Payer: Medicare Other | Admitting: Occupational Therapy

## 2013-10-14 ENCOUNTER — Inpatient Hospital Stay (HOSPITAL_COMMUNITY): Payer: Medicare Other

## 2013-10-14 ENCOUNTER — Inpatient Hospital Stay (HOSPITAL_COMMUNITY): Payer: Medicare Other | Admitting: Physical Therapy

## 2013-10-14 MED ORDER — IOHEXOL 300 MG/ML  SOLN: 50.0000 mL | Freq: Once | INTRAMUSCULAR | Status: AC | PRN

## 2013-10-14 NOTE — Progress Notes (Signed)
NUTRITION FOLLOW-UP  DOCUMENTATION CODES Per approved criteria  -Not Applicable   INTERVENTION: Continue Jevity 1.2 at 70 ml/hr x 20 hours. 200 ml H2O flush every 6 hours. TF regimen provides: 1680 kcal, 77 grams protein, 1930 ml H2O. Diet texture and liquid consistency per SLP. RD to continue to follow nutrition care plan.  NUTRITION DIAGNOSIS: Inadequate oral intake related to dysphagia as evidenced by meal completion <25%. Ongoing.  Goal: Pt to meet >/= 90% of their estimated nutrition needs - met.  Monitor:  Diet advancement, TF tolerance, weights, labs  ASSESSMENT: Pt admitted to [facility] 11/4 with R MCA, IV tPA given and thrombectomy. Pt extubated 11/5. Pt started on Dysphagia and has significant pocketing and spillage. Pt transferred to CIR on 11/7.   Pt advanced to Dysphagia 1 diet with Nectar Thickened Liquids with SLP only.  Pt is currently ordered for Jevity 1.2 at 70 ml/hr x 20 hours daily. This provides: 1680 kcal, 77 grams protein, 1930 ml H2O. Free water flushes of 200 ml QID ordered, this provides 800 ml additional free water daily. Weight remains stable. RN confirms that pt is tolerating this regimen well at this time.  Potassium is now WNL. Sodium is low at 132.  Height: Ht Readings from Last 1 Encounters:  10/02/13 5\' 5"  (1.651 m)    Weight: Wt Readings from Last 1 Encounters:  10/14/13 139 lb 12.4 oz (63.4 kg)  Admit wt 144 lb - stable  BMI:  Body mass index is 23.26 kg/(m^2). Weight is WNL.  Estimated Nutritional Needs: Kcal: 1500-1700 Protein: 75-85 grams Fluid: > 1.5 L/day  Skin: sacrum wound  Diet Order: Dysphagia 1 with Nectar Thickened Liquids - WITH SLP ONLY  EDUCATION NEEDS: -No education needs identified at this time   Intake/Output Summary (Last 24 hours) at 10/14/13 1107 Last data filed at 10/14/13 0445  Gross per 24 hour  Intake      0 ml  Output   1556 ml  Net  -1556 ml    Last BM: 11/18  Labs:   Recent Labs Lab  10/13/13 0645  NA 132*  K 4.2  CL 95*  CO2 28  BUN 25*  CREATININE 0.67  CALCIUM 9.2  GLUCOSE 142*    CBG (last 3)  No results found for this basename: GLUCAP,  in the last 72 hours  Scheduled Meds: . amLODipine  5 mg Oral Daily  . antiseptic oral rinse  15 mL Mouth Rinse BID  . apixaban  2.5 mg Per Tube BID  . atorvastatin  10 mg Oral q1800  . FLUoxetine  10 mg Oral Daily  . free water  200 mL Per Tube TID WC & HS  . levothyroxine  75 mcg Oral QAC breakfast  . pantoprazole sodium  40 mg Per Tube Daily  . potassium chloride  20 mEq Oral BID  . sotalol  80 mg Oral Q12H  . tiZANidine  6 mg Oral QHS  . torsemide  5 mg Oral Daily    Continuous Infusions: . feeding supplement (JEVITY 1.2 CAL) 1,000 mL (10/13/13 0604)    Jarold Motto MS, RD, LDN Pager: [phone] After-hours pager: [phone]

## 2013-10-14 NOTE — Progress Notes (Signed)
Occupational Therapy Session Note  Patient Details  Name: Tina Chambers MRN: [id number] Date of Birth: [birth year]  Today's Date: 10/14/2013 Time: 0731-0824, 4098-1191, and 4782-9562 Time Calculation (min): 53 min, 35 min, and 34 min  Short Term Goals: Week 2:  OT Short Term Goal 1 (Week 2): Patient will perform bed mobility of roling left <>right with maximal assistance in order to assist caregiver in BADL OT Short Term Goal 2 (Week 2): Pt will scan to Lt environment with min verbal cues to obtain necessary items to complete self-care tasks OT Short Term Goal 3 (Week 2): Pt will complete BSC transfer with max assist of 1 caregiver with use of slide board PRN OT Short Term Goal 4 (Week 2): Pt will complete UB dressing with mod assist OT Short Term Goal 5 (Week 2): Pt will complete bathing with max assist of 1 caregiver  Skilled Therapeutic Interventions/Progress Updates:  1.) Pt seen for ADL retraining with focus on increased participation with bathing, dressing, and increased attention to task. Pt in bed upon arrival. Supine > sit Max A due to decreased strength and trunk control. Required Max- Total A of second person to sit EOB to maintain sitting balance. Pt completed bathing EOB requiring Max multimodal cues due to decreased attention and internal distraction. Pt did initiate washing face, chest, and right upper leg with one VC each. Pt perseverated when washing right upper leg requiring tactile cues to wash left upper leg. Pt completed UB dressing EOB assisting in donning RUE and pulling shirt overhead with hand over hand assistance. Pt assisted with donning right pants leg sitting EOB with +2 to maintain sitting balance, donned pants + 2 rolling in bed to pull pants over hips. Pt left in bed with Radiologist to go to X-ray.   2.) Pt seen 1:1 OT with focus on left attention and attention to task. Pt in room in w/c with daughter upon arrival. Transfer + 2 with slide board w/c > bed due  to pushing and for safety. Pt sat EOB with Max-Total A from second person to maintain sitting balance. Engaged in therapeutic activity with focus with following one step directions and scanning to left with use of clothes pin activity.Pt required Max multimodal cues and increased wait time to engage and complete activity. Pt left supine in bed with all needs in reach.   3.) Pt seen 1:1 OT with focus on increased attention, trunk control, and sitting balance. Pt supine in bed upon arrival. Required Max A supine > sit however Pt initiated bringing RLE to EOB with multimodal cues. Pt engaged in reaching activity with RUE while second person sat behind to support sitting balance providing manual facilitation at Lt hip to promote midline sitting balance. Engaged Pt in reaching activity with RUE to increase trunk control and midline orientation when sitting. Pt maintained upright sitting balance x 4 with two times being up to 5 seconds. Pt displayed delayed response to therapist's cues this session, recalling and responding to therapist's cue greater than 30 seconds later. Engaged Pt to look in mirror at midline to achieve upright posture, Pt able to identify that she was not sitting upright, but unable to correct. Pt left with CNA due to incontinence at end of session.   Therapy Documentation Precautions:  Precautions Precautions: Fall Precaution Comments: L neglect; NG tube; maintain HOB >40 deg Restrictions Weight Bearing Restrictions: No General: General Missed Time Reason: X-Ray Vital Signs: Therapy Vitals Temp: 97.2 F (36.2 C) Temp src: Oral  Pulse Rate: 96 Resp: 20 BP: 129/76 mmHg Patient Position, if appropriate: Lying Oxygen Therapy SpO2: 97 % O2 Device: None (Room air)  See FIM for current functional status  Therapy/Group: Individual Therapy  Donna Christen 10/14/2013, 8:27 AM

## 2013-10-14 NOTE — Progress Notes (Signed)
Patient ID: Tina Chambers, female   DOB: February 14, 1927, 80 y.o.   MRN: [id number] Request received for placement of a perc gastrostomy tube in pt with hx CVA, dysphagia. Pt currently on eliquis and received dose today. IR protocol requires that pt remain off eliquis for 48 hrs prior to tube placement. Will plan to evaluate pt on 11/20 for G tube placement and issue further orders if pt candidate. Nurse made aware of eliquis protocol.

## 2013-10-14 NOTE — Progress Notes (Signed)
Social Work Patient ID: Tina Chambers, female   DOB: 04/26/1927, 79 y.o.   MRN: [id number] Met with  daughter to inform team conference progression toward goals and targeted discharge date.  Informed MD will be discussing with the children the need For a PEG tube for pt's adequate po intake.  She did want to speak with MD and infomred him of this and he did talk with daughter.  Family looking at area facilities And deciding upon one once medically ready for transfer.  Aware GI consult to be made and waiting period for pt to be taken off her blood thinners before the procedure. Will complete FL2 and begin NH process.

## 2013-10-14 NOTE — Progress Notes (Signed)
Subjective/Complaints: 79 y.o. female with history of HTN, recent pelvic fracture, A Fib--off blood thinners due to GIB (most recent ~ 2 weeks ago), CHF who was admitted on 09/29/13 past being found with facial droop, right gaze preference and left sided weakness. CCT without evidence of bleed and patient underwent cerebral angiogram with complete revascularization of occluded R-MCA with IA tpa and thrombectomy with Solitaire device. Follow up CCT with evolving acute infarct R-MCA territory involving parietal and temporal lobe. 2D echo with EF 50-55% and no wall abnormality, grade 1 diastolic dysfunction. Carotid dopplers with 40-59% R-ICA stenosis and 1- 39% L-ICA stenosis. Extubated on 11/05 and started on D 1, nectar thick liquids due to significant pocketing with spillage as well as decrease LOC. Patient continues with left sided weakness, left inattention with right gaze preference, poor short term memory  Sitting up with OT, Poor midline awareness, feels like she is leaning Right but actually leaning left  Patient denies any pains patient denies any bowel or bladder issues Review of Systems - unable to do ROS secondary to mental status  Objective: Vital Signs: Blood pressure 129/76, pulse 96, temperature 97.2 F (36.2 C), temperature source Oral, resp. rate 20, height 5\' 5"  (1.651 m), weight 63.4 kg (139 lb 12.4 oz), SpO2 97.00%. No results found. Results for orders placed during the hospital encounter of 10/02/13 (from the past 72 hour(s))  OCCULT BLOOD X 1 CARD TO LAB, STOOL     Status: None   Collection Time    10/11/13 12:50 PM      Result Value Range   Fecal Occult Bld NEGATIVE  NEGATIVE  OCCULT BLOOD X 1 CARD TO LAB, STOOL     Status: None   Collection Time    10/11/13 10:34 PM      Result Value Range   Fecal Occult Bld NEGATIVE  NEGATIVE  CBC     Status: Abnormal   Collection Time    10/12/13  4:45 AM      Result Value Range   WBC 11.0 (*) 4.0 - 10.5 K/uL   RBC 4.12  3.87 -  5.11 MIL/uL   Hemoglobin 10.3 (*) 12.0 - 15.0 g/dL   HCT 40.9 (*) 81.1 - 91.4 %   MCV 79.1  78.0 - 100.0 fL   MCH 25.0 (*) 26.0 - 34.0 pg   MCHC 31.6  30.0 - 36.0 g/dL   RDW 78.2 (*) 95.6 - 21.3 %   Platelets 219  150 - 400 K/uL  BASIC METABOLIC PANEL     Status: Abnormal   Collection Time    10/13/13  6:45 AM      Result Value Range   Sodium 132 (*) 135 - 145 mEq/L   Potassium 4.2  3.5 - 5.1 mEq/L   Chloride 95 (*) 96 - 112 mEq/L   CO2 28  19 - 32 mEq/L   Glucose, Bld 142 (*) 70 - 99 mg/dL   BUN 25 (*) 6 - 23 mg/dL   Creatinine, Ser 0.86  0.50 - 1.10 mg/dL   Calcium 9.2  8.4 - 57.8 mg/dL   GFR calc non Af Amer 79 (*) >90 mL/min   GFR calc Af Amer >90  >90 mL/min   Comment: (NOTE)     The eGFR has been calculated using the CKD EPI equation.     This calculation has not been validated in all clinical situations.     eGFR's persistently <90 mL/min signify possible Chronic Kidney  Disease.  OCCULT BLOOD X 1 CARD TO LAB, STOOL     Status: None   Collection Time    10/13/13 10:17 AM      Result Value Range   Fecal Occult Bld NEGATIVE  NEGATIVE  OCCULT BLOOD X 1 CARD TO LAB, STOOL     Status: None   Collection Time    10/13/13  1:07 PM      Result Value Range   Fecal Occult Bld NEGATIVE  NEGATIVE  OCCULT BLOOD X 1 CARD TO LAB, STOOL     Status: None   Collection Time    10/14/13  5:07 AM      Result Value Range   Fecal Occult Bld NEGATIVE  NEGATIVE     HEENT: normal Cardio: RRR and no murmur Resp: decreased BS R base and mildly labored mouth breathing,  GI: BS positive and non distended Extremity:  Pulses positive and No Edema Skin:   Intact Neuro: Lethargic, Anxious and Dysarthric Musc/Skel:  Other hamstring spasm Gen NAD Flexor withdrawal spasticity left lower extremity,  Motor strength is 0/5 left upper extremity trace hip extension left lower extremity Right upper extremity 4+/5 in the deltoid, bicep, tricep, grip right lower extremity 4/5 in the hip flexor  knee extensor ankle dorsiflexor plantar flexor  Patient with likely field cut on the left side.  She follows simple commands long latency  Assessment/Plan: 1. Functional deficits secondary to large R MCA infarct with L HP which require 3+ hours per day of interdisciplinary therapy in a comprehensive inpatient rehab setting. Physiatrist is providing close team supervision and 24 hour management of active medical problems listed below. Physiatrist and rehab team continue to assess barriers to discharge/monitor patient progress toward functional and medical goals. Team conference today please see physician documentation under team conference tab, met with team face-to-face to discuss problems,progress, and goals. Formulized individual treatment plan based on medical history, underlying problem and comorbidities. FIM: FIM - Bathing Bathing Steps Patient Completed: Chest;Abdomen;Left Arm;Left upper leg;Right upper leg Bathing: 1: Two helpers  FIM - Upper Body Dressing/Undressing Upper body dressing/undressing: 1: Two helpers FIM - Lower Body Dressing/Undressing Lower body dressing/undressing: 1: Two helpers  FIM - Toileting Toileting: 0: Activity did not occur  FIM - Diplomatic Services operational officer Devices: Human resources officer Transfers: 0-Activity did not occur  FIM - Architectural technologist Transfer: 1: Two helpers  FIM - Locomotion: Wheelchair Distance: 150 Locomotion: Wheelchair: 1: Total Assistance/staff pushes wheelchair (Pt<25%) FIM - Locomotion: Ambulation Locomotion: Ambulation Assistive Devices: Other (comment) (+2 A (3 musketeers)) Ambulation/Gait Assistance: 1: +2 Total assist Locomotion: Ambulation: 0: Activity did not occur  Comprehension Comprehension Mode: Auditory Comprehension: 2-Understands basic 25 - 49% of the time/requires cueing 51 - 75% of the  time  Expression Expression Mode: Verbal Expression: 2-Expresses basic 25 - 49% of the time/requires cueing 50 - 75% of the time. Uses single words/gestures.  Social Interaction Social Interaction Mode: Asleep Social Interaction: 2-Interacts appropriately 25 - 49% of time - Needs frequent redirection.  Problem Solving Problem Solving Mode: Asleep Problem Solving: 1-Solves basic less than 25% of the time - needs direction nearly all the time or does not effectively solve problems and may need a restraint for safety  Memory Memory Mode: Asleep Memory: 2-Recognizes or recalls 25 - 49% of the time/requires cueing 51 - 75% of the time  Medical Problem List and Plan:  1. DVT Prophylaxis/Anticoagulation: Pharmaceutical: Lovenox  2. Pain  Management: prn tylenol  3. Mood: continue Prozac for mood stabilization. Patient with cognitive deficits with poor/minimal insight---LCSW to follow for evaluation.  4. Neuropsych: This patient is not capable of making decisions on her own behalf.  5. HTN: Monitor with bid checks and adjust medications as indicated.  6. Recent LGIB with ABLA: Monitor H/H and stool guaiacs as now on ASA .  7. A fib: Will monitor heart rate with bid checks. Continue betapace.  8. Dysphagia:NPO, trials of D1 with SLP, may need PEG d/w SLP 9. Hypokalemia: Dilutional--added supplement.  10. Spasticity: PRAFO left lower extremity.  -ROM with therapy  -Zanaflex Qhs for flexor spasms, increase to 8mg  qhs 11.  Hx of Grade 1 diastolic CHF, responded to Lasix.  Resume demadex, monitor for azotemia 12.  Mental status changes related to UTI and CHF, improved after tx of UTI,levaquin through 11/20. CT head with normal evolution of infarct.  Limit neurosedating meds LOS (Days) 12 A FACE TO FACE EVALUATION WAS PERFORMED  Romon Devereux E 10/14/2013, 9:07 AM

## 2013-10-14 NOTE — Patient Care Conference (Signed)
Inpatient RehabilitationTeam Conference and Plan of Care Update Date: 10/14/2013   Time: 11:15 AM    Patient Name: Tina Chambers      Medical Record Number: [id number]  Date of Birth: [birth year] Sex: Female         Room/Bed: 4W08C/4W08C-01 Payor Info: Payor: MEDICARE / Plan: MEDICARE PART A AND B / Product Type: *No Product type* /    Admitting Diagnosis: rt cva  Admit Date/Time:  10/02/2013  4:51 PM Admission Comments: No comment available   Primary Diagnosis:  Acute right MCA stroke Principal Problem: Acute right MCA stroke  Patient Active Problem List   Diagnosis Date Noted  . Acute right MCA stroke 10/02/2013  . Atrial fibrillation 10/02/2013  . Essential hypertension, benign 10/02/2013  . Other and unspecified hyperlipidemia 10/02/2013  . Encounter for long-term (current) use of medications 10/02/2013  . Acute ischemic stroke 09/29/2013  . Acute respiratory failure 09/29/2013    Expected Discharge Date: Expected Discharge Date: 10/23/13  Team Members Present: Physician leading conference: Dr. Claudette Laws Social Worker Present: Staci Acosta, LCSW;Becky Markevious Ehmke, LCSW Nurse Present: Gregor Hams, RN PT Present: Edman Circle, PT;Emily Parcell, PT OT Present: Rosalio Loud, Felipa Eth, OT SLP Present: Fae Pippin, SLP PPS Coordinator present : Edson Snowball, Chapman Fitch, RN, CRRN     Current Status/Progress Goal Weekly Team Focus  Medical   UTI resolved, poor endurance, swallow severely impaired  establish safe and adequate intake of calories and fluids  eval for PEG   Bowel/Bladder   inc of bowel and bladder  To  be continent of bladder and  bowel  timed toiletting   Swallow/Nutrition/ Hydration   NPO; trials of Dys.1 and nectar-thick liquids via teaspoon with Max to Mod cues  Least restricitve p.o. intake with Mod assist  Therapeutic trials of nectar-thick liquids via spoon and puree   ADL's   +2 total assist with all mobility and self care  tasks, has moments of increased participation  mod assist transfers, bathing, and LB dressing, min assist dressing and sitting balance  arousal, attention to task and left side, postural control   Mobility   Max-total A bed mobility, total A transfers, +2 total gait  Mod A bed mobility, max A of one person basic transfers, total  A gait with therapy only  Attention to task, initiation, posture, transfers   Communication   Min to [employer] Assist  Increase use of intelligibility strategies, i.e. increase loudness   Safety/Cognition/ Behavioral Observations  Max to Mod Assist; ~5 bites  Mod Assist  Increase attention, awareness, basic problem solving   Pain   n/a  <3  assess and monitor for pain qshift   Skin   R groin bruised, abraison to  L knee,  No skin breakdown  monitor andassess skin q shift      *See Care Plan and progress notes for long and short-term goals.  Barriers to Discharge: heavy assist , NPO Panda tube    Possible Resolutions to Barriers:  PEG, cont rehab, SNF    Discharge Planning/Teaching Needs:  Family wants to pursue NHP-await MD recommendation regarding po status-? Peg      Team Discussion:  Making slow progress, more alert.  Trials Dys 1 nectar not enough to maintain po intake.  MD to discuss with family and get GI consult.  Tone on left worse-MD monitoring  Revisions to Treatment Plan:  NHP awaiting PEG placement   Continued Need for Acute Rehabilitation Level of Care:  The patient requires daily medical management by a physician with specialized training in physical medicine and rehabilitation for the following conditions: Daily direction of a multidisciplinary physical rehabilitation program to ensure safe treatment while eliciting the highest outcome that is of practical value to the patient.: Yes Daily medical management of patient stability for increased activity during participation in an intensive rehabilitation regime.: Yes Daily analysis of  laboratory values and/or radiology reports with any subsequent need for medication adjustment of medical intervention for : Neurological problems;Other  Lucy Chris 10/15/2013, 8:31 AM

## 2013-10-14 NOTE — Progress Notes (Signed)
Physical Therapy Session Note  Patient Details  Name: Tina Chambers MRN: [id number] Date of Birth: 1927/07/15  Today's Date: 10/14/2013 Time: 0930 (full time [phone]; co treat with SLP)-0945 Time Calculation (min): 15 min  Short Term Goals: Week 2:  PT Short Term Goal 1 (Week 2): Pt will perform rolling in bed to L and R with mod A and max verbal cues to initiate PT Short Term Goal 2 (Week 2): Pt will perform supine <> sit with max A and max verbal cues to initiate PT Short Term Goal 3 (Week 2): Pt will perform bed <> w/c with max A and max verbal cues to initiate PT Short Term Goal 4 (Week 2): Pt will perform gait x 20' in controlled environment with +2 A for WB through L, alertness, sustained attention with max cues PT Short Term Goal 5 (Week 2): Pt will transition to regular manual w/c and maintain trunk and head control for 2 hours OOB with half lap tray for LUE support  Skilled Therapeutic Interventions/Progress Updates:   Pt missed 30 min co treat session secondary to being off the floor for xray to assess position of NG tube.  Once pt returned, pt was very sleepy and difficulty to arouse.  Pt required +2 A for supine > sit with HOB elevated and arm rests.  Performed transfers bed > w/c <> mat with total A squat pivot and assistance of second person to minimize pushing through RUE.  Seated on mat performed trunk control and sitting balance training during self feeding task with SLP with LUE supported on block and R pelvis wedged to facilitate L trunk elongation.  Eliminated use of therapy ball behind pt back to facilitate awareness and self correction of posture with extra time and verbal/questioning cues to identify LOB posterior or the to R.  Use of forward reaching for yogurt and tactile cues at scapula to facilitate anterior lean and upright posture during feeding.  Pt only able to maintain upright posture with supervision for 5-10 bites.  Returned to tilt in space w/c and  positioned to minimize pushing and flexion on L.   Therapy Documentation Precautions:  Precautions Precautions: Fall Precaution Comments: L neglect; NG tube; maintain HOB >40 deg Restrictions Weight Bearing Restrictions: No General: Amount of Missed PT Time (min): 30 Minutes Missed Time Reason: X-Ray Pain: Pain Assessment Pain Assessment: No/denies pain Locomotion : Wheelchair Mobility Distance: 150  Other Treatments: Treatments Neuromuscular Facilitation: Activity to increase motor control;Activity to increase timing and sequencing;Activity to increase sustained activation;Activity to increase anterior-posterior weight shifting;Activity to increase lateral weight shifting  See FIM for current functional status  Therapy/Group: Individual Therapy and Co-Treatment  Edman Circle Faucette 10/14/2013, 11:00 AM

## 2013-10-14 NOTE — Progress Notes (Signed)
Speech Language Pathology Daily Session Note  Patient Details  Name: Tina Chambers MRN: [id number] Date of Birth: October 16, 1927  Today's Date: 10/14/2013 Time: 0945-1000 Time Calculation (min): 15 min  Short Term Goals: Week 2: SLP Short Term Goal 1 (Week 2): Patient will sustain attention to trials of Dys.1 and nectar-thick liquids via spoon for 5 to 6 bites with Mod multi-modal cueing. SLP Short Term Goal 2 (Week 2): Patient will utilize speech intelligibility strategies with Min verbal cueing. SLP Short Term Goal 3 (Week 2): Patient will find item at midline during structured task with Mod mulit-modal cueing. SLP Short Term Goal 4 (Week 2): Patient will demonstrate topic maintence during an SLP initiated topic for 2 turns with Max multi-modal cueing. SLP Short Term Goal 5 (Week 2): Patient will label 1 physical deficit post CVA with Max questioning and verbal cues.  Skilled Therapeutic Interventions: Patient participated in skilled co-treatment with PT.  SLP skilled intervention focused on cognition and dysphagia goals.  SLP facilitated session with trials of Dys.1 and demonstrated wet vocal quality x3 due to postural changes and distractions and required Min verbal cues to produce volitional throat clear and swallow.  Patient consumed 4oz of puree during session and sustained attention for 3 to 5 bites with Mod verbal and tactile cues to initiate and to attend.  PT's participation facilitated overall safety and participation with p.o. trials and attention.  Recommend to continue with current plan of care and for trial breakfast tray tomorrow.   FIM:  Comprehension Comprehension Mode: Auditory Comprehension: 2-Understands basic 25 - 49% of the time/requires cueing 51 - 75% of the time Expression Expression Mode: Verbal Expression: 2-Expresses basic 25 - 49% of the time/requires cueing 50 - 75% of the time. Uses single words/gestures. Social Interaction Social Interaction:  2-Interacts appropriately 25 - 49% of time - Needs frequent redirection. Problem Solving Problem Solving: 1-Solves basic less than 25% of the time - needs direction nearly all the time or does not effectively solve problems and may need a restraint for safety Memory Memory: 2-Recognizes or recalls 25 - 49% of the time/requires cueing 51 - 75% of the time FIM - Eating Eating Activity: 4: Help with picking up utensils;1: Helper performs IV, parenteral, or tube feeding;5: Needs verbal cues/supervision  Pain Pain Assessment Pain Assessment: No/denies pain Pain Score: 0-No pain  Therapy/Group: Individual Therapy  Levora Angel 10/14/2013, 1:28 PM

## 2013-10-14 NOTE — Progress Notes (Signed)
The skilled treatment note has been reviewed and SLP is in agreement. Renley Banwart, M.A., CCC-SLP [phone]  

## 2013-10-15 ENCOUNTER — Inpatient Hospital Stay (HOSPITAL_COMMUNITY): Payer: Medicare Other | Admitting: Occupational Therapy

## 2013-10-15 ENCOUNTER — Inpatient Hospital Stay (HOSPITAL_COMMUNITY): Payer: Medicare Other

## 2013-10-15 ENCOUNTER — Encounter (HOSPITAL_COMMUNITY): Payer: Self-pay | Admitting: Radiology

## 2013-10-15 ENCOUNTER — Inpatient Hospital Stay (HOSPITAL_COMMUNITY): Payer: Medicare Other | Admitting: Rehabilitation

## 2013-10-15 ENCOUNTER — Inpatient Hospital Stay (HOSPITAL_COMMUNITY): Payer: Medicare Other | Admitting: Speech Pathology

## 2013-10-15 LAB — CBC
MCV: 78.1 fL (ref 78.0–100.0)
Platelets: 185 10*3/uL (ref 150–400)
RDW: 19.1 % — ABNORMAL HIGH (ref 11.5–15.5)
WBC: 8.1 10*3/uL (ref 4.0–10.5)

## 2013-10-15 LAB — PROTIME-INR
INR: 0.92 (ref 0.00–1.49)
Prothrombin Time: 12.2 seconds (ref 11.6–15.2)

## 2013-10-15 MED ORDER — BACLOFEN 10 MG PO TABS
10.0000 mg | ORAL_TABLET | Freq: Every evening | ORAL | Status: DC | PRN
  Filled 2013-10-15: qty 1

## 2013-10-15 MED ORDER — CEFAZOLIN SODIUM-DEXTROSE 2-3 GM-% IV SOLR
2.0000 g | Freq: Once | INTRAVENOUS | Status: AC
  Administered 2013-10-16: 2 g via INTRAVENOUS
  Filled 2013-10-15: qty 50

## 2013-10-15 NOTE — Progress Notes (Signed)
Physical Therapy Note  Patient Details  Name: DEARDRA HINKLEY MRN: [id number] Date of Birth: Aug 16, 1927 Today's Date: 10/15/2013  9:30 - 10:30 60 minutes Individual session with rehab technician assisting. Patient denies pain.  Patient sitting in wheelchair at nurses station. Patient pushed to gym by therapist. Patient stood in standing frame x 10 minutes. Patient required physical facilitation for trunk extension and verbal cueing for neck extension. Therapist engaged patient in reaching activity with right UE to decrease pushing with R UE. Patient transferred wheelchair to mat with +1 max to total assist. Patient sat edge of mat with verbal cueing to maintain upright sitting for 30 minutes with only 2 LOB to left during attempts to get patient to look and reach to left. Patient engaged in several activities in sitting with focus on attending to left, selective attention, and following 1 step directions. Patient demonstrated great improvement in ability to follow directions and select correct colored item out of choice of 3 and matching colors in colored peg task. Patient transferred mat to wheelchair with max assist. Patient left in wheelchair at nurses station with quick release belt in place and mitt on right hand to prevent pulling feeding tube.   Arelia Longest M 10/15/2013, 3:40 PM

## 2013-10-15 NOTE — Progress Notes (Signed)
Speech Language Pathology Daily Session Note  Patient Details  Name: Tina Chambers MRN: [id number] Date of Birth: 1927/08/30  Today's Date: 10/15/2013 Time: 1130-1200 Time Calculation (min): 30 min  Short Term Goals: Week 2: SLP Short Term Goal 1 (Week 2): Patient will sustain attention to trials of Dys.1 and nectar-thick liquids via spoon for 5 to 6 bites with Mod multi-modal cueing. SLP Short Term Goal 2 (Week 2): Patient will utilize speech intelligibility strategies with Min verbal cueing. SLP Short Term Goal 3 (Week 2): Patient will find item at midline during structured task with Mod mulit-modal cueing. SLP Short Term Goal 4 (Week 2): Patient will demonstrate topic maintence during an SLP initiated topic for 2 turns with Max multi-modal cueing. SLP Short Term Goal 5 (Week 2): Patient will label 1 physical deficit post CVA with Max questioning and verbal cues.  Skilled Therapeutic Interventions: Patient participated in skilled co-treatment with OT. SLP skilled intervention focused on cognition and dysphagia goals.  SLP facilitated session with trails of Dys.1 and nectar-thick liquids via spoon; patient demonstrated wet vocal quality x2 due to distractions in the environment and required Min verbal cues to produce volitional throat clear and swallow.  Patient sustained attention for a maximum of 5 bites; however, as the session progressed, fatigue impacted performance and she was only able to sustain to feeding for 1-2 bites.  Additionally, patient required Mod verbal and tactile cues to initiate and attend to feeding; overall, patient consumed 4oz of magic cup, 1oz of nectar-thick liquid, and 1oz of puree.  Patient required Mod multi-modal cues to scan to the left during session when prompted by clinician, although she did initiate left scanning independently when self-stimulated.  OT expertise and participation facilitated patient's overall alertness and therefore positively impacted  safety and participation with p.o. trials and cognition.  Continue with current plan of care.   FIM:  Comprehension Comprehension Mode: Auditory Comprehension: 3-Understands basic 50 - 74% of the time/requires cueing 25 - 50%  of the time Expression Expression Mode: Verbal Expression: 2-Expresses basic 25 - 49% of the time/requires cueing 50 - 75% of the time. Uses single words/gestures. Social Interaction Social Interaction: 3-Interacts appropriately 50 - 74% of the time - May be physically or verbally inappropriate. Problem Solving Problem Solving: 1-Solves basic less than 25% of the time - needs direction nearly all the time or does not effectively solve problems and may need a restraint for safety Memory Memory: 2-Recognizes or recalls 25 - 49% of the time/requires cueing 51 - 75% of the time FIM - Eating Eating Activity: 1: Helper performs IV, parenteral, or tube feeding;5: Needs verbal cues/supervision;5: Set-up assist for open containers;4: Helper occasionally scoops food on utensil  Pain Pain Assessment Pain Assessment: No/denies pain Pain Score: 0-No pain  Therapy/Group: Individual Therapy  Levora Angel 10/15/2013, 12:22 PM

## 2013-10-15 NOTE — Progress Notes (Signed)
Occupational Therapy Session Note  Patient Details  Name: Tina Chambers MRN: [id number] Date of Birth: Jul 12, 1927  Today's Date: 10/15/2013 Time: 1105-1130 (co-tx with SLP 1105-1200) Time Calculation (min): 25 min  Short Term Goals: Week 2:  OT Short Term Goal 1 (Week 2): Patient will perform bed mobility of roling left <>right with maximal assistance in order to assist caregiver in BADL OT Short Term Goal 2 (Week 2): Pt will scan to Lt environment with min verbal cues to obtain necessary items to complete self-care tasks OT Short Term Goal 3 (Week 2): Pt will complete BSC transfer with max assist of 1 caregiver with use of slide board PRN OT Short Term Goal 4 (Week 2): Pt will complete UB dressing with mod assist OT Short Term Goal 5 (Week 2): Pt will complete bathing with max assist of 1 caregiver  Skilled Therapeutic Interventions/Progress Updates:    Pt seen for skilled co-treat with SLP for facilitation of sitting balance and increased arousal during feeding trials of dys 1 textures and nectar thick liquids.  Pt seated in w/c in RN station upon arrival, transferred w/c > therapy mat with total assist of 2 with max cues for RUE placement to decrease pushing.  Pt required min assist-supervision for sitting balance with therapist behind pt to promote midline orientation and upright sitting balance.  Pt demonstrated delayed but improved initiation (required mod-max multimodal cues) of reaching with RUE for spoon placed in Lt visual field during feeding.  Therapy Documentation Precautions:  Precautions Precautions: Fall Precaution Comments: L neglect; NG tube; maintain HOB >40 deg Restrictions Weight Bearing Restrictions: No Vital Signs: Therapy Vitals BP: 139/66 mmHg Pain:  Pt with no c/o pain this session.  See FIM for current functional status  Therapy/Group: Co-Treatment  Samanthan Dugo, [facility] 10/15/2013, 12:16 PM

## 2013-10-15 NOTE — Progress Notes (Signed)
The skilled treatment note has been reviewed and SLP is in agreement. Shruti Arrey, M.A., CCC-SLP [phone]  

## 2013-10-15 NOTE — Progress Notes (Signed)
I have reviewed and agree with the attached treatment note.  Lynley Killilea, OTR/L 

## 2013-10-15 NOTE — H&P (Signed)
Tina Chambers is an 79 y.o. female.   Chief Complaint: Pt scheduled for percutaneous gastric tube placement in IR Off Eloquis 11/20 and 11/21 Pt suffered CVA 11/4- R Middle cerebral artery IA tpa and clot retrieval Still with significant L sided weakness; AMS; dysphagia Long term care HPI: HTN; Afib; CHF; hypothyroid; HLD; pelvic fx  Past Medical History  Diagnosis Date  . Hypertension   . Atrial fibrillation   . CHF (congestive heart failure)   . Hypothyroid   . Hyperlipidemia   . GIB (gastrointestinal bleeding)     recurrent  . Pelvic fracture     History reviewed. No pertinent past surgical history.  No family history on file. Social History:  reports that she has never smoked. She does not have any smokeless tobacco history on file. She reports that she does not drink alcohol or use illicit drugs.  Allergies:  Allergies  Allergen Reactions  . Phenergan [Promethazine Hcl]     From [facility]    Medications Prior to Admission  Medication Sig Dispense Refill  . atorvastatin (LIPITOR) 10 MG tablet Take 10 mg by mouth daily.      Marland Kitchen diltiazem (DILACOR XR) 180 MG 24 hr capsule Take 180 mg by mouth daily.      . ferrous sulfate 325 (65 FE) MG tablet Take 325 mg by mouth 2 (two) times daily with a meal.      . FLUoxetine (PROZAC) 10 MG capsule Take 10 mg by mouth daily.      Marland Kitchen HYDROcodone-acetaminophen (NORCO/VICODIN) 5-325 MG per tablet Take 1 tablet by mouth daily.      Marland Kitchen HYDROcodone-acetaminophen (NORCO/VICODIN) 5-325 MG per tablet Take 1 tablet by mouth every 4 (four) hours as needed for moderate pain.      Marland Kitchen levothyroxine (SYNTHROID, LEVOTHROID) 75 MCG tablet Take 75 mcg by mouth daily before breakfast.      . omeprazole (PRILOSEC) 20 MG capsule Take 20 mg by mouth daily.      Marland Kitchen OVER THE COUNTER MEDICATION Take 2 capsules by mouth 2 (two) times daily. "Probiotic Intestinal 50-500-100"      . solifenacin (VESICARE) 10 MG tablet Take 10 mg by mouth daily.      . sotalol  (BETAPACE) 80 MG tablet Take 80 mg by mouth 2 (two) times daily.      . temazepam (RESTORIL) 15 MG capsule Take 15 mg by mouth at bedtime as needed for sleep.      Marland Kitchen torsemide (DEMADEX) 10 MG tablet Take 10 mg by mouth daily.      . traMADol (ULTRAM) 50 MG tablet Take by mouth 3 (three) times daily as needed for moderate pain.        Results for orders placed during the hospital encounter of 10/02/13 (from the past 48 hour(s))  OCCULT BLOOD X 1 CARD TO LAB, STOOL     Status: None   Collection Time    10/13/13 10:17 AM      Result Value Range   Fecal Occult Bld NEGATIVE  NEGATIVE  OCCULT BLOOD X 1 CARD TO LAB, STOOL     Status: None   Collection Time    10/13/13  1:07 PM      Result Value Range   Fecal Occult Bld NEGATIVE  NEGATIVE  OCCULT BLOOD X 1 CARD TO LAB, STOOL     Status: None   Collection Time    10/14/13  5:07 AM      Result Value Range  Fecal Occult Bld NEGATIVE  NEGATIVE  CBC     Status: Abnormal   Collection Time    10/15/13  5:20 AM      Result Value Range   WBC 8.1  4.0 - 10.5 K/uL   RBC 3.70 (*) 3.87 - 5.11 MIL/uL   Hemoglobin 9.4 (*) 12.0 - 15.0 g/dL   HCT 16.1 (*) 09.6 - 04.5 %   MCV 78.1  78.0 - 100.0 fL   MCH 25.4 (*) 26.0 - 34.0 pg   MCHC 32.5  30.0 - 36.0 g/dL   RDW 40.9 (*) 81.1 - 91.4 %   Platelets 185  150 - 400 K/uL   Dg Abd 1 View  10/14/2013   CLINICAL DATA:  Feeding tube placement.  EXAM: ABDOMEN - 1 VIEW  COMPARISON:  09/30/2013.  FINDINGS: A Dobbhoff feeding to was unclogged and advanced under fluoroscopic guidance into the region of the ligament of Treitz.  IMPRESSION: Dobbhoff feeding tube tip is at the junction of the 4th portion of the duodenum and the jejunum.   Electronically Signed   By: Loralie Champagne M.D.   On: 10/14/2013 10:03   Dg Vangie Bicker G Tube Plc W/fl-no Rad  10/14/2013   CLINICAL DATA: clogged NG   NASO G TUBE PLACEMENT WITH FLUORO  Fluoroscopy was utilized by the requesting physician.  No radiographic  interpretation.      Review of Systems  Constitutional: Positive for weight loss. Negative for fever.  Respiratory: Negative for shortness of breath.   Gastrointestinal: Negative for nausea, vomiting and abdominal pain.  Neurological: Positive for speech change, focal weakness and weakness.  Psychiatric/Behavioral: Positive for memory loss.    Blood pressure 139/66, pulse 73, temperature 98.9 F (37.2 C), temperature source Oral, resp. rate 17, height 5\' 5"  (1.651 m), weight 140 lb 3.4 oz (63.6 kg), SpO2 99.00%. Physical Exam  Cardiovascular: Normal rate, regular rhythm and normal heart sounds.   No murmur heard. Respiratory: Breath sounds normal. She has no wheezes.  GI: Soft. Bowel sounds are normal. There is no tenderness.  Musculoskeletal:  Pt in wc; Left weakness- not using L side Rt side slow but stronger  Neurological:  Slow to communicate AMS  Psychiatric:  Consented son via phone     Assessment/Plan CVA; dysphagia Need long term care Scheduled for perc G tube in IR Son aware of procedure benefits and risks and agreeable to proceed Consent signed via phone Ancef ordered Eloquis held x 2 days Barium ordered Check kub  Jaquez Farrington A 10/15/2013, 9:48 AM

## 2013-10-15 NOTE — Plan of Care (Signed)
Problem: RH Eating Goal: LTG Patient will perform eating w/assist, cues/equip (OT) LTG: Patient will perform eating with assist, with/without cues using equipment (OT)  Outcome: Not Applicable Date Met:  10/15/13 D/C, defer to SLP   Problem: RH Tub/Shower Transfers Goal: LTG Patient will perform tub/shower transfers w/assist (OT) LTG: Patient will perform tub/shower transfers with assist, with/without cues using equipment (OT)  Outcome: Not Applicable Date Met:  10/15/13 D/C due to most likely will not be a safe option before d/c to next venue of care

## 2013-10-15 NOTE — Progress Notes (Signed)
Subjective/Complaints: 79 y.o. female with history of HTN, recent pelvic fracture, A Fib--off blood thinners due to GIB (most recent ~ 2 weeks ago), CHF who was admitted on 09/29/13 past being found with facial droop, right gaze preference and left sided weakness. CCT without evidence of bleed and patient underwent cerebral angiogram with complete revascularization of occluded R-MCA with IA tpa and thrombectomy with Solitaire device. Follow up CCT with evolving acute infarct R-MCA territory involving parietal and temporal lobe. 2D echo with EF 50-55% and no wall abnormality, grade 1 diastolic dysfunction. Carotid dopplers with 40-59% R-ICA stenosis and 1- 39% L-ICA stenosis. Extubated on 11/05 and started on D 1, nectar thick liquids due to significant pocketing with spillage as well as decrease LOC. Patient continues with left sided weakness, left inattention with right gaze preference, poor short term memory Poor awareness, discussed PEG with pt Review of Systems - unable to do ROS secondary to mental status  Objective: Vital Signs: Blood pressure 133/63, pulse 73, temperature 98.9 F (37.2 C), temperature source Oral, resp. rate 17, height 5\' 5"  (1.651 m), weight 63.6 kg (140 lb 3.4 oz), SpO2 99.00%. Dg Abd 1 View  10/14/2013   CLINICAL DATA:  Feeding tube placement.  EXAM: ABDOMEN - 1 VIEW  COMPARISON:  09/30/2013.  FINDINGS: A Dobbhoff feeding to was unclogged and advanced under fluoroscopic guidance into the region of the ligament of Treitz.  IMPRESSION: Dobbhoff feeding tube tip is at the junction of the 4th portion of the duodenum and the jejunum.   Electronically Signed   By: Loralie Champagne M.D.   On: 10/14/2013 10:03   Dg Vangie Bicker G Tube Plc W/fl-no Rad  10/14/2013   CLINICAL DATA: clogged NG   NASO G TUBE PLACEMENT WITH FLUORO  Fluoroscopy was utilized by the requesting physician.  No radiographic  interpretation.    Results for orders placed during the hospital encounter of 10/02/13 (from  the past 72 hour(s))  BASIC METABOLIC PANEL     Status: Abnormal   Collection Time    10/13/13  6:45 AM      Result Value Range   Sodium 132 (*) 135 - 145 mEq/L   Potassium 4.2  3.5 - 5.1 mEq/L   Chloride 95 (*) 96 - 112 mEq/L   CO2 28  19 - 32 mEq/L   Glucose, Bld 142 (*) 70 - 99 mg/dL   BUN 25 (*) 6 - 23 mg/dL   Creatinine, Ser 1.30  0.50 - 1.10 mg/dL   Calcium 9.2  8.4 - 86.5 mg/dL   GFR calc non Af Amer 79 (*) >90 mL/min   GFR calc Af Amer >90  >90 mL/min   Comment: (NOTE)     The eGFR has been calculated using the CKD EPI equation.     This calculation has not been validated in all clinical situations.     eGFR's persistently <90 mL/min signify possible Chronic Kidney     Disease.  OCCULT BLOOD X 1 CARD TO LAB, STOOL     Status: None   Collection Time    10/13/13 10:17 AM      Result Value Range   Fecal Occult Bld NEGATIVE  NEGATIVE  OCCULT BLOOD X 1 CARD TO LAB, STOOL     Status: None   Collection Time    10/13/13  1:07 PM      Result Value Range   Fecal Occult Bld NEGATIVE  NEGATIVE  OCCULT BLOOD X 1 CARD TO LAB, STOOL  Status: None   Collection Time    10/14/13  5:07 AM      Result Value Range   Fecal Occult Bld NEGATIVE  NEGATIVE  CBC     Status: Abnormal   Collection Time    10/15/13  5:20 AM      Result Value Range   WBC 8.1  4.0 - 10.5 K/uL   RBC 3.70 (*) 3.87 - 5.11 MIL/uL   Hemoglobin 9.4 (*) 12.0 - 15.0 g/dL   HCT 16.1 (*) 09.6 - 04.5 %   MCV 78.1  78.0 - 100.0 fL   MCH 25.4 (*) 26.0 - 34.0 pg   MCHC 32.5  30.0 - 36.0 g/dL   RDW 40.9 (*) 81.1 - 91.4 %   Platelets 185  150 - 400 K/uL     HEENT: normal Cardio: RRR and no murmur Resp: decreased BS R base and mildly labored mouth breathing,  GI: BS positive and non distended Extremity:  Pulses positive and No Edema Skin:   Intact Neuro: Lethargic, Anxious and Dysarthric Musc/Skel:  Other hamstring spasm Gen NAD Flexor withdrawal spasticity left lower extremity,  Motor strength is 0/5 left  upper extremity trace hip extension left lower extremity Right upper extremity 4+/5 in the deltoid, bicep, tricep, grip right lower extremity 4/5 in the hip flexor knee extensor ankle dorsiflexor plantar flexor  Patient with likely field cut on the left side.  She follows simple commands long latency  Assessment/Plan: 1. Functional deficits secondary to large R MCA infarct with L HP which require 3+ hours per day of interdisciplinary therapy in a comprehensive inpatient rehab setting. Physiatrist is providing close team supervision and 24 hour management of active medical problems listed below. Physiatrist and rehab team continue to assess barriers to discharge/monitor patient progress toward functional and medical goals.  FIM: FIM - Bathing Bathing Steps Patient Completed: Chest;Abdomen;Left Arm;Left upper leg;Right upper leg Bathing: 1: Two helpers  FIM - Upper Body Dressing/Undressing Upper body dressing/undressing: 1: Two helpers FIM - Lower Body Dressing/Undressing Lower body dressing/undressing: 1: Two helpers  FIM - Toileting Toileting: 0: Activity did not occur  FIM - Diplomatic Services operational officer Devices: Human resources officer Transfers: 0-Activity did not occur  FIM - Banker Devices: Arm rests;Bed rails;HOB elevated Bed/Chair Transfer: 1: Two helpers  FIM - Locomotion: Wheelchair Distance: 150 Locomotion: Wheelchair: 1: Total Assistance/staff pushes wheelchair (Pt<25%) FIM - Locomotion: Ambulation Locomotion: Ambulation Assistive Devices: Other (comment) (+2 A (3 musketeers)) Ambulation/Gait Assistance: 1: +2 Total assist Locomotion: Ambulation: 0: Activity did not occur  Comprehension Comprehension Mode: Auditory Comprehension: 2-Understands basic 25 - 49% of the time/requires cueing 51 - 75% of the time  Expression Expression Mode: Verbal Expression: 2-Expresses basic 25 - 49% of the  time/requires cueing 50 - 75% of the time. Uses single words/gestures.  Social Interaction Social Interaction Mode: Asleep Social Interaction: 2-Interacts appropriately 25 - 49% of time - Needs frequent redirection.  Problem Solving Problem Solving Mode: Asleep Problem Solving: 1-Solves basic less than 25% of the time - needs direction nearly all the time or does not effectively solve problems and may need a restraint for safety  Memory Memory Mode: Asleep Memory: 2-Recognizes or recalls 25 - 49% of the time/requires cueing 51 - 75% of the time  Medical Problem List and Plan:  1. DVT Prophylaxis/Anticoagulation: Pharmaceutical: Lovenox  2. Pain Management: prn tylenol  3. Mood: continue Prozac for mood stabilization. Patient with cognitive deficits with poor/minimal insight---LCSW  to follow for evaluation.  4. Neuropsych: This patient is not capable of making decisions on her own behalf.  5. HTN: Monitor with bid checks and adjust medications as indicated.  6. Recent LGIB with ABLA: Monitor H/H and stool guaiacs as now on ASA .  7. A fib: Will monitor heart rate with bid checks. Continue betapace.  8. Dysphagia:NPO, trials of D1 with SLP,IR to place PEG when off Eliquis for 48hr 9. Hypokalemia: Dilutional--added supplement.  10. Spasticity: PRAFO left lower extremity.  -ROM with therapy  -Baclofen 10mg  qhs 11.  Hx of Grade 1 diastolic CHF, responded to Lasix.  Resume demadex, monitor for azotemia 12.  Mental status changes related to UTI and CHF, improved after tx of UTI,levaquin through 11/20. CT head with normal evolution of infarct.  Limit neurosedating meds LOS (Days) 13 A FACE TO FACE EVALUATION WAS PERFORMED  Dawon Troop E 10/15/2013, 8:27 AM

## 2013-10-15 NOTE — Progress Notes (Signed)
I have reviewed and agree with the attached treatment note.  Aaliayah Miao, OTR/L 

## 2013-10-15 NOTE — Progress Notes (Signed)
Occupational Therapy Session Note  Patient Details  Name: Tina Chambers MRN: [id number] Date of Birth: 1927/10/15  Today's Date: 10/15/2013 Time: 0730-0830 Time Calculation (min): 60 min  Short Term Goals: Week 2:  OT Short Term Goal 1 (Week 2): Patient will perform bed mobility of roling left <>right with maximal assistance in order to assist caregiver in BADL OT Short Term Goal 2 (Week 2): Pt will scan to Lt environment with min verbal cues to obtain necessary items to complete self-care tasks OT Short Term Goal 3 (Week 2): Pt will complete BSC transfer with max assist of 1 caregiver with use of slide board PRN OT Short Term Goal 4 (Week 2): Pt will complete UB dressing with mod assist OT Short Term Goal 5 (Week 2): Pt will complete bathing with max assist of 1 caregiver  Skilled Therapeutic Interventions/Progress Updates:   1.) Pt seen for ADL retraining with focus on increased participation with bathing and dressing. Pt in bed upon arrival verbalizing she had used the bathroom. Required +2 to roll left and right in bed to change brief. Pt required Total A supine > sit EOB. Pt required + 2 transfer from bed > w/c for safety and pushing with right hand. Pt completed bathing in w/c in front of sink. Pt took wash cloth from therapist and washed face without cues. Pt required Max multimodal cues to complete bathing. Pt would perseverate when washing right upper leg and required hand over hand to begin washing left upper leg. Completed dressing in w/c at sink sit > squat level requiring +2 to pull up pants. Pt helped don right sleeve and pulling shirt over trunk with hand over hand assist. Pt completed oral care and brushing hair at sink. Therapist provided encouragement, verbal cueing, and manual facilitation for midline orientation throughout session Pt left at nurses station.   Therapy Documentation Precautions:  Precautions Precautions: Fall Precaution Comments: L neglect; NG tube;  maintain HOB >40 deg Restrictions Weight Bearing Restrictions: No Vital Signs: Therapy Vitals BP: 139/66 mmHg  See FIM for current functional status  Therapy/Group: Individual Therapy  Donna Christen 10/15/2013, 12:10 PM

## 2013-10-15 NOTE — Progress Notes (Signed)
Physical Therapy Session Note  Patient Details  Name: Tina Chambers MRN: [id number] Date of Birth: 22-Feb-1927  Today's Date: 10/15/2013 Time: 1305-1330 Time Calculation (min): 25 min  Short Term Goals: Week 2:  PT Short Term Goal 1 (Week 2): Pt will perform rolling in bed to L and R with mod A and max verbal cues to initiate PT Short Term Goal 2 (Week 2): Pt will perform supine <> sit with max A and max verbal cues to initiate PT Short Term Goal 3 (Week 2): Pt will perform bed <> w/c with max A and max verbal cues to initiate PT Short Term Goal 4 (Week 2): Pt will perform gait x 20' in controlled environment with +2 A for WB through L, alertness, sustained attention with max cues PT Short Term Goal 5 (Week 2): Pt will transition to regular manual w/c and maintain trunk and head control for 2 hours OOB with half lap tray for LUE support  Skilled Therapeutic Interventions/Progress Updates:   Pt received sitting in w/c in room with son present.  He voices questions about PEG tube placement and type of anesthesia that may be used.  Notified PA of his questions and to speak with him in am about procedure.  Assisted to gym to perform squat pivot transfer w/c <> mat and attempt sitting balance activity.  Performed squat pivot transfer to the L with max assist with mitt on RUE to prevent pulling NG tube, however also utilized this to prevent pushing/grabbing during transfer.  Provided max manual facilitation for increased forward weight shift and assist at BLEs to increase WB and prevent pushing.  During transfer back to w/c, however she requires +2 assist due to pushing with RUE.  On last "scoot" had pt "hug" PT for decreased UE use and she was able to keep R hand on therapist.  While seated at [facility], provided assist at pelvis for more upright posture and also at sternum.   She responds well to cues, however is unable to maintain posture for more than 10-15 seconds.  Attempted to have pt remove pillow  from pillow case and reach for rings, however she would only reach for two rings before standing "I always grab rings."  Assisted back to chair as stated above.  Applied quick release belt, gait belt on leg rests for support of LEs and tilted w/c for positioning and to decrease pressure.  Left at nursing station for safety.   Therapy Documentation Precautions:  Precautions Precautions: Fall Precaution Comments: L neglect; NG tube; maintain HOB >40 deg Restrictions Weight Bearing Restrictions: No   Pain: Pain Assessment Pain Assessment: No/denies pain  See FIM for current functional status  Therapy/Group: Individual Therapy  Vista Deck 10/15/2013, 5:16 PM

## 2013-10-15 NOTE — Progress Notes (Signed)
Social Work Lucy Chris, LCSW Social Worker Signed  Patient Care Conference Service date: 10/14/2013 1:34 PM  Inpatient RehabilitationTeam Conference and Plan of Care Update Date: 10/14/2013   Time: 11:15 AM     Patient Name: Tina Chambers       Medical Record Number: [id number]   Date of Birth: [birth year] Sex: Female         Room/Bed: 4W08C/4W08C-01 Payor Info: Payor: MEDICARE / Plan: MEDICARE PART A AND B / Product Type: *No Product type* /   Admitting Diagnosis: rt cva   Admit Date/Time:  10/02/2013  4:51 PM Admission Comments: No comment available   Primary Diagnosis:  Acute right MCA stroke Principal Problem: Acute right MCA stroke    Patient Active Problem List     Diagnosis  Date Noted   .  Acute right MCA stroke  10/02/2013   .  Atrial fibrillation  10/02/2013   .  Essential hypertension, benign  10/02/2013   .  Other and unspecified hyperlipidemia  10/02/2013   .  Encounter for long-term (current) use of medications  10/02/2013   .  Acute ischemic stroke  09/29/2013   .  Acute respiratory failure  09/29/2013     Expected Discharge Date: Expected Discharge Date: 10/23/13  Team Members Present: Physician leading conference: Dr. Claudette Laws Social Worker Present: Staci Acosta, LCSW;Becky Ermal Haberer, LCSW Nurse Present: Gregor Hams, RN PT Present: Edman Circle, PT;Emily Parcell, PT OT Present: Rosalio Loud, Felipa Eth, OT SLP Present: Fae Pippin, SLP PPS Coordinator present : Edson Snowball, Chapman Fitch, RN, CRRN        Current Status/Progress  Goal  Weekly Team Focus   Medical     UTI resolved, poor endurance, swallow severely impaired  establish safe and adequate intake of calories and fluids  eval for PEG   Bowel/Bladder     inc of bowel and bladder  To  be continent of bladder and  bowel  timed toiletting   Swallow/Nutrition/ Hydration     NPO; trials of Dys.1 and nectar-thick liquids via teaspoon with Max to Mod cues  Least  restricitve p.o. intake with Mod assist  Therapeutic trials of nectar-thick liquids via spoon and puree   ADL's     +2 total assist with all mobility and self care tasks, has moments of increased participation  mod assist transfers, bathing, and LB dressing, min assist dressing and sitting balance  arousal, attention to task and left side, postural control   Mobility     Max-total A bed mobility, total A transfers, +2 total gait  Mod A bed mobility, max A of one person basic transfers, total  A gait with therapy only  Attention to task, initiation, posture, transfers   Communication     Min to [employer] Assist  Increase use of intelligibility strategies, i.e. increase loudness   Safety/Cognition/ Behavioral Observations    Max to Mod Assist; ~5 bites  Mod Assist  Increase attention, awareness, basic problem solving   Pain     n/a  <3  assess and monitor for pain qshift   Skin     R groin bruised, abraison to  L knee,  No skin breakdown  monitor andassess skin q shift     *See Care Plan and progress notes for long and short-term goals.    Barriers to Discharge:  heavy assist , NPO Panda tube      Possible Resolutions to Barriers:    PEG, cont rehab,  SNF      Discharge Planning/Teaching Needs:    Family wants to pursue NHP-await MD recommendation regarding po status-? Peg      Team Discussion:    Making slow progress, more alert.  Trials Dys 1 nectar not enough to maintain po intake.  MD to discuss with family and get GI consult.  Tone on left worse-MD monitoring   Revisions to Treatment Plan:    NHP awaiting PEG placement    Continued Need for Acute Rehabilitation Level of Care: The patient requires daily medical management by a physician with specialized training in physical medicine and rehabilitation for the following conditions: Daily direction of a multidisciplinary physical rehabilitation program to ensure safe treatment while eliciting the highest outcome that is of  practical value to the patient.: Yes Daily medical management of patient stability for increased activity during participation in an intensive rehabilitation regime.: Yes Daily analysis of laboratory values and/or radiology reports with any subsequent need for medication adjustment of medical intervention for : Neurological problems;Other  Lucy Chris 10/15/2013, 8:31 AM         Lucy Chris, LCSW Social Worker Signed  Patient Care Conference Service date: 10/07/2013 2:52 PM  Inpatient RehabilitationTeam Conference and Plan of Care Update Date: 10/07/2013   Time: 11:30 AM     Patient Name: Tina Chambers       Medical Record Number: [id number]   Date of Birth: May 14, 1927 Sex: Female         Room/Bed: 4W08C/4W08C-01 Payor Info: Payor: MEDICARE / Plan: MEDICARE PART A AND B / Product Type: *No Product type* /   Admitting Diagnosis: rt cva   Admit Date/Time:  10/02/2013  4:51 PM Admission Comments: No comment available   Primary Diagnosis:  Acute right MCA stroke Principal Problem: Acute right MCA stroke    Patient Active Problem List     Diagnosis  Date Noted   .  Acute right MCA stroke  10/02/2013   .  Atrial fibrillation  10/02/2013   .  Essential hypertension, benign  10/02/2013   .  Other and unspecified hyperlipidemia  10/02/2013   .  Encounter for long-term (current) use of medications  10/02/2013   .  Acute ischemic stroke  09/29/2013   .  Acute respiratory failure  09/29/2013     Expected Discharge Date: Expected Discharge Date: 10/23/13  Team Members Present: Physician leading conference: Dr. Claudette Laws Social Worker Present: Dossie Der, LCSW Nurse Present: Other (comment) Kennyth Arnold lemmings-RN) PT Present: Edman Circle, PT OT Present: Rosalio Loud, OT;Kris Gellert, Heath Lark, OT SLP Present: Fae Pippin, SLP PPS Coordinator present : Tora Duck, RN, CRRN        Current Status/Progress  Goal  Weekly Team Focus   Medical     UTI, sens  pnd, repeat CT normal evolution of R MCA infarct, confusion mainly related to CVA with medical overlay  maintain stability to allow full rehab participation  followup diagnostic work up for MS changes   Bowel/Bladder     incontinent bladder and bowel, In/out cath q 6hrs-no urge to void   To continent of bladder and  bowel  develop the urge to void   Swallow/Nutrition/ Hydration     NPO; Total assist with therapeutic trials  least restrictic p.o. intake with Mod assist  Therapeutic trials of nectar-thick liquids via spoon and puree   ADL's     +2 total assist with all mobility and self-care tasks  mod assist transfers, bathing,  and LB dressing, min assist dressing and sitting balance  arousal, attention to task and Lt side, postural control   Mobility     +2 total A  min A basic transfers; max A standing and ambulation  Arousal, attention to L side, postural control   Communication     Mod assist  Min assist  increase use of intelligibility strategies, i.e. increase loudness   Safety/Cognition/ Behavioral Observations    Total to Max assist  Mod assist  Increase attention, awareness, orientation   Pain     c/o pain headache, prn med given  <3  assess aand monitor pain q shift   Skin     Brusing in groin and hip area, abrasion on left knee-oam applied  No skin breakdown  Monitor and assess skin q shift     *See Care Plan and progress notes for long and short-term goals.    Barriers to Discharge:  heavy assist ,NPO with Panda      Possible Resolutions to Barriers:    upgrade swallow vs PEG, cont rehab      Discharge Planning/Teaching Needs:    Family awaiting team's recommendations-probable SNF from here, will be too much care to return to ALF      Team Discussion:    Doing better today-more alert.  Treating UTI.  MBS-will try nectar with therapy only.  Alertness and issue at [facility]  Severe flexor withdrawal-MD trying zanaflex for this. Probable NHP due to amount of care pt will  require at discharge.   Revisions to Treatment Plan:    Probable NHP    Continued Need for Acute Rehabilitation Level of Care: The patient requires daily medical management by a physician with specialized training in physical medicine and rehabilitation for the following conditions: Daily direction of a multidisciplinary physical rehabilitation program to ensure safe treatment while eliciting the highest outcome that is of practical value to the patient.: Yes Daily analysis of laboratory values and/or radiology reports with any subsequent need for medication adjustment of medical intervention for : Neurological problems;Other  Lucy Chris 10/07/2013, 2:52 PM          Patient ID: Tina Chambers, female   DOB: 04-Jun-1927, 79 y.o.   MRN: [id number]

## 2013-10-16 ENCOUNTER — Inpatient Hospital Stay (HOSPITAL_COMMUNITY): Payer: Medicare Other | Admitting: Occupational Therapy

## 2013-10-16 ENCOUNTER — Encounter (HOSPITAL_COMMUNITY): Payer: Medicare Other | Admitting: Occupational Therapy

## 2013-10-16 ENCOUNTER — Inpatient Hospital Stay (HOSPITAL_COMMUNITY): Payer: Medicare Other | Admitting: Physical Therapy

## 2013-10-16 ENCOUNTER — Inpatient Hospital Stay (HOSPITAL_COMMUNITY): Payer: Medicare Other

## 2013-10-16 MED ORDER — APIXABAN 2.5 MG PO TABS
2.5000 mg | ORAL_TABLET | Freq: Two times a day (BID) | ORAL | Status: DC
  Administered 2013-10-17 – 2013-10-21 (×9): 2.5 mg via ORAL
  Filled 2013-10-16 (×11): qty 1

## 2013-10-16 MED ORDER — CEFAZOLIN SODIUM-DEXTROSE 2-3 GM-% IV SOLR
2.0000 g | Freq: Once | INTRAVENOUS | Status: AC
  Administered 2013-10-16: 2 g via INTRAVENOUS
  Filled 2013-10-16: qty 50

## 2013-10-16 MED ORDER — FENTANYL CITRATE 0.05 MG/ML IJ SOLN
INTRAMUSCULAR | Status: AC | PRN
  Administered 2013-10-16: 12.5 ug via INTRAVENOUS

## 2013-10-16 MED ORDER — MIDAZOLAM HCL 2 MG/2ML IJ SOLN
INTRAMUSCULAR | Status: AC | PRN
  Administered 2013-10-16: 2 mg via INTRAVENOUS

## 2013-10-16 MED ORDER — BACLOFEN 20 MG PO TABS
20.0000 mg | ORAL_TABLET | Freq: Every evening | ORAL | Status: DC | PRN
  Filled 2013-10-16: qty 1

## 2013-10-16 MED ORDER — IOHEXOL 300 MG/ML  SOLN
50.0000 mL | Freq: Once | INTRAMUSCULAR | Status: AC | PRN
  Administered 2013-10-16: 20 mL via INTRAVENOUS

## 2013-10-16 MED ORDER — FENTANYL CITRATE 0.05 MG/ML IJ SOLN
INTRAMUSCULAR | Status: AC
  Filled 2013-10-16: qty 4

## 2013-10-16 MED ORDER — MIDAZOLAM HCL 2 MG/2ML IJ SOLN
INTRAMUSCULAR | Status: AC
  Filled 2013-10-16: qty 6

## 2013-10-16 NOTE — Progress Notes (Signed)
Speech Language Pathology Daily Session Note  Patient Details  Name: AZENETH CARBONELL MRN: [id number] Date of Birth: 1927-02-06  Today's Date: 10/16/2013 Time: 1020-1035 Time Calculation (min): 15 min  Short Term Goals: Week 2: SLP Short Term Goal 1 (Week 2): Patient will sustain attention to trials of Dys.1 and nectar-thick liquids via spoon for 5 to 6 bites with Mod multi-modal cueing. SLP Short Term Goal 2 (Week 2): Patient will utilize speech intelligibility strategies with Min verbal cueing. SLP Short Term Goal 3 (Week 2): Patient will find item at midline during structured task with Mod mulit-modal cueing. SLP Short Term Goal 4 (Week 2): Patient will demonstrate topic maintence during an SLP initiated topic for 2 turns with Max multi-modal cueing. SLP Short Term Goal 5 (Week 2): Patient will label 1 physical deficit post CVA with Max questioning and verbal cues.  Skilled Therapeutic Interventions: Patient seen for skilled co-treatment session shortly following PEG placement procedure, which impacted patient's arousal and overall ability to participate; however, she participated to the best of her ability.  SLP skilled intervention focused on addressing cognition goals.  Patient required Max-Total assist to initiate self-care tasks, sustain brief attention to and demonstrate timely cessation of.  No instances of left scanning were observed during this session.  PT expertise and participation facilitated patient's positioning for optimal alertness.  Continue with current plan of care.   FIM:  Comprehension Comprehension Mode: Auditory Comprehension: 2-Understands basic 25 - 49% of the time/requires cueing 51 - 75% of the time Expression Expression Mode: Verbal Expression: 1-Expresses basis less than 25% of the time/requires cueing greater than 75% of the time. Social Interaction Social Interaction: 2-Interacts appropriately 25 - 49% of time - Needs frequent redirection. Problem  Solving Problem Solving: 1-Solves basic less than 25% of the time - needs direction nearly all the time or does not effectively solve problems and may need a restraint for safety Memory Memory: 1-Recognizes or recalls less than 25% of the time/requires cueing greater than 75% of the time  Pain Pain Assessment Pain Assessment: No/denies pain Pain Score: 0-No pain  Therapy/Group: Individual Therapy  Charlane Ferretti., CCC-SLP [phone]  Rhylynn Perdomo 10/16/2013, 1:04 PM

## 2013-10-16 NOTE — Progress Notes (Signed)
Occupational Therapy Note  Patient Details  Name: Tina Chambers MRN: [id number] Date of Birth: [birth year] Today's Date: 10/16/2013  Pt missed 60 mins skilled OT treatment session secondary to off unit for PEG placement.  Spoke with RN who reports she had just left prior to session.  Did not return during scheduled session.  Plan to follow up as able.  Rosalio Loud 10/16/2013, 9:42 AM

## 2013-10-16 NOTE — Progress Notes (Signed)
Subjective/Complaints: 79 y.o. female with history of HTN, recent pelvic fracture, A Fib--off blood thinners due to GIB (most recent ~ 2 weeks ago), CHF who was admitted on 09/29/13 past being found with facial droop, right gaze preference and left sided weakness. CCT without evidence of bleed and patient underwent cerebral angiogram with complete revascularization of occluded R-MCA with IA tpa and thrombectomy with Solitaire device. Follow up CCT with evolving acute infarct R-MCA territory involving parietal and temporal lobe. 2D echo with EF 50-55% and no wall abnormality, grade 1 diastolic dysfunction. Carotid dopplers with 40-59% R-ICA stenosis and 1- 39% L-ICA stenosis. Extubated on 11/05 and started on D 1, nectar thick liquids due to significant pocketing with spillage as well as decrease LOC. Patient continues with left sided weakness, left inattention with right gaze preference, poor short term memory Poor awareness, discussed PEG with pt Oriented to person but not place Review of Systems - unable to do ROS secondary to mental status  Objective: Vital Signs: Blood pressure 133/81, pulse 96, temperature 98.7 F (37.1 C), temperature source Oral, resp. rate 18, height 5\' 5"  (1.651 m), weight 64.9 kg (143 lb 1.3 oz), SpO2 100.00%. Dg Abd 1 View  10/14/2013   CLINICAL DATA:  Feeding tube placement.  EXAM: ABDOMEN - 1 VIEW  COMPARISON:  09/30/2013.  FINDINGS: A Dobbhoff feeding to was unclogged and advanced under fluoroscopic guidance into the region of the ligament of Treitz.  IMPRESSION: Dobbhoff feeding tube tip is at the junction of the 4th portion of the duodenum and the jejunum.   Electronically Signed   By: Loralie Champagne M.D.   On: 10/14/2013 10:03   Dg Abd Portable 1v  10/16/2013   CLINICAL DATA:  Evaluate barium prior to GT placement.  EXAM: PORTABLE ABDOMEN - 1 VIEW  COMPARISON:  10/14/2013  FINDINGS: Feeding tube tip proximal jejunum.  Barium throughout the colon.  Diverticula noted.   The possibility of free intraperitoneal air cannot be assessed on a supine view.  Scoliosis lumbar spine convex to the right with degenerative changes.  IMPRESSION: Feeding tube tip proximal jejunum.  Barium throughout the colon.   Electronically Signed   By: Bridgett Larsson M.D.   On: 10/16/2013 07:35   Dg Vangie Bicker G Tube Plc W/fl-no Rad  10/14/2013   CLINICAL DATA: clogged NG   NASO G TUBE PLACEMENT WITH FLUORO  Fluoroscopy was utilized by the requesting physician.  No radiographic  interpretation.    Results for orders placed during the hospital encounter of 10/02/13 (from the past 72 hour(s))  OCCULT BLOOD X 1 CARD TO LAB, STOOL     Status: None   Collection Time    10/13/13 10:17 AM      Result Value Range   Fecal Occult Bld NEGATIVE  NEGATIVE  OCCULT BLOOD X 1 CARD TO LAB, STOOL     Status: None   Collection Time    10/13/13  1:07 PM      Result Value Range   Fecal Occult Bld NEGATIVE  NEGATIVE  OCCULT BLOOD X 1 CARD TO LAB, STOOL     Status: None   Collection Time    10/14/13  5:07 AM      Result Value Range   Fecal Occult Bld NEGATIVE  NEGATIVE  CBC     Status: Abnormal   Collection Time    10/15/13  5:20 AM      Result Value Range   WBC 8.1  4.0 - 10.5 K/uL  RBC 3.70 (*) 3.87 - 5.11 MIL/uL   Hemoglobin 9.4 (*) 12.0 - 15.0 g/dL   HCT 19.1 (*) 47.8 - 29.5 %   MCV 78.1  78.0 - 100.0 fL   MCH 25.4 (*) 26.0 - 34.0 pg   MCHC 32.5  30.0 - 36.0 g/dL   RDW 62.1 (*) 30.8 - 65.7 %   Platelets 185  150 - 400 K/uL  PROTIME-INR     Status: None   Collection Time    10/15/13  1:41 PM      Result Value Range   Prothrombin Time 12.2  11.6 - 15.2 seconds   INR 0.92  0.00 - 1.49     HEENT: normal Cardio: RRR and no murmur Resp: decreased BS R base and mildly labored mouth breathing,  GI: BS positive and non distended Extremity:  Pulses positive and No Edema Skin:   Intact Neuro: Lethargic, Anxious and Dysarthric Musc/Skel:  Other hamstring spasm Gen NAD Flexor withdrawal  spasticity left lower extremity,  Motor strength is 0/5 left upper extremity trace hip extension left lower extremity Right upper extremity 4+/5 in the deltoid, bicep, tricep, grip right lower extremity 4/5 in the hip flexor knee extensor ankle dorsiflexor plantar flexor  Patient with likely field cut on the left side.  She follows simple commands long latency  Assessment/Plan: 1. Functional deficits secondary to large R MCA infarct with L HP which require 3+ hours per day of interdisciplinary therapy in a comprehensive inpatient rehab setting. Physiatrist is providing close team supervision and 24 hour management of active medical problems listed below. Physiatrist and rehab team continue to assess barriers to discharge/monitor patient progress toward functional and medical goals.  FIM: FIM - Bathing Bathing Steps Patient Completed: Chest;Abdomen;Left Arm;Left upper leg;Right upper leg Bathing: 1: Two helpers  FIM - Upper Body Dressing/Undressing Upper body dressing/undressing: 1: Total-Patient completed less than 25% of tasks FIM - Lower Body Dressing/Undressing Lower body dressing/undressing: 1: Two helpers  FIM - Toileting Toileting: 0: Activity did not occur  FIM - Diplomatic Services operational officer Devices: Human resources officer Transfers: 0-Activity did not occur  FIM - Banker Devices: Arm rests (w/c <> mat table) Bed/Chair Transfer: 1: Two helpers  FIM - Locomotion: Wheelchair Distance: 150 Locomotion: Wheelchair: 1: Total Assistance/staff pushes wheelchair (Pt<25%) FIM - Locomotion: Ambulation Locomotion: Ambulation Assistive Devices: Other (comment) (+2 A (3 musketeers)) Ambulation/Gait Assistance: 1: +2 Total assist Locomotion: Ambulation: 0: Activity did not occur  Comprehension Comprehension Mode: Auditory Comprehension: 3-Understands basic 50 - 74% of the time/requires cueing 25 - 50%  of the  time  Expression Expression Mode: Verbal Expression: 2-Expresses basic 25 - 49% of the time/requires cueing 50 - 75% of the time. Uses single words/gestures.  Social Interaction Social Interaction Mode: Asleep Social Interaction: 3-Interacts appropriately 50 - 74% of the time - May be physically or verbally inappropriate.  Problem Solving Problem Solving Mode: Asleep Problem Solving: 2-Solves basic 25 - 49% of the time - needs direction more than half the time to initiate, plan or complete simple activities  Memory Memory Mode: Asleep Memory: 2-Recognizes or recalls 25 - 49% of the time/requires cueing 51 - 75% of the time  Medical Problem List and Plan:  1. DVT Prophylaxis/Anticoagulation: Pharmaceutical: Lovenox  2. Pain Management: prn tylenol  3. Mood: continue Prozac for mood stabilization. Patient with cognitive deficits with poor/minimal insight---LCSW to follow for evaluation.  4. Neuropsych: This patient is not capable of making decisions  on her own behalf.  5. HTN: Monitor with bid checks and adjust medications as indicated.  6. Recent LGIB with ABLA: Monitor H/H and stool guaiacs as now on ASA . Hgb ok at 9.4 7. A fib: Will monitor heart rate with bid checks. Continue betapace.  8. Dysphagia:NPO, trials of D1 with SLP,IR to place PEG today 9. Hypokalemia: Dilutional--added supplement.  10. Spasticity: PRAFO left lower extremity.  -ROM with therapy  -Baclofen increase to 20mg  qhs and MRx1 11.  Hx of Grade 1 diastolic CHF, responded to Lasix.  Resume demadex, monitor for azotemia 12.  Mental status changes related to UTI and CHF, improved after tx of UTI,Off levaquin  11/20. Monitor recurrence Limit neurosedating meds LOS (Days) 14 A FACE TO FACE EVALUATION WAS PERFORMED  [place]oyah Ramone E 10/16/2013, 8:19 AM

## 2013-10-16 NOTE — Progress Notes (Signed)
Physical Therapy Session Note  Patient Details  Name: Tina Chambers MRN: [id number] Date of Birth: 11-21-1927  Today's Date: 10/16/2013 Time: [phone] and 1035 (full time [phone] cotreat with SLP)-1057 and [phone] Time Calculation (min): 22 min and 27 min and 28 min  Short Term Goals: Week 2:  PT Short Term Goal 1 (Week 2): Pt will perform rolling in bed to L and R with mod A and max verbal cues to initiate PT Short Term Goal 2 (Week 2): Pt will perform supine <> sit with max A and max verbal cues to initiate PT Short Term Goal 3 (Week 2): Pt will perform bed <> w/c with max A and max verbal cues to initiate PT Short Term Goal 4 (Week 2): Pt will perform gait x 20' in controlled environment with +2 A for WB through L, alertness, sustained attention with max cues PT Short Term Goal 5 (Week 2): Pt will transition to regular manual w/c and maintain trunk and head control for 2 hours OOB with half lap tray for LUE support  Skilled Therapeutic Interventions/Progress Updates:   Pt just returned from PEG placement and initially very lethargic.  Once pt began moving around pt became more alert and conversational.  Assisted pt with rolling with total A to don PJ pants and side > sit with total A to EOB to don shirt. Cued pt to lean to R elbow for balance and to minimize pushing to L while donning shirt over LUE.  Once clothes and shoes donned with +2 A pt transferred bed > w/c with +2 squat pivot.  Performed face washing and grooming tasks at sink with mirror in midline for facilitation of head control and scanning to midline to find face.  Pt continues to perseverate on washing mouth and R side of face even with total cues to attend to L side of face.  At end of grooming pt became lethargic and sleepy again.  In gym set pt up in standing frame with SLP present to focus on attention and selection of specific object in standing.  Upon standing in frame with +2 assist pt noted to have incontinent  urine episode.  Returned to sitting, to room and to bed with +2 total A.  Assisted nurse tech with rolling pt, hygiene and positioning pt in bed once pt clean and with clean brief on.  Pt fell asleep immediately in bed.    PM session: Pt asleep in bed and PEG hooked up to wall suction.  Performed L sided neck and bilat LE stretching/PROM to increased ROM and decrease tone for positioning, ROM to mimimize risk for contractures, pressure ulcers and discomfort.  At end of session pt head and L side positioned to more elongated position in midline.     Therapy Documentation Precautions:  Precautions Precautions: Fall Precaution Comments: L neglect; NG tube; maintain HOB >40 deg Restrictions Weight Bearing Restrictions: No Vital Signs: Therapy Vitals Pulse Rate: 87 Resp: 17 BP: 141/64 mmHg Patient Position, if appropriate: Lying Oxygen Therapy SpO2: 100 % O2 Device: Nasal cannula O2 Flow Rate (L/min): 2 L/min Pulse Oximetry Type: Continuous End Tidal CO2 (EtCO2): 29 Pain: Pain Assessment Pain Assessment: No/denies pain Pain Score: 0-No pain  See FIM for current functional status  Therapy/Group: Individual Therapy and Co-Treatment  Edman Circle [facility] 10/16/2013, 11:27 AM

## 2013-10-16 NOTE — Progress Notes (Signed)
Orthopedic Tech Progress Note Patient Details:  Tina Chambers [birth year] [id number]  Ortho Devices Type of Ortho Device: Abdominal binder Ortho Device/Splint Interventions: Casandra Doffing 10/16/2013, 3:40 PM

## 2013-10-16 NOTE — Procedures (Signed)
Successful 27fr gastrostomy  No comp Stable Full use tomorrow

## 2013-10-16 NOTE — Progress Notes (Signed)
Social Work Patient ID: Tina Chambers, female   DOB: Apr 23, 1927, 79 y.o.   MRN: [id number] Met with daughter when here and have spoken with Tina Chambers who request infomration due to pt and family want this facility. They report they will have a bed for pt and will work toward medically stability.

## 2013-10-16 NOTE — Progress Notes (Signed)
Occupational Therapy Weekly Progress Note  Patient Details  Name: Tina Chambers MRN: [id number] Date of Birth: [birth year]  Today's Date: 10/16/2013 Time: 1310-1355  Time Calculation (min): 45 min  Patient has met 0 of 5 short term goals.  Pt is making slow progress towards goals. Pt has displayed increased attention to the left with Mod-Max multimodal cueing and increased participation during bathing and dressing with increased wait time for processing and initiation continuing to require Mod -Max multimodal cues for sequencing and attention to task. Pt with increased static sitting balance requiring Min-Mod assist with moments of static sitting with close supervision for approx 30 sec. Pt currently Total assist +2 for transfers due to active pushing to left with RUE and decreased participation with transfer, and is currentlyTotal assist of one caregiver, sometimes +2, for bed mobility.   Patient continues to demonstrate the following deficits: Lt hemiparesis, flaccid LUE, impaired attention, arousal, sequencing, and initiation, Lt inattention, decreased sitting balance, pushing and therefore will continue to benefit from skilled OT intervention to enhance overall performance with BADL and Reduce care partner burden.  Patient not progressing toward long term goals.  See goal revision..  Plan of care revisions: downgraded to max assist overall with mod cues for attention and sequencing.  OT Short Term Goals Week 2:  OT Short Term Goal 1 (Week 2): Patient will perform bed mobility of roling left <>right with maximal assistance in order to assist caregiver in BADL OT Short Term Goal 1 - Progress (Week 2): Progressing toward goal OT Short Term Goal 2 (Week 2): Pt will scan to Lt environment with min verbal cues to obtain necessary items to complete self-care tasks OT Short Term Goal 2 - Progress (Week 2): Revised due to lack of progress OT Short Term Goal 3 (Week 2): Pt will complete BSC  transfer with max assist of 1 caregiver with use of slide board PRN OT Short Term Goal 3 - Progress (Week 2): Progressing toward goal OT Short Term Goal 4 (Week 2): Pt will complete UB dressing with mod assist OT Short Term Goal 4 - Progress (Week 2): Progressing toward goal OT Short Term Goal 5 (Week 2): Pt will complete bathing with max assist of 1 caregiver OT Short Term Goal 5 - Progress (Week 2): Progressing toward goal Week 3:  OT Short Term Goal 1 (Week 3): Patient will perform bed mobility of roling left <>right with maximal assistance in order to assist caregiver in BADL OT Short Term Goal 2 (Week 3): Pt will scan to Lt environment with mod verbal cues to obtain necessary items to complete self-care tasks OT Short Term Goal 3 (Week 3): Pt will complete BSC transfer with max assist of 1 caregiver with use of slide board PRN OT Short Term Goal 4 (Week 3): Pt will complete UB dressing with mod assist with mod verbal cues for sequencing OT Short Term Goal 5 (Week 3): Pt will complete bathing with max assist of 1 caregiver  Skilled Therapeutic Interventions/Progress Updates: Pt seen for 1:1 OT with focus on bed mobility, supine > sit, static sitting balance, attention to task, and scanning to Lt.  Pt in bed upon arrival, incontinent of urine requiring total assist for bed mobility and hygiene.  Pt with increased attention to task and ability to follow direction to allow bed mobility with only 1 person assist this session.  Engaged in task seated EOB with mod-max assist to maintain seated balance, as bed less firm and static than  therapy mat.  Encouraged pt to scan to Lt of midline to describe picture, starting at midline and progressing to Lt visual field.  Pt more on task this session, with appropriate responses to questions regarding pictures and fewer tangential discussions.  Pt requesting to sit in w/c at end of session, squat pivot transfer with +2 due to pushing with RUE and decreased trunk  control and participation with transfer. Pt's daughter arrived at end of session and visiting with pt in family room.  Therapy Documentation Precautions:  Precautions Precautions: Fall Precaution Comments: L neglect; NG tube; maintain HOB >40 deg Restrictions Weight Bearing Restrictions: No  See FIM for current functional status  Kersti Scavone, [facility] 10/16/2013, 9:53 AM

## 2013-10-17 ENCOUNTER — Inpatient Hospital Stay (HOSPITAL_COMMUNITY): Payer: Medicare Other | Admitting: Physical Therapy

## 2013-10-17 LAB — GLUCOSE, CAPILLARY: Glucose-Capillary: 119 mg/dL — ABNORMAL HIGH (ref 70–99)

## 2013-10-17 NOTE — Progress Notes (Signed)
Subjective/Complaints: 79 y.o. female with history of HTN, recent pelvic fracture, A Fib--off blood thinners due to GIB (most recent ~ 2 weeks ago), CHF who was admitted on 09/29/13 past being found with facial droop, right gaze preference and left sided weakness. CCT without evidence of bleed and patient underwent cerebral angiogram with complete revascularization of occluded R-MCA with IA tpa and thrombectomy with Solitaire device. Follow up CCT with evolving acute infarct R-MCA territory involving parietal and temporal lobe. 2D echo with EF 50-55% and no wall abnormality, grade 1 diastolic dysfunction. Carotid dopplers with 40-59% R-ICA stenosis and 1- 39% L-ICA stenosis. Extubated on 11/05 and started on D 1, nectar thick liquids due to significant pocketing with spillage as well as decrease LOC. Patient continues with left sided weakness, left inattention with right gaze preference, poor short term memory Poor awareness, discussed PEG with pt Oriented to person but not place  Subjective: Patient has minimal back pain but no other specific complaints.  Objective: Vital Signs: Blood pressure 105/66, pulse 84, temperature 98.5 F (36.9 C), temperature source Oral, resp. rate 18, height 5\' 5"  (1.651 m), weight 141 lb 12.1 oz (64.3 kg), SpO2 100.00%.  Elderly female in no acute distress. Chest clear to auscultation. Cardiac exam S1-S2 are regular. Abdominal exam active bowel sounds, soft. Extremities no edema.  Assessment/Plan: 1. Functional deficits secondary to large R MCA infarct with L HP   Medical Problem List and Plan:  1. DVT Prophylaxis/Anticoagulation: Pharmaceutical: Lovenox  2. Pain Management: prn tylenol  3. Mood: continue Prozac for mood stabilization. Patient with cognitive deficits with poor/minimal insight---LCSW to follow for evaluation.  4. Neuropsych: This patient is not capable of making decisions on her own behalf.  5. HTN: Monitor with bid checks and adjust medications as  indicated.  6. Recent LGIB with ABLA: Monitor H/H and stool guaiacs as now on ASA . Hgb ok at 9.4 7. A fib: Will monitor heart rate with bid checks. Continue betapace.  8. Dysphagia:NPO, trials of D1 with SLP,IR to place PEG today 9. Hypokalemia: resolved Basic Metabolic Panel:    Component Value Date/Time   NA 132* 10/13/2013 0645   K 4.2 10/13/2013 0645   CL 95* 10/13/2013 0645   CO2 28 10/13/2013 0645   BUN 25* 10/13/2013 0645   CREATININE 0.67 10/13/2013 0645   GLUCOSE 142* 10/13/2013 0645   CALCIUM 9.2 10/13/2013 0645    10. Spasticity: PRAFO left lower extremity.   11.  Hx of Grade 1 diastolic CHF, responded to Lasix.  Resume demadex, monitor for azotemia 12.  Mental status changes related to UTI and CHF, improved after tx of UTI,Off levaquin  11/20. Monitor recurrence Limit neurosedating meds LOS (Days) 15 A FACE TO FACE EVALUATION WAS PERFORMED  Shellby Schlink HENRY 10/17/2013, 9:51 AM

## 2013-10-17 NOTE — Progress Notes (Signed)
Patient's tube feed started at 30/hour. Medications given without incident. Patient tolerated. Will advance per orders. Sherlyn Lees, RN

## 2013-10-17 NOTE — Progress Notes (Signed)
Physical Therapy Session Note  Patient Details  Name: Tina Chambers MRN: [id number] Date of Birth: Apr 22, 1927  Today's Date: 10/17/2013 Time: 4098-1191 Time Calculation (min): 45 min  Short Term Goals: Week 2:  PT Short Term Goal 1 (Week 2): Pt will perform rolling in bed to L and R with mod A and max verbal cues to initiate PT Short Term Goal 2 (Week 2): Pt will perform supine <> sit with max A and max verbal cues to initiate PT Short Term Goal 3 (Week 2): Pt will perform bed <> w/c with max A and max verbal cues to initiate PT Short Term Goal 4 (Week 2): Pt will perform gait x 20' in controlled environment with +2 A for WB through L, alertness, sustained attention with max cues PT Short Term Goal 5 (Week 2): Pt will transition to regular manual w/c and maintain trunk and head control for 2 hours OOB with half lap tray for LUE support  Skilled Therapeutic Interventions/Progress Updates:    Pt received supine in bed; daughter present. Pt asleep but easily awakened. Agrees to session after moderate coaxing. HOB maintained at >40 degrees throughout session. Supine<>sit with +2A. Static sitting EOB x7 minutes total with min/mod A; pt able to self-correct posterolateral (to L side) trunk displacement with therapist mirroring pt posture, verbal/visual feedback for irection of midline. Dynamic sitting activity involving RUE reaching for object in R/L visual field x6 minutes total with min-max A for postural stability secondary to increased posterior/L-sided trunk lean with increased fatigue, with RUE reaching across midline. Sit<>stand x3 trials with +2A for R HHA and manual facilitation at L axilla, inferior to R ribcage for upright posture. Once standing, pt exhibits limited bilat hip extension, cervicothoracic flexion, downward gaze. Daughter provided verbal cueing for upright posture. Longest (final) standing trial was ~20 seconds prior to pt request for seated rest. Per pt request to sit in  w/c, performed squat-pivot from bed>w/c with +2A, multimodal cueing for anterior weight shift. Therapist departed pt room with pt in tilt-in-space w/c with quick release belt on, daughter present, and all needs within reach.  Therapy Documentation Precautions:  Precautions Precautions: Fall Precaution Comments: L neglect; NG tube; maintain HOB >40 deg Restrictions Weight Bearing Restrictions: No Vital Signs: Therapy Vitals Temp: 98.4 F (36.9 C) Temp src: Oral Pulse Rate: 88 Resp: 19 BP: 115/75 mmHg Patient Position, if appropriate: Sitting Oxygen Therapy SpO2: 98 % O2 Device: None (Room air) Pain: Pain Assessment Pain Assessment: FLACC Pain Score: 2  Pain Type: Acute pain Pain Location: Back Pain Orientation: Lower Pain Descriptors / Indicators: Aching Pain Onset: Gradual Patients Stated Pain Goal: 0 Pain Intervention(s): Ambulation/increased activity;Repositioned Multiple Pain Sites: No  See FIM for current functional status  Therapy/Group: Individual Therapy  Calvert Cantor 10/17/2013, 6:24 PM

## 2013-10-17 NOTE — Plan of Care (Signed)
Problem: RH SKIN INTEGRITY Goal: RH STG SKIN FREE OF INFECTION/BREAKDOWN Patient will remain free from new skin breakdown with mod asst and assess skin q shift  Outcome: Progressing Small abrasion to left knee with minimal drainage. Has foam dressing in place.

## 2013-10-17 NOTE — Progress Notes (Signed)
Subjective: Pt without acute changes; mildly tender at G tube site  Objective: Vital signs in last 24 hours: Temp:  [97.5 F (36.4 C)-98.5 F (36.9 C)] 98.5 F (36.9 C) (11/22 0600) Pulse Rate:  [84-102] 84 (11/22 0600) Resp:  [18] 18 (11/22 0600) BP: (105-123)/(66-78) 105/66 mmHg (11/22 0600) SpO2:  [96 %-100 %] 100 % (11/22 0600) Weight:  [141 lb 12.1 oz (64.3 kg)] 141 lb 12.1 oz (64.3 kg) (11/22 0500) Last BM Date: 10/16/13  Intake/Output from previous day: 11/21 0701 - 11/22 0700 In: -  Out: 1250 [Urine:1000; Drains:250] Intake/Output this shift:    Gastrostomy tube intact, dressing dry, abd soft,ND, insertion site mildly tender  Lab Results:   Recent Labs  10/15/13 0520  WBC 8.1  HGB 9.4*  HCT 28.9*  PLT 185   BMET No results found for this basename: NA, K, CL, CO2, GLUCOSE, BUN, CREATININE, CALCIUM,  in the last 72 hours PT/INR  Recent Labs  10/15/13 1341  LABPROT 12.2  INR 0.92   ABG No results found for this basename: PHART, PCO2, PO2, HCO3,  in the last 72 hours  Studies/Results: Ir Gastrostomy Tube Mod Sed  10/16/2013   CLINICAL DATA:  Dysphagia, atrial fibrillation, CHF, right MCA stroke  EXAM: Fluoroscopic 20 French pull-through gastrostomy  Date:  11/21/201411/21/2014 9:15 AM  Radiologist:  Judie Petit. Ruel Favors, MD  Guidance:  Fluoroscopic  MEDICATIONS AND MEDICAL HISTORY: 2 g Ancefadministered within 1 hour of the procedure,2 mg Versed, 12.5 mcg fentanyl  ANESTHESIA/SEDATION: 15 min  CONTRAST:  20mL OMNIPAQUE IOHEXOL 300 MG/ML  SOLN  FLUOROSCOPY TIME:  2 min 18 seconds  PROCEDURE: Informed consent was obtained from the patient following explanation of the procedure, risks, benefits and alternatives. The patient understands, agrees and consents for the procedure. All questions were addressed. A time out was performed.  Maximal barrier sterile technique utilized including caps, mask, sterile gowns, sterile gloves, large sterile drape, hand hygiene, and  betadine prep.  The left upper quadrant was sterilely prepped and draped. An oral gastric catheter was inserted into the stomach under fluoroscopy. The existing nasogastric feeding tube was removed. Air was injected into the stomach for insufflation and visualization under fluoroscopy. The air distended stomach was confirmed beneath the anterior abdominal wall in the frontal and lateral projections. Under sterile conditions and local anesthesia, a 17 gauge trocar needle was utilized to access the stomach percutaneously beneath the left subcostal margin. Needle position was confirmed within the stomach under biplane fluoroscopy. Contrast injection confirmed position also. A single T tack was deployed for gastropexy. Over an Amplatz guide wire, a 9-French sheath was inserted into the stomach. A snare device was utilized to capture the oral gastric catheter. The snare device was pulled retrograde from the stomach up the esophagus and out the oropharynx. The 20-French pull-through gastrostomy was connected to the snare device and pulled antegrade through the oropharynx down the esophagus into the stomach and then through the percutaneous tract external to the patient. The gastrostomy was assembled externally. Contrast injection confirms position in the stomach. Images were obtained for documentation. The patient tolerated procedure well. No immediate complication.  COMPLICATIONS: No immediate  IMPRESSION: Fluoroscopic insertion of a 20-French "pull-through" gastrostomy.   Electronically Signed   By: Ruel Favors M.D.   On: 10/16/2013 09:23   Dg Abd Portable 1v  10/16/2013   CLINICAL DATA:  Evaluate barium prior to GT placement.  EXAM: PORTABLE ABDOMEN - 1 VIEW  COMPARISON:  10/14/2013  FINDINGS: Feeding tube  tip proximal jejunum.  Barium throughout the colon.  Diverticula noted.  The possibility of free intraperitoneal air cannot be assessed on a supine view.  Scoliosis lumbar spine convex to the right with  degenerative changes.  IMPRESSION: Feeding tube tip proximal jejunum.  Barium throughout the colon.   Electronically Signed   By: Bridgett Larsson M.D.   On: 10/16/2013 07:35    Anti-infectives: Anti-infectives   Start     Dose/Rate Route Frequency Ordered Stop   10/16/13 0815  ceFAZolin (ANCEF) IVPB 2 g/50 mL premix    Comments:  Send to IR 17 please   2 g 100 mL/hr over 30 Minutes Intravenous  Once 10/16/13 0807 10/16/13 0944   10/16/13 0600  ceFAZolin (ANCEF) IVPB 2 g/50 mL premix    Comments:  Hang ON CALL to xray 11/21   2 g 100 mL/hr over 30 Minutes Intravenous  Once 10/15/13 0946 10/16/13 0655   10/09/13 1200  levofloxacin (LEVAQUIN) tablet 250 mg     250 mg Oral Daily 10/09/13 0939 10/14/13 1056   10/08/13 2000  ciprofloxacin (CIPRO) tablet 250 mg  Status:  Discontinued     250 mg Oral 2 times daily 10/08/13 1531 10/09/13 0939   10/06/13 0645  cephALEXin (KEFLEX) capsule 250 mg  Status:  Discontinued     250 mg Oral 3 times per day 10/06/13 0634 10/08/13 1531      Assessment/Plan: s/p perc gastrostomy tube 11/21; ok to use tube for feeds; other plans as per primary  LOS: 15 days    Katisha Shimizu,D [facility] 10/17/2013

## 2013-10-17 NOTE — Plan of Care (Signed)
Problem: RH BLADDER ELIMINATION Goal: RH STG MANAGE BLADDER WITH ASSISTANCE STG Manage Bladder With mod Assistance  Outcome: Not Progressing In and out cath  Problem: RH SKIN INTEGRITY Goal: RH STG SKIN FREE OF INFECTION/BREAKDOWN Patient will remain free from new skin breakdown with mod asst and assess skin q shift  Outcome: Not Progressing Skin tear to left knee, wound to right side of groin Goal: RH STG MAINTAIN SKIN INTEGRITY WITH ASSISTANCE STG Maintain Skin Integrity With mod Assistance.  Outcome: Not Progressing Skin tear to left knee, wound to right side of groin

## 2013-10-18 ENCOUNTER — Inpatient Hospital Stay (HOSPITAL_COMMUNITY): Payer: Medicare Other | Admitting: *Deleted

## 2013-10-18 ENCOUNTER — Inpatient Hospital Stay (HOSPITAL_COMMUNITY): Payer: Medicare Other | Admitting: Occupational Therapy

## 2013-10-18 MED ORDER — SORBITOL 70 % SOLN: 30.0000 mL | Freq: Every day | Status: DC | PRN

## 2013-10-18 NOTE — Progress Notes (Signed)
Occupational Therapy Note  Patient Details  Name: Tina Chambers MRN: [id number] Date of Birth: [birth year] Today's Date: 10/18/2013  Time: 1630-1700 (30 min) Pain: left leg pain; had cortisone shot last week.    Individual session  Focus of treatment was bed mobility, transfers, neuro-muscular reeducation, postural control, positioning. Pt.lying in bed upon OT arrival.  Provided rolling exercises and positioning LUE and LLE. Pt. Positioned on right side at end of session.  Humberto Seals 10/18/2013, 5:18 PM

## 2013-10-18 NOTE — Progress Notes (Signed)
Subjective/Complaints: 79 y.o. female with history of HTN, recent pelvic fracture, A Fib--off blood thinners due to GIB (most recent ~ 2 weeks ago), CHF who was admitted on 09/29/13 past being found with facial droop, right gaze preference and left sided weakness. CCT without evidence of bleed and patient underwent cerebral angiogram with complete revascularization of occluded R-MCA with IA tpa and thrombectomy with Solitaire device. Follow up CCT with evolving acute infarct R-MCA territory involving parietal and temporal lobe. 2D echo with EF 50-55% and no wall abnormality, grade 1 diastolic dysfunction. Carotid dopplers with 40-59% R-ICA stenosis and 1- 39% L-ICA stenosis. Extubated on 11/05 and started on D 1, nectar thick liquids due to significant pocketing with spillage as well as decrease LOC. Patient continues with left sided weakness, left inattention with right gaze preference, poor short term memory Poor awareness, discussed PEG with pt Oriented to person but not place  Subjective: feels well Nurse reports no bm in 2-3 days  Objective: Vital Signs: Blood pressure 135/63, pulse 85, temperature 98.3 F (36.8 C), temperature source Oral, resp. rate 18, height 5\' 5"  (1.651 m), weight 141 lb 5 oz (64.1 kg), SpO2 98.00%.  Elderly female in no acute distress. Chest clear to auscultation. Cardiac exam S1-S2 are regular. Abdominal exam active bowel sounds, soft. Extremities no edema.  Assessment/Plan: 1. Functional deficits secondary to large R MCA infarct with L HP   Medical Problem List and Plan:  1. DVT Prophylaxis/Anticoagulation: Pharmaceutical: Lovenox  2. Pain Management: prn tylenol  3. Mood: continue Prozac for mood stabilization. Patient with cognitive deficits with poor/minimal insight---LCSW to follow for evaluation.  4. Neuropsych: This patient is not capable of making decisions on her own behalf.  5. HTN: Monitor with bid checks and adjust medications as indicated.  6. Recent  LGIB with ABLA: Monitor H/H and stool guaiacs as now on ASA . Hgb ok at 9.4 7. A fib: Will monitor heart rate with bid checks. Continue betapace.  8. Dysphagia:NPO, trials of D1 with SLP,IR to place PEG today 9. Hypokalemia: resolved Basic Metabolic Panel:    Component Value Date/Time   NA 132* 10/13/2013 0645   K 4.2 10/13/2013 0645   CL 95* 10/13/2013 0645   CO2 28 10/13/2013 0645   BUN 25* 10/13/2013 0645   CREATININE 0.67 10/13/2013 0645   GLUCOSE 142* 10/13/2013 0645   CALCIUM 9.2 10/13/2013 0645    10. Spasticity: PRAFO left lower extremity.   11.  Hx of Grade 1 diastolic CHF, responded to Lasix.  Resume demadex, monitor for azotemia 12.  Mental status changes related to UTI and CHF, improved after tx of UTI,Off levaquin  11/20. Monitor recurrence Limit neurosedating meds LOS (Days) 16 A FACE TO FACE EVALUATION WAS PERFORMED  Leya Paige HENRY 10/18/2013, 10:04 AM

## 2013-10-18 NOTE — Progress Notes (Signed)
Occupational Therapy Session Note  Patient Details  Name: Tina Chambers MRN: [id number] Date of Birth: 19-Mar-1927  Today's Date: 10/18/2013 Time:10:15-11:15 Time Calculation (min):60 min   Skilled Therapeutic Interventions/Progress Updates: Skilled Therapeutic Interventions/Progress Updates: Patient seen by this clinician and rehab tech for ADL with focus on postural control and static standing from w/c at sink as well as focused and sustained attention to task. Patient required Total to Max A x 2 to stand for about 30 secs x 2 for pericleansing, donning brief and pulling up pants. Patient distracted and stated, "Look at that picture of my boys behind you..." and "I am going to go over to the closet across the room and get out my black pants" (referred to the pants after she had already donned a clean pair and when asked if she needed to change her pants or was cold, she replied, "No" to both questions.   Patient perseverated during self care today and required redirection and focus.  Patient leaned left in chair even with left lap tray on and chair slightly tilted back. This clinician also placed a pillow on her left lateral side to increase safer and more neutral postural w/c positioniong.   Upon beginning of session, patient noticed her feeding tube was leaking and disconnected. RN came in to check, place the feed on hold and stated she would reconnect after OT session.   Patient w/c belt fastened and left at nurses station for safety at end of session.    Therapy Documentation Precautions:  Precautions Precautions: Fall Precaution Comments: L neglect; NG tube; maintain HOB >40 deg Restrictions Weight Bearing Restrictions: No  Pain:denied   See FIM for current functional status  Therapy/Group: Individual Therapy  Bud Face [facility] 10/18/2013, 4:17 PM

## 2013-10-18 NOTE — Progress Notes (Signed)
Physical Therapy Session Note  Patient Details  Name: Tina Chambers MRN: [id number] Date of Birth: 1927-11-02  Today's Date: 10/18/2013   Skilled Therapeutic Interventions/Progress Updates:  Session I 60 min [phone]) Patient in bed supine , nursing care is being provided at the beginning of the session. Patient is awake and is able to verbalize that she is not in pain, confused.  Rolling in bed with max A to don pants, max A to perform supine to sit. EOB sitting balance training with max manual cues provided to facilitate proper sitting posture ,patient able to initiate rightning about 25 % of time.Strong L side lean. Transfer to w/c ,squat pivot max A. Sitting tolerance in w/c with lateral support to reduce L side lean. Attempted standing in a frame, patient unable to lean forward to place lift belt under, multiple forward leans initiated and patient able to actively participate and lean forward with pulling on a frame with R UE.  In II bars 3 x sit to stand max A of 2, patient not able to stand longer than 20 seconds. Patient returned to room sitting in a w/c  With safety belt on ,nursing notified.   Session II: (1345-1425) 40 min   Patient resting in bed, does not want to transfer to w/c, complains of pain 6/10 in the pelvic are(nursing notified)and patient repositioned which assisted in pain relief. Activities in supne which included rolling R and L both with max A, supine to sit with max A, sitting EOb 2 x 7 min with mod A due to strong L side lean.  Initiated quad sets in sitting patient was not able to maintain focus to perform activity. Patient returned to supine with alarm on ,all needs within reach.  Therapy Documentation Precautions:  Precautions Precautions: Fall Precaution Comments: L neglect; NG tube; maintain HOB >40 deg Restrictions Weight Bearing Restrictions: No Vital Signs: Therapy Vitals Temp: 98.3 F (36.8 C) Temp src: Oral Pulse Rate: 85 Resp:  18 BP: 135/63 mmHg Oxygen Therapy SpO2: 98 %  See FIM for current functional status  Therapy/Group: Individual Therapy  Dorna Mai 10/18/2013, 8:50 AM

## 2013-10-19 ENCOUNTER — Inpatient Hospital Stay (HOSPITAL_COMMUNITY): Payer: Medicare Other | Admitting: Physical Therapy

## 2013-10-19 ENCOUNTER — Inpatient Hospital Stay (HOSPITAL_COMMUNITY): Payer: Medicare Other | Admitting: Speech Pathology

## 2013-10-19 ENCOUNTER — Inpatient Hospital Stay (HOSPITAL_COMMUNITY): Payer: Medicare Other | Admitting: Occupational Therapy

## 2013-10-19 DIAGNOSIS — G8110 Spastic hemiplegia affecting unspecified side: Secondary | ICD-10-CM

## 2013-10-19 DIAGNOSIS — R209 Unspecified disturbances of skin sensation: Secondary | ICD-10-CM

## 2013-10-19 DIAGNOSIS — I69998 Other sequelae following unspecified cerebrovascular disease: Secondary | ICD-10-CM

## 2013-10-19 DIAGNOSIS — I6330 Cerebral infarction due to thrombosis of unspecified cerebral artery: Secondary | ICD-10-CM

## 2013-10-19 DIAGNOSIS — I69991 Dysphagia following unspecified cerebrovascular disease: Secondary | ICD-10-CM

## 2013-10-19 LAB — CBC
HCT: 31.9 % — ABNORMAL LOW (ref 36.0–46.0)
Hemoglobin: 9.9 g/dL — ABNORMAL LOW (ref 12.0–15.0)
MCHC: 31 g/dL (ref 30.0–36.0)
Platelets: 179 10*3/uL (ref 150–400)
WBC: 8.2 10*3/uL (ref 4.0–10.5)

## 2013-10-19 MED ORDER — JEVITY 1.2 CAL PO LIQD
320.0000 mL | Freq: Once | ORAL | Status: AC
  Administered 2013-10-19: 150 mL
  Filled 2013-10-19: qty 474

## 2013-10-19 MED ORDER — JEVITY 1.2 CAL PO LIQD
320.0000 mL | Freq: Four times a day (QID) | ORAL | Status: DC
  Administered 2013-10-20: 200 mL
  Administered 2013-10-20 – 2013-10-21 (×5): 320 mL
  Filled 2013-10-19 (×11): qty 474

## 2013-10-19 MED ORDER — TRAMADOL HCL 50 MG PO TABS
25.0000 mg | ORAL_TABLET | Freq: Four times a day (QID) | ORAL | Status: DC | PRN
  Administered 2013-10-19 – 2013-10-21 (×5): 25 mg via ORAL
  Filled 2013-10-19 (×5): qty 1

## 2013-10-19 NOTE — Progress Notes (Signed)
Speech Language Pathology Daily Session Note & Weekly Progress Note  Patient Details  Name: Tina Chambers MRN: [id number] Date of Birth: 1927/03/09  Today's Date: 10/19/2013 Time: 4098-1191 Time Calculation (min): 25 min  Short Term Goals: Week 2: SLP Short Term Goal 1 (Week 2): Patient will sustain attention to trials of Dys.1 and nectar-thick liquids via spoon for 5 to 6 bites with Mod multi-modal cueing. SLP Short Term Goal 2 (Week 2): Patient will utilize speech intelligibility strategies with Min verbal cueing. SLP Short Term Goal 3 (Week 2): Patient will find item at midline during structured task with Mod mulit-modal cueing. SLP Short Term Goal 4 (Week 2): Patient will demonstrate topic maintence during an SLP initiated topic for 2 turns with Max multi-modal cueing. SLP Short Term Goal 5 (Week 2): Patient will label 1 physical deficit post CVA with Max questioning and verbal cues.  Skilled Therapeutic Interventions: Patient participated in skilled co-treatment with OT.  SLP skilled intervention focused on cognition and dysphagia goals. SLP facilitated session with trails of Dys.1 and nectar-thick liquids via spoon; patient demonstrated wet vocal quality x1 due to distraction in the environment and required Min verbal cues to produce volitional throat clear and swallow.  Patient sustained attention for 3-5 bites and required Max verbal cues to initiate self-feeding during 6oz of puree and 2oz of liquid trials.  OT's expertise optimized positioning to facilitate arousal and alertness during p.o. trials, although it has decreased since last session possibly due to break in consistency over the weekend.  Continue with current plan of care.   FIM:  Comprehension Comprehension Mode: Auditory Comprehension: 2-Understands basic 25 - 49% of the time/requires cueing 51 - 75% of the time Expression Expression Mode: Verbal Expression: 2-Expresses basic 25 - 49% of the time/requires cueing  50 - 75% of the time. Uses single words/gestures. Social Interaction Social Interaction: 2-Interacts appropriately 25 - 49% of time - Needs frequent redirection. Problem Solving Problem Solving: 1-Solves basic less than 25% of the time - needs direction nearly all the time or does not effectively solve problems and may need a restraint for safety Memory Memory: 1-Recognizes or recalls less than 25% of the time/requires cueing greater than 75% of the time FIM - Eating Eating Activity: 5: Needs verbal cues/supervision;5: Set-up assist for open containers;1: Helper performs IV, parenteral, or tube feeding  Pain Pain Assessment Pain Assessment: No/denies pain Pain Score: 0-No pain  Therapy/Group: Individual Therapy   Speech Language Pathology Weekly Progress Note  Patient Details  Name: Tina Chambers MRN: [id number] Date of Birth: 06-30-1927  Short Term Goals: Week 2: SLP Short Term Goal 1 (Week 2): Patient will sustain attention to trials of Dys.1 and nectar-thick liquids via spoon for 5 to 6 bites with Mod multi-modal cueing. SLP Short Term Goal 1 - Progress (Week 2): Met SLP Short Term Goal 2 (Week 2): Patient will utilize speech intelligibility strategies with Min verbal cueing. SLP Short Term Goal 2 - Progress (Week 2): Met SLP Short Term Goal 3 (Week 2): Patient will find item at midline during structured task with Mod mulit-modal cueing. SLP Short Term Goal 3 - Progress (Week 2): Met SLP Short Term Goal 4 (Week 2): Patient will demonstrate topic maintence during an SLP initiated topic for 2 turns with Max multi-modal cueing. SLP Short Term Goal 4 - Progress (Week 2): Progressing toward goal SLP Short Term Goal 5 (Week 2): Patient will label 1 physical deficit post CVA with Max questioning and verbal cues.  SLP Short Term Goal 5 - Progress (Week 2): Progressing toward goal Week 3: SLP Short Term Goal 1 (Week 3): Patient will participate in trials of Dys.1 and nectar-thick  liquids with Mod verbal and tactile cues. SLP Short Term Goal 2 (Week 3): Patient will sustain attention for 5 minutes with Mod multi-modal cueing. SLP Short Term Goal 3 (Week 3): Patient will label 1 physical deficit post CVA with Mod questioning and verbal cues. SLP Short Term Goal 4 (Week 3): Patient will find item on left during structured task with Mod mulit-modal cueing.  Weekly Progress Updates: Patient met 3 out of 5 short term goals this reporting period.  Patient made gains in sustaining attention during feeding trials and continued use of intelligibility strategies.  Patient also demonstrated progress in scanning to midline and to the left during both structured tasks and independently when self-stimulated.  Patient's overall arousal and alertness continue to improve, however they are still significantly impacted p.o. intake and cognitive abilities.  It is requested that co-treatments with PT/OT continue as positioning continues to optimize patient's overall performance and safety with p.o. intake and abilities during cognitive tasks.  It is recommended that patient continue to receive skilled SLP services to continue to focus on cognitive and dysphagia goals prior to discharge to maximize safety, increase functional independence, and reduce burden of care prior to discharge.   SLP Intensity: Minumum of 1-2 x/day, 30 to 90 minutes SLP Frequency: 3 out of 7 days SLP Duration/Estimated Length of Stay: Discharge 11/28 SLP Treatment/Interventions: Cognitive remediation/compensation;Speech/Language facilitation;Therapeutic Activities;Therapeutic Exercise;Dysphagia/aspiration precaution training;Oral motor exercises;Patient/family education SLP Eating/Swallowing Anticipated Outcome(s): Mod A with trials; PEG tube as NPO due to fatigue SLP Communication Anticipated Outcome(s): Min A SLP Cognition Anticipated Outcome(s): Mod A   Desma Mcgregor E 10/19/2013, 3:36 PM

## 2013-10-19 NOTE — Progress Notes (Signed)
Orthopedic Tech Progress Note Patient Details:  Tina Chambers [birth year] [id number]  Ortho Devices Type of Ortho Device: Abdominal binder Ortho Device/Splint Location: replacement abdominal binder Ortho Device/Splint Interventions: Ordered   Shawnie Pons 10/19/2013, 11:06 AM

## 2013-10-19 NOTE — Progress Notes (Signed)
Occupational Therapy Session Note  Patient Details  Name: Tina Chambers MRN: [id number] Date of Birth: 1927/06/30  Today's Date: 10/19/2013 Time: 0730-0828 and 1033-1050 (Co treat with [id number]) Time Calculation: 58 min and   Short Term Goals: Week 3:  OT Short Term Goal 1 (Week 3): Patient will perform bed mobility of roling left <>right with maximal assistance in order to assist caregiver in BADL OT Short Term Goal 2 (Week 3): Pt will scan to Lt environment with mod verbal cues to obtain necessary items to complete self-care tasks OT Short Term Goal 3 (Week 3): Pt will complete BSC transfer with max assist of 1 caregiver with use of slide board PRN OT Short Term Goal 4 (Week 3): Pt will complete UB dressing with mod assist with mod verbal cues for sequencing OT Short Term Goal 5 (Week 3): Pt will complete bathing with max assist of 1 caregiver  Skilled Therapeutic Interventions/Progress Updates:  1.) Pt seen for ADL retraining with focus on increased participation with bathing and dressing with fewer verbal cues. Pt in bed upon arrival with c/o pain in left leg. Doctor notified of pain. Pt required Total assist supine >sit EOB this session and +2 Total assist from EOB >w/c with verbal and tactile cues for anterior weight shift for increased participation during transfers.Pt able to wash face and chest with one verbal cue. Pt washed left arm and upper legs with Mod multimodal cues. Encouraged questioning cues throughout session to promote recall and sequencing of steps. Pt completed putting her head through her shirt using her RUE this session. Required + 2 Total assist to pull up pants at sit > stand level. Pt completed grooming at sink in w/c. Therapist wheeled Pt to nurses station.     2.) Pt seen for skilled co-treat with SLP for facilitation of sitting balance and increased arousal during feeding trials of dys 1 textures and nectar thick liquids. Pt seated in w/c in RN  station upon arrival, transferred w/c > therapy mat with total assist of 2 with max cues for RUE placement to decrease pushing. Pt fluctuated from Max -Min assist for sitting balance with therapist behind pt to promote midline orientation and upright sitting balance. Pt had moments of static sitting balance with close supervision lasting approx 30 sec after weight bearing through right elbow to promote forward weight shift and decrease pushing.    Therapy Documentation Precautions:  Precautions Precautions: Fall Precaution Comments: L neglect; NG tube; maintain HOB >40 deg Restrictions Weight Bearing Restrictions: No   Vital Signs: Therapy Vitals: 145/77 BP Pain: C/o of pain in left hip  See FIM for current functional status  Therapy/Group: Individual and Co-treat   Donna Christen 10/19/2013, 12:01 PM

## 2013-10-19 NOTE — Progress Notes (Signed)
Patient ID: Tina Chambers, female   DOB: 17-Jun-1927, 79 y.o.   MRN: [id number] Subjective/Complaints: 79 y.o. female with history of HTN, recent pelvic fracture, A Fib--off blood thinners due to GIB (most recent ~ 2 weeks ago), CHF who was admitted on 09/29/13 past being found with facial droop, right gaze preference and left sided weakness. CCT without evidence of bleed and patient underwent cerebral angiogram with complete revascularization of occluded R-MCA with IA tpa and thrombectomy with Solitaire device. Follow up CCT with evolving acute infarct R-MCA territory involving parietal and temporal lobe. 2D echo with EF 50-55% and no wall abnormality, grade 1 diastolic dysfunction. Carotid dopplers with 40-59% R-ICA stenosis and 1- 39% L-ICA stenosis. Extubated on 11/05 and started on D 1, nectar thick liquids due to significant pocketing with spillage as well as decrease LOC. Patient continues with left sided weakness, left inattention with right gaze preference, poor short term memory  Oriented to person but not place Review of Systems - unable to do ROS secondary to mental status  Objective: Vital Signs: Blood pressure 122/60, pulse 93, temperature 98.5 F (36.9 C), temperature source Oral, resp. rate 19, height 5\' 5"  (1.651 m), weight 62.6 kg (138 lb 0.1 oz), SpO2 99.00%. No results found. Results for orders placed during the hospital encounter of 10/02/13 (from the past 72 hour(s))  GLUCOSE, CAPILLARY     Status: Abnormal   Collection Time    10/17/13  5:02 PM      Result Value Range   Glucose-Capillary 119 (*) 70 - 99 mg/dL  CBC     Status: Abnormal   Collection Time    10/19/13  6:42 AM      Result Value Range   WBC 8.2  4.0 - 10.5 K/uL   RBC 3.97  3.87 - 5.11 MIL/uL   Hemoglobin 9.9 (*) 12.0 - 15.0 g/dL   HCT 69.6 (*) 29.5 - 28.4 %   MCV 80.4  78.0 - 100.0 fL   MCH 24.9 (*) 26.0 - 34.0 pg   MCHC 31.0  30.0 - 36.0 g/dL   RDW 13.2 (*) 44.0 - 10.2 %   Platelets 179  150 - 400  K/uL     HEENT: normal Cardio: RRR and no murmur Resp: decreased BS R base and mildly labored mouth breathing,  GI: BS positive and non distended Extremity:  Pulses positive and No Edema Skin:   Intact Neuro: Lethargic, Anxious and Dysarthric Musc/Skel:  Other hamstring spasm Gen NAD Flexor withdrawal spasticity left lower extremity, improved with pt in sitting Motor strength is 0/5 left upper extremity trace hip extension left lower extremity Right upper extremity 4+/5 in the deltoid, bicep, tricep, grip right lower extremity 4/5 in the hip flexor knee extensor ankle dorsiflexor plantar flexor  Patient with likely field cut on the left side.  She follows simple commands long latency  Assessment/Plan: 1. Functional deficits secondary to large R MCA infarct with L HP which require 3+ hours per day of interdisciplinary therapy in a comprehensive inpatient rehab setting. Physiatrist is providing close team supervision and 24 hour management of active medical problems listed below. Physiatrist and rehab team continue to assess barriers to discharge/monitor patient progress toward functional and medical goals.  FIM: FIM - Bathing Bathing Steps Patient Completed: Chest;Abdomen;Left Arm;Left upper leg;Right upper leg Bathing: 3: Mod-Patient completes 5-7 94f 10 parts or 50-74%  FIM - Upper Body Dressing/Undressing Upper body dressing/undressing steps patient completed: Put head through opening of pull over shirt/dress Upper  body dressing/undressing: 2: Max-Patient completed 25-49% of tasks FIM - Lower Body Dressing/Undressing Lower body dressing/undressing: 1: Two helpers  FIM - Toileting Toileting: 0: Activity did not occur  FIM - Diplomatic Services operational officer Devices: Human resources officer Transfers: 0-Activity did not occur  FIM - Banker Devices: Arm rests Bed/Chair Transfer: 1: Chair or W/C > Bed: Total A  (helper does all/Pt. < 25%);1: Bed > Chair or W/C: Total A (helper does all/Pt. < 25%)  FIM - Locomotion: Wheelchair Distance: 150 Locomotion: Wheelchair: 1: Total Assistance/staff pushes wheelchair (Pt<25%) FIM - Locomotion: Ambulation Locomotion: Ambulation Assistive Devices: Other (comment) (+2 A (3 musketeers)) Ambulation/Gait Assistance: 1: +2 Total assist Locomotion: Ambulation: 0: Activity did not occur  Comprehension Comprehension Mode: Auditory Comprehension: 2-Understands basic 25 - 49% of the time/requires cueing 51 - 75% of the time  Expression Expression Mode: Verbal Expression: 2-Expresses basic 25 - 49% of the time/requires cueing 50 - 75% of the time. Uses single words/gestures.  Social Interaction Social Interaction Mode: Asleep Social Interaction: 2-Interacts appropriately 25 - 49% of time - Needs frequent redirection.  Problem Solving Problem Solving Mode: Asleep Problem Solving: 1-Solves basic less than 25% of the time - needs direction nearly all the time or does not effectively solve problems and may need a restraint for safety  Memory Memory Mode: Asleep Memory: 1-Recognizes or recalls less than 25% of the time/requires cueing greater than 75% of the time  Medical Problem List and Plan:  1. DVT Prophylaxis/Anticoagulation: Pharmaceutical: Lovenox  2. Pain Management: prn tylenol  3. Mood: continue Prozac for mood stabilization. Patient with cognitive deficits with poor/minimal insight---LCSW to follow for evaluation.  4. Neuropsych: This patient is not capable of making decisions on her own behalf.  5. HTN: Monitor with bid checks and adjust medications as indicated.  6. Recent LGIB with ABLA: Monitor H/H and stool guaiacs as now on ASA .  7. A fib: Will monitor heart rate with bid checks. Continue betapace.  8. Dysphagia:NPO, trials of D1 with SLP,PEG 9. Hypokalemia: Dilutional--added supplement.  10. Spasticity: PRAFO left lower extremity.  -ROM with  therapy  -Baclofen increase to 20mg  qhs and MRx1 11.  Hx of Grade 1 diastolic CHF, responded to Lasix.  Resume demadex, monitor for azotemia 12.  Mental status changes related to UTI and CHF, improved after tx of UTI,Off levaquin  11/20. Monitor recurrence Limit neurosedating meds LOS (Days) 17 A FACE TO FACE EVALUATION WAS PERFORMED  KIRSTEINS,ANDREW E 10/19/2013, 7:53 PM

## 2013-10-19 NOTE — Progress Notes (Signed)
Physical Therapy Weekly Progress Note  Patient Details  Name: Tina Chambers MRN: [id number] Date of Birth: 03-08-1927  Today's Date: 10/19/2013 Time: 0906-1000 and 4098-1191 Time Calculation (min): 54 min and 35 min  Patient continues to make slow progress and has met 0 of 5 short term goals.  Pt arousal and attention to task and L side, initiation and postural control continues to improve as demonstrated during PT and SLP co-treatments during self feeding but pt continues to require total A for all bed mobility, basic transfers and w/c mobility in tilt in space w/c secondary to continued fatigue and impaired sustained postural control.    Patient continues to demonstrate the following deficits: L hemiplegia with hypertonicity L UE, trunk and LE, L neglect, impaired initiation, attention, memory and motor control, timing/sequencing and grading of movement, motor perseveration, impaired trunk control, balance, gait, activity tolerance/endurance, pain in low back/pelvis with WB and therefore will continue to benefit from skilled PT intervention to enhance overall performance with activity tolerance, balance, postural control, ability to compensate for deficits, functional use of  left upper extremity and left lower extremity, attention, awareness and coordination.  Patient not progressing toward long term goals.  See goal revision..  Plan of care revisions: Goals downgraded secondary to pt NHP and slow progress.  PT Short Term Goals Week 2:  PT Short Term Goal 1 (Week 2): Pt will perform rolling in bed to L and R with mod A and max verbal cues to initiate PT Short Term Goal 1 - Progress (Week 2): Progressing toward goal PT Short Term Goal 2 (Week 2): Pt will perform supine <> sit with max A and max verbal cues to initiate PT Short Term Goal 2 - Progress (Week 2): Progressing toward goal PT Short Term Goal 3 (Week 2): Pt will perform bed <> w/c with max A and max verbal cues to initiate PT  Short Term Goal 3 - Progress (Week 2): Progressing toward goal PT Short Term Goal 4 (Week 2): Pt will perform gait x 20' in controlled environment with +2 A for WB through L, alertness, sustained attention with max cues PT Short Term Goal 4 - Progress (Week 2): Not met PT Short Term Goal 5 (Week 2): Pt will transition to regular manual w/c and maintain trunk and head control for 2 hours OOB with half lap tray for LUE support PT Short Term Goal 5 - Progress (Week 2): Not met Week 3:  PT Short Term Goal 1 (Week 3): = LTG  Skilled Therapeutic Interventions/Progress Updates:   Pt already up in w/c at nurses station now with PEG running on continuous.  Transported to gym total A in tilt in space.  Performed transfer to mat with total A squat pivot with second person assisting by holding RUE to facilitate anterior lean and prevent grasping, pushing with RUE on w/c.  Pt was able to initiate anterior lean from back of w/c 25%.  Seated on mat performed NMR; see below for details.  Changed to standing in standing frame x 2 reps to focus on elongation and extension activation during reaching for pegs and completing peg board activity filling 10 holes at a time scanning R to L for empty spaces.  Pt cued to lift RUE completely off of standing frame table to facilitate increased head and trunk control and upright posture.  Pt continued to require max-total verbal cues for sequencing, attention to task and attention to pegs and empty spaces.  Pt also required max  A to maintain upright head and trunk posture from behind pt.  Pt internally focused on pain in low back/pelvis.  Pt given seated rest break on mat.  Returned to w/c total A squat pivot and back to RN station.    PM session: Pt has been up in w/c all day.  Performed playing card task in sitting with focus on visual scanning in various directions to locate and retrieve card with hearts (two cards being held up) and place it in a pile on the L.  Activity used to  work on pt sustained attention to task and correct card, attention to L, initiation of task and initiation of anterior lean during reaching for card.  As pt fatigued she required extra time and verbal cues to scan, find and retrieve correct card.  Pt requested to stay in chair while visiting with family.  Therapy Documentation Precautions:  Precautions Precautions: Fall Precaution Comments: L neglect; NG tube; maintain HOB >40 deg Restrictions Weight Bearing Restrictions: No Vital Signs: Therapy Vitals BP: 121/55 mmHg Patient Position, if appropriate: Sitting Pain:  No c/o pain Other Treatments: Treatments Neuromuscular Facilitation: Upper Extremity;Lower Extremity;Right;Left;Activity to increase timing and sequencing;Activity to increase grading;Activity to increase lateral weight shifting;Activity to increase anterior-posterior weight shifting in sitting with focus on reaching forward, to the R and towards the ground to reach for and stack blocks on the floor to to facilitate self correction of posterior and L LOB.    See FIM for current functional status  Therapy/Group: Individual Therapy  Edman Circle [facility] 10/19/2013, 10:24 AM

## 2013-10-19 NOTE — Progress Notes (Signed)
I have reviewed and agree with the attached treatment note.  Fritzi Scripter, OTR/L 

## 2013-10-19 NOTE — Progress Notes (Signed)
The skilled treatment and weekly progress notes have been reviewed and SLP is in agreement. Levis Nazir, M.A., CCC-SLP [phone]  

## 2013-10-20 ENCOUNTER — Inpatient Hospital Stay (HOSPITAL_COMMUNITY): Payer: Medicare Other | Admitting: Occupational Therapy

## 2013-10-20 ENCOUNTER — Inpatient Hospital Stay (HOSPITAL_COMMUNITY): Payer: Medicare Other | Admitting: Physical Therapy

## 2013-10-20 LAB — BASIC METABOLIC PANEL
BUN: 20 mg/dL (ref 6–23)
Chloride: 98 mEq/L (ref 96–112)
GFR calc Af Amer: >90 mL/min (ref >90)
GFR calc non Af Amer: 80 mL/min — ABNORMAL LOW (ref >90)
Glucose, Bld: 146 mg/dL — ABNORMAL HIGH (ref 70–99)
Potassium: 4.3 mEq/L (ref 3.5–5.1)
Sodium: 136 mEq/L (ref 135–145)

## 2013-10-20 NOTE — Progress Notes (Addendum)
Patient ID: Tina Chambers, female   DOB: 02-13-1927, 79 y.o.   MRN: [id number] Subjective/Complaints: 79 y.o. female with history of HTN, recent pelvic fracture, A Fib--off blood thinners due to GIB (most recent ~ 2 weeks ago), CHF who was admitted on 09/29/13 past being found with facial droop, right gaze preference and left sided weakness. CCT without evidence of bleed and patient underwent cerebral angiogram with complete revascularization of occluded R-MCA with IA tpa and thrombectomy with Solitaire device. Follow up CCT with evolving acute infarct R-MCA territory involving parietal and temporal lobe. 2D echo with EF 50-55% and no wall abnormality, grade 1 diastolic dysfunction. Carotid dopplers with 40-59% R-ICA stenosis and 1- 39% L-ICA stenosis. Extubated on 11/05 and started on D 1, nectar thick liquids due to significant pocketing with spillage as well as decrease LOC. Patient continues with left sided weakness, left inattention with right gaze preference, poor short term memory  Oriented to person but not place,  tolerating bolusTF 200 mL 4 times a day with goal of QID No hip pain or leg pain today Review of Systems - unable to do ROS secondary to mental status  Objective: Vital Signs: Blood pressure 152/74, pulse 87, temperature 97.6 F (36.4 C), temperature source Oral, resp. rate 18, height 5\' 5"  (1.651 m), weight 64.4 kg (141 lb 15.6 oz), SpO2 100.00%. No results found. Results for orders placed during the hospital encounter of 10/02/13 (from the past 72 hour(s))  GLUCOSE, CAPILLARY     Status: Abnormal   Collection Time    10/17/13  5:02 PM      Result Value Range   Glucose-Capillary 119 (*) 70 - 99 mg/dL  CBC     Status: Abnormal   Collection Time    10/19/13  6:42 AM      Result Value Range   WBC 8.2  4.0 - 10.5 K/uL   RBC 3.97  3.87 - 5.11 MIL/uL   Hemoglobin 9.9 (*) 12.0 - 15.0 g/dL   HCT 47.8 (*) 29.5 - 62.1 %   MCV 80.4  78.0 - 100.0 fL   MCH 24.9 (*) 26.0  - 34.0 pg   MCHC 31.0  30.0 - 36.0 g/dL   RDW 30.8 (*) 65.7 - 84.6 %   Platelets 179  150 - 400 K/uL  BASIC METABOLIC PANEL     Status: Abnormal   Collection Time    10/20/13  5:20 AM      Result Value Range   Sodium 136  135 - 145 mEq/L   Potassium 4.3  3.5 - 5.1 mEq/L   Chloride 98  96 - 112 mEq/L   CO2 30  19 - 32 mEq/L   Glucose, Bld 146 (*) 70 - 99 mg/dL   BUN 20  6 - 23 mg/dL   Creatinine, Ser 9.62  0.50 - 1.10 mg/dL   Calcium 9.3  8.4 - 95.2 mg/dL   GFR calc non Af Amer 80 (*) >90 mL/min   GFR calc Af Amer >90  >90 mL/min   Comment: (NOTE)     The eGFR has been calculated using the CKD EPI equation.     This calculation has not been validated in all clinical situations.     eGFR's persistently <90 mL/min signify possible Chronic Kidney     Disease.     HEENT: normal Cardio: RRR and no murmur Resp: decreased BS R base and mildly labored mouth breathing,  GI: BS positive and non distended Extremity:  Pulses positive and No Edema Skin:   Intact Neuro: Lethargic, Anxious and Dysarthric Musc/Skel:  Other hamstring spasm, mild to moderate Gen NAD Flexor withdrawal spasticity left lower extremity, improved with pt in supine  Motor strength is 0/5 left upper extremity trace hip extension left lower extremity Right upper extremity 4+/5 in the deltoid, bicep, tricep, grip right lower extremity 4/5 in the hip flexor knee extensor ankle dorsiflexor plantar flexor  Patient with field cut on the left side.  She follows simple commands long latency  Assessment/Plan: 1. Functional deficits secondary to large R MCA infarct with L HP which require 3+ hours per day of interdisciplinary therapy in a comprehensive inpatient rehab setting. Physiatrist is providing close team supervision and 24 hour management of active medical problems listed below. Physiatrist and rehab team continue to assess barriers to discharge/monitor patient progress toward functional and medical  goals.  Medically stable for discharge to SNF  FIM: FIM - Bathing Bathing Steps Patient Completed: Chest;Abdomen;Left Arm;Left upper leg;Right upper leg Bathing: 3: Mod-Patient completes 5-7 30f 10 parts or 50-74%  FIM - Upper Body Dressing/Undressing Upper body dressing/undressing steps patient completed: Put head through opening of pull over shirt/dress Upper body dressing/undressing: 2: Max-Patient completed 25-49% of tasks FIM - Lower Body Dressing/Undressing Lower body dressing/undressing: 1: Two helpers  FIM - Toileting Toileting: 0: Activity did not occur  FIM - Diplomatic Services operational officer Devices: Human resources officer Transfers: 0-Activity did not occur  FIM - Banker Devices: Arm rests Bed/Chair Transfer: 1: Two helpers;1: Bed > Chair or W/C: Total A (helper does all/Pt. < 25%)  FIM - Locomotion: Wheelchair Distance: 150 Locomotion: Wheelchair: 1: Total Assistance/staff pushes wheelchair (Pt<25%) FIM - Locomotion: Ambulation Locomotion: Ambulation Assistive Devices: Other (comment) (+2 A (3 musketeers)) Ambulation/Gait Assistance: 1: +2 Total assist Locomotion: Ambulation: 0: Activity did not occur  Comprehension Comprehension Mode: Auditory Comprehension: 2-Understands basic 25 - 49% of the time/requires cueing 51 - 75% of the time  Expression Expression Mode: Verbal Expression: 1-Expresses basis less than 25% of the time/requires cueing greater than 75% of the time.  Social Interaction Social Interaction Mode: Asleep Social Interaction: 2-Interacts appropriately 25 - 49% of time - Needs frequent redirection.  Problem Solving Problem Solving Mode: Asleep Problem Solving: 1-Solves basic less than 25% of the time - needs direction nearly all the time or does not effectively solve problems and may need a restraint for safety  Memory Memory Mode: Asleep Memory: 1-Recognizes or recalls less  than 25% of the time/requires cueing greater than 75% of the time  Medical Problem List and Plan:  1. DVT Prophylaxis/Anticoagulation: Pharmaceutical: Lovenox  2. Pain Management: prn tylenol  3. Mood: continue Prozac for mood stabilization. Patient with cognitive deficits with poor/minimal insight---LCSW to follow for evaluation.  4. Neuropsych: This patient is not capable of making decisions on her own behalf.  5. HTN: Monitor with bid checks and adjust medications as indicated.  6. Recent LGIB with ABLA: Monitor H/H and stool guaiacs as now on ASA .  7. A fib: Will monitor heart rate with bid checks. Continue betapace.  8. Dysphagia:NPO, trials of D1 with SLP,PEG 9. Hypokalemia: Dilutional--added supplement.  10. Spasticity: PRAFO left lower extremity.  -ROM with therapy  -Baclofen increase to 20mg  qhs and MRx1 11.  Hx of Grade 1 diastolic CHF, responded to Lasix.  Resume demadex, monitor for azotemia 12.  Mental status  Limit neurosedating meds LOS (Days) 18 A FACE TO FACE  EVALUATION WAS PERFORMED  Jevan Gaunt E 10/20/2013, 10:01 AM

## 2013-10-20 NOTE — Discharge Summary (Signed)
Physician Discharge Summary  Patient ID: Tina Chambers MRN: [id number] DOB/AGE: 07-25-1927 79 y.o.  Admit date: 10/02/2013 Discharge date: 10/21/2013  Discharge Diagnoses:  Principal Problem:   Acute right MCA stroke Active Problems:   Atrial fibrillation   Essential hypertension, benign   Discharged Condition:  Stable  Significant Diagnostic Studies: Dg Chest 2 View  10/05/2013   CLINICAL DATA:  The kidney a and decreased right sided low breath sounds  EXAM: CHEST  2 VIEW  COMPARISON:  September 29, 2013.  FINDINGS: The patient has undergone interval extubation of the trachea and of the esophagus with placement of a Dobbhoff type feeding tube. The radiodense bulb lies in the region of the gastric cardia with a loop of the catheter positioned more distally. The cardiac silhouette is enlarged. The pulmonary vascularity is prominent centrally. The pulmonary interstitial markings are mildly increased today as compared to the previous study. There is no significant pleural effusion. The observed portions of the bony thorax exhibit no acute abnormalities.  IMPRESSION: The findings suggest low-grade CHF. There is no focal pneumonia.   Electronically Signed   By: David  [place]   On: 10/05/2013 14:27    Dg Abd 1 View  10/14/2013   CLINICAL DATA:  Feeding tube placement.  EXAM: ABDOMEN - 1 VIEW  COMPARISON:  09/30/2013.  FINDINGS: A Dobbhoff feeding to was unclogged and advanced under fluoroscopic guidance into the region of the ligament of Treitz.  IMPRESSION: Dobbhoff feeding tube tip is at the junction of the 4th portion of the duodenum and the jejunum.   Electronically Signed   By: Loralie Champagne M.D.   On: 10/14/2013 10:03   Ct Head Wo Contrast  10/05/2013   CLINICAL DATA:  86-year- female with increased weakness and fatigue. Recent right MCA infarct. Initial encounter.  EXAM: CT HEAD WITHOUT CONTRAST  TECHNIQUE: Contiguous axial images were obtained from the base of the skull through  the vertex without intravenous contrast.  COMPARISON:  Brain MRI 10/01/2013 and earlier.  FINDINGS: Right side nasoenteric tube. Right side hearing aid. Visualized paranasal sinuses and mastoids are clear. *INSERT* bones No acute orbit or scalp soft tissue findings.  Hypodensity in the right MCA territory corresponding to the area of diffusion abnormality on 10/01/2013. Mild associated mass effect, primarily at the basal ganglia, stable. Mild effacement of the right lateral ventricle. Basilar cisterns remain patent. No midline shift. No acute intracranial hemorrhage identified.  Superimposed chronic right full amic lacunar infarct. Stable and normal gray-white matter differentiation in the left hemisphere and posterior fossa.  IMPRESSION: 1. Expected evolution of right MCA infarct. Stable mild mass effect and no associated hemorrhage.  2. No new intracranial abnormality identified.   Electronically Signed   By: Augusto Gamble M.D.   On: 10/05/2013 16:13   Labs:  Basic Metabolic Panel:  Recent Labs Lab 10/20/13 0520  NA 136  K 4.3  CL 98  CO2 30  GLUCOSE 146*  BUN 20  CREATININE 0.63  CALCIUM 9.3    CBC:  Recent Labs Lab 10/15/13 0520 10/19/13 0642  WBC 8.1 8.2  HGB 9.4* 9.9*  HCT 28.9* 31.9*  MCV 78.1 80.4  PLT 185 179    CBG:  Recent Labs Lab 10/17/13 1702  GLUCAP 119*    Brief HPI:   Tina Chambers is a 79 y.o. female with history of HTN, recent pelvic fracture, A Fib--off blood thinners due to GIB, CHF who was admitted on 09/29/13 past being found with facial droop,  right gaze preference and left sided weakness. CCT without evidence of bleed and patient underwent cerebral angiogram with complete revascularization of occluded R-MCA with IA tpa and thrombectomy with Solitaire device. Follow up CCT with evolving acute infarct R-MCA territory involving parietal and temporal lobe. Once extubated she was started on D 1, nectar thick liquids due to significant pocketing with  spillage as well as decrease LOC. Patient continues with left sided weakness, left inattention with right gaze preference, poor short term memory, poor attention to tasks. She was admitted to [facility] for progressive therapies.   Hospital Course: Tina Chambers was admitted to rehab 10/02/2013 for inpatient therapies to consist of PT, ST and OT at least three hours five days a week. Past admission physiatrist, therapy team and rehab RN have worked together to provide customized collaborative inpatient rehab. She was noted to be lethargic and confused  with poor/ unsafe po intake therefore she was made NPO and tube feeds initiated.  Formal swallowing evaluation done showed severe dysphagia and po trials are ongoing with ST. PEG was placed for nutritional support on 10/14/13 by Dr. Miles Costain. She is tolerating tube feeds   She was found to have E. Coli/Citrobacter UTI and was treated with Levaquin. CXR done past admission showed evidence of CHF and she responded to IV lasix. Demadex was resumed and follow up lytes show renal status to be stable. Repeat CT head was done on 10/05/13 showing evolution of stroke and she was cleared to start anticoagulation by neurology. Stool guaiacs have been monitored and one out of 11 specimens was positive. H/H has been monitored closely and has been stable overall. Baclofen was added to help with spasticity. She continues with severe cognitive deficits with intermittent confusion and hallucinations.   She continues to be limited by left inattention with left hemiplegia with flexor tone, left field cut as will as motor apraxia with pusher syndrome as well as significant cognitive deficits. She has had limited progress due to fore mentioned deficits and continues to require significant amount of assistance. Family has elected on progressive therapies at SNF and bed is available at [facility] today.     Rehab course: During patient's stay in rehab weekly team conferences were held  to monitor patient's progress, set goals and discuss barriers to discharge. Speech therapy has addressed  cognitive-linguistic goals as well as dysphagia. Trials of dysphagia 1, nectar liquids ongoing with ST only.  She continues to demonstrate wet voice and requires max cues to consume small sips as well as sustain attention to task. She is able to comprehend basic information at 25-50% accuracy and express needs with moderate to max assist.   Occupational therapy has focused on neuromuscular reeducation of UE as well as ADL tasks and endurance.  She continue to require mod- max assist to locate left body.  She continues to have poor attention and requires max multimodal cues for upper body  bathing.   Physical therapy has focused on bed mobility, transfers, and balance. She requires max assist for bed mobility as well as sitting balance. She is requires +2 total assist for transfers as well as ambulation of 15 feet with total assistance needed for weight shifting as well as advancing LLE.    Disposition:  Skilled Holiday representative.   Diet: NPO  Special Instructions: 1. Tube feeds 350 cc qid--at 7am, noon, 1700, 2200 2. Check BS prior to tube feeds and use SSI per protocol. 3. Oral care qid.  Future Appointments Provider Department Dept Phone   12/01/2013 11:00 AM Erick Colace, MD [facility] [phone]       Medication List    STOP taking these medications       diltiazem 180 MG 24 hr capsule  Commonly known as:  DILACOR XR     HYDROcodone-acetaminophen 5-325 MG per tablet  Commonly known as:  NORCO/VICODIN     omeprazole 20 MG capsule  Commonly known as:  PRILOSEC     OVER THE COUNTER MEDICATION     solifenacin 10 MG tablet  Commonly known as:  VESICARE     temazepam 15 MG capsule  Commonly known as:  RESTORIL      TAKE these medications       acetaminophen 325 MG tablet  Commonly known as:  TYLENOL  Take 1-2 tablets (325-650 mg total)  by mouth every 6 (six) hours as needed for mild pain.     amLODipine 5 MG tablet  Commonly known as:  NORVASC  Place 1 tablet (5 mg total) into feeding tube daily.     antiseptic oral rinse Liqd  15 mLs by Mouth Rinse route 2 (two) times daily.     apixaban 2.5 MG Tabs tablet  Commonly known as:  ELIQUIS  Place 1 tablet (2.5 mg total) into feeding tube 2 (two) times daily.     atorvastatin 10 MG tablet  Commonly known as:  LIPITOR  Place 1 tablet (10 mg total) into feeding tube daily.     baclofen 20 MG tablet  Commonly known as:  LIORESAL  Place 1 tablet (20 mg total) into feeding tube at bedtime as needed and may repeat dose one time if needed for muscle spasms.     feeding supplement (JEVITY 1.2 CAL) Liqd  Place 320 mLs into feeding tube 4 (four) times daily.     ferrous sulfate 325 (65 FE) MG tablet  Take 325 mg by mouth 2 (two) times daily with a meal.     FLUoxetine 10 MG capsule  Commonly known as:  PROZAC  Place 1 capsule (10 mg total) into feeding tube daily.     free water Soln  Place 200 mLs into feeding tube 4 (four) times daily -  with meals and at bedtime.     levothyroxine 75 MCG tablet  Commonly known as:  SYNTHROID, LEVOTHROID  Place 1 tablet (75 mcg total) into feeding tube daily before breakfast.     pantoprazole sodium 40 mg/20 mL Pack  Commonly known as:  PROTONIX  Place 20 mLs (40 mg total) into feeding tube daily.     potassium chloride 20 MEQ/15ML (10%) solution  Take 15 mLs (20 mEq total) by mouth 2 (two) times daily.     sotalol 80 MG tablet  Commonly known as:  BETAPACE  Place 1 tablet (80 mg total) into feeding tube 2 (two) times daily.     torsemide 10 MG tablet  Commonly known as:  DEMADEX  Place 0.5 tablets (5 mg total) into feeding tube daily.     traMADol 50 MG tablet Rx # 30 pills  Commonly known as:  ULTRAM  Place 0.5 tablets (25 mg total) into feeding tube 3 (three) times daily as needed for moderate pain.     traZODone 25  mg Tabs tablet  Commonly known as:  DESYREL  Take 0.5-1 tablets (25-50 mg total) by mouth at bedtime as needed for sleep.       Follow-up  Information   Follow up with Erick Colace, MD On 12/01/2013. (Be there at 10:30 am for 11 am appointment)    Specialty:  Physical Medicine and Rehabilitation   Contact information:   [address] [place] [address] [phone]       Schedule an appointment as soon as possible for a visit with Gates Rigg, MD. (for follow up in 4 weeks.)    Specialties:  Neurology, Radiology   Contact information:   [address] [place] [address] [phone]       Signed: Jacquelynn Cree 10/21/2013, 10:20 AM

## 2013-10-20 NOTE — Progress Notes (Signed)
I have reviewed and agree with the attached treatment note.  Cassidey Barrales, OTR/L 

## 2013-10-20 NOTE — Progress Notes (Signed)
NUTRITION FOLLOW-UP  DOCUMENTATION CODES Per approved criteria  -Not Applicable   INTERVENTION: Continue with advancements in bolus regimen to 320 mL q 6 hrs goal to provide 1536 kcal, 71g protein, and 1049 mL free water.   Recommend increasing goal bolus regimen to 350 mL (1.5 cans) q 6 hrs to provide 1680 kcal, 77g protein, and 1161 mL free water.  Continue 200 mL free water q 3 hrs. Diet texture and liquid consistency per SLP.  Encourage participation in SLP therapy for PO intake as able.  RD to continue to follow nutrition care plan.  NUTRITION DIAGNOSIS: Inadequate oral intake related to dysphagia as evidenced by meal completion <25%. Ongoing.  Goal: Pt to meet >/= 90% of their estimated nutrition needs - met.  Monitor:  Diet advancement, TF tolerance, weights, labs  ASSESSMENT: Pt admitted to [facility] 11/4 with R MCA, IV tPA given and thrombectomy. Pt extubated 11/5. Pt started on Dysphagia and has significant pocketing and spillage. Pt transferred to CIR on 11/7.   Pt not appropriate for POs at this time.  Continues with full feeds via PEG placed (11/21).  Pt is currently advancing bolus tube feeds via PEG.  Per RN, pt has reached 250 mL bolus volume.  Today pt also tolerated additional 100 mL medication slurry via PEG with lunch.    Residuals: 15 mL  Pt is preparing to transfer to NH tomorrow.   Height: Ht Readings from Last 1 Encounters:  10/02/13 5\' 5"  (1.651 m)    Weight: Wt Readings from Last 1 Encounters:  10/20/13 141 lb 15.6 oz (64.4 kg)  Admit wt 144 lb   BMI:  Body mass index is 23.63 kg/(m^2). Weight is WNL.  Estimated Nutritional Needs: Kcal: 1500-1700 Protein: 75-85 grams Fluid: > 1.5 L/day  Skin: sacrum wound  Diet Order:   PO trials - WITH SLP ONLY, NPO currently  EDUCATION NEEDS: -No education needs identified at this time   Intake/Output Summary (Last 24 hours) at 10/20/13 1218 Last data filed at 10/20/13 0700  Gross per 24 hour  Intake     900 ml  Output    350 ml  Net    550 ml    Last BM: 11/24  Labs:   Recent Labs Lab 10/20/13 0520  NA 136  K 4.3  CL 98  CO2 30  BUN 20  CREATININE 0.63  CALCIUM 9.3  GLUCOSE 146*    CBG (last 3)   Recent Labs  10/17/13 1702  GLUCAP 119*    Scheduled Meds: . amLODipine  5 mg Oral Daily  . antiseptic oral rinse  15 mL Mouth Rinse BID  . apixaban  2.5 mg Oral BID  . atorvastatin  10 mg Oral q1800  . feeding supplement (JEVITY 1.2 CAL)  320 mL Per Tube Q6H  . FLUoxetine  10 mg Oral Daily  . free water  200 mL Per Tube TID WC & HS  . levothyroxine  75 mcg Oral QAC breakfast  . pantoprazole sodium  40 mg Per Tube Daily  . potassium chloride  20 mEq Oral BID  . sotalol  80 mg Oral Q12H  . torsemide  5 mg Oral Daily    Continuous Infusions:    Loyce Dys, MS RD LDN Clinical Inpatient Dietitian Pager: [phone] Weekend/After hours pager: [phone]

## 2013-10-20 NOTE — Plan of Care (Signed)
Problem: RH KNOWLEDGE DEFICIT Goal: RH STG INCREASE KNOWLEDGE OF HYPERTENSION Patient and/or caregiver will verbalize understanding of signs and symptoms of hypertension and ways to manage hypertension with max asst  Outcome: Not Progressing Needs reinforcement

## 2013-10-20 NOTE — Progress Notes (Signed)
Physical Therapy Discharge Summary  Patient Details  Name: Tina Chambers MRN: [id number] Date of Birth: 1927/03/19  Today's Date: 10/20/2013 Time: 1430-1500 Time Calculation (min): 30 min  Patient has met 2 of 9 long term goals due to improved activity tolerance, improved balance, improved postural control, improved attention and improved awareness.  Patient to discharge at a wheelchair level max-total A for basic transfers and w/c mobility.   Patient's care partner unable to provide the necessary physical and cognitive assistance at discharge; pt to D/C to SNF for more long term rehabilitation to reach higher level of functional mobility independence prior to D/C home.  Reasons goals not met: Pt did not reach bed mobility goal of mod A, pt requires max A to assist with lifting and lowering of trunk and LE onto and off of bed; pt did not reach bed <> w/c goal of max A of one person, requires total A to maintain anterior lean and full pivot secondary to impaired initiation of LE extension to come to a squat position; pt did not reach sitting balance of consistent min A, pt able to maintain sitting balance of min A for 1-2 minutes before beginning L lateral and posterior LOB; pt did not meet ambulation goal, pt did perform gait x 15' with +2 assist but fatigued and was not able to reach a distance of 25'; pt did not meet w/c mobility goal secondary to pt continues to require support of tilt in space w/c and unable to transition to manual w/c to begin w/c mobility training; memory goal not met secondary to pt requiring max-total cues for memory of functional mobility tasks/sequences; sustained attention goal not met secondary to pt requiring max-total cues to maintain attention to L and to functional task every ~30 seconds.    Recommendation:  Patient will benefit from ongoing skilled PT services in skilled nursing facility setting to continue to advance safe functional mobility, address ongoing  impairments in L hemiplegia, impaired motor control, timing/sequencing and grading of movement, L neglect, impaired postural control, balance, gait, attention, memory, initiation, awareness, and minimize fall risk.  Equipment: Defer to next venue of care  Reasons for discharge: discharge from hospital  Patient/family agrees with progress made and goals achieved: Yes  PT Discharge Precautions/Restrictions Precautions Precautions: Fall Precaution Comments: Peg tube; maintain HOB >40 when feeding Restrictions Weight Bearing Restrictions: No Vital Signs Therapy Vitals Temp: 98.6 F (37 C) Temp src: Oral Pulse Rate: 81 Resp: 16 BP: 135/55 mmHg Patient Position, if appropriate: Lying Oxygen Therapy SpO2: 98 % Pain Pain Assessment Pain Assessment: No/denies pain Vision/Perception  Vision - History Baseline Vision: Wears glasses only for reading Vision - Assessment Alignment/Gaze Preference: Gaze right;Head tilt;Head turned;Chin down Perception Perception: Impaired Inattention/Neglect: Does not attend to left side of body (able to cross visual midline but unable to maintain for prolonged period of time) Praxis Praxis: Impaired Praxis Impairment Details: Initiation;Motor planning;Perseveration  Cognition Overall Cognitive Status: History of cognitive impairments - at baseline Arousal/Alertness: Awake/alert Orientation Level: Oriented to person;Disoriented to place Attention: Focused Memory: Impaired Awareness: Impaired Problem Solving: Impaired Safety/Judgment: Impaired Sensation Coordination Gross Motor Movements are Fluid and Coordinated: No Coordination and Movement Description: Impaired secondary to neglect and hemiplegia/tone Motor  Motor Motor: Hemiplegia;Abnormal tone;Motor apraxia;Abnormal postural alignment and control;Motor perseverations Motor - Discharge Observations: L hemiplegia and flexor tone LUE and LLE, sitting presents with pushing to L and posterior  and L lateral LOB, perseveration of RUE movements, impaired motor planning, sequencing and grading of movement  Mobility Bed Mobility Bed Mobility: Sit to Supine Sit to Supine: 2: Max assist Sit to Supine - Details (indicate cue type and reason): Pt required extra time, total verbal cues and tactile cues to bring RLE into the bed while therapist assisted with controlled lowering of trunk to supine position; also required assistance to bring LLE into the bed.  Performed bridging with max A to reposition hips in bed.   Transfers Transfers: Yes Lateral/Scoot Transfers: 1: +1 Total assist Lateral/Scoot Transfer Details (indicate cue type and reason): Required total A for squat pivot w/c > bed with total verbal and tactile cues to remove RUE from arm rest and for full anterior lean with over the back technique and total A to pivot fully to bed.   Locomotion  Ambulation Ambulation: Yes Ambulation/Gait Assistance: 1: +2 Total assist Ambulation Distance (Feet): 15 Feet Assistive device: 2 person hand held assist Ambulation/Gait Assistance Details: Required +2 assist with pt UE around therapists' shoulders to maintain upright trunk posture and total A for lateral and anterior weight shifting and verbal cues to initiate stepping with RLE and total A to advance and stablize LLE in stance.   Stairs / Additional Locomotion Stairs: No Corporate treasurer: No Wheelchair Assistance: 1: +1 Total assist  Trunk/Postural Assessment  Cervical Assessment Cervical Assessment: Exceptions to [facility] (maintains neck in flexion and R rotation, L lateral flexion) Thoracic Assessment Thoracic Assessment: Exceptions to WFL (Rests in flexion, thoracic kyphosis) Lumbar Assessment Lumbar Assessment: Exceptions to WFL (decreased lordosis) Postural Control Postural Control: Deficits on evaluation (delayed and impaired trunk and head control )  Balance Static Sitting Balance Static Sitting - Balance  Support: Feet supported;Right upper extremity supported;Left upper extremity supported Static Sitting - Level of Assistance: 5: Stand by assistance;2: Max assist Static Sitting - Comment/# of Minutes: Initially requires max A secondary to posterior and L lean/pushing but following flexion activities pt required as little as supervision for 2-3 minutes to maintain trunk in midline Dynamic Sitting Balance Dynamic Sitting - Balance Support: Feet supported;Left upper extremity supported;Right upper extremity supported Dynamic Sitting - Level of Assistance: 2: Max assist Dynamic Sitting - Balance Activities: Lateral lean/weight shifting;Forward lean/weight shifting;Reaching for objects Dynamic Sitting - Comments: Requires max A initially to remove RUE from mat and initiate anterior lean and reaching forward, down and the R; once pt able to attend to activity pt requires supervision-min A for dynamic balance but still requires extra time and total verbal cues to initiate reaching Static Standing Balance Static Standing - Balance Support: Right upper extremity supported;Left upper extremity supported Static Standing - Level of Assistance: 1: +2 Total assist;Other (comment) (standing frame) Dynamic Standing Balance Dynamic Standing - Balance Support: Bilateral upper extremity supported Dynamic Standing - Level of Assistance: 1: +2 Total assist Extremity Assessment  RUE Assessment RUE Assessment: Within Functional Limits LUE Assessment LUE Assessment: Exceptions to WFL LUE AROM (degrees) LUE Overall AROM Comments: Patient with flacid LUE, no active movement in shoulder, elbow, wrist, or fingers.  LUE PROM (degrees) LUE Overall PROM Comments: PROM is WFL LUE Strength LUE Overall Strength: Deficits (0/5)  RLE Strength: WFL LLE Strength LLE Overall Strength: Deficits LLE Overall Strength Comments: increased tone L LE, no votional movement noted.   See FIM for current functional status  Edman Circle [facility] 10/20/2013, 5:08 PM

## 2013-10-20 NOTE — Progress Notes (Signed)
Social Work Patient ID: Tina Chambers, female   DOB: 1927/04/16, 79 y.o.   MRN: [id number] Spoke with daughter who reports they would like to move pt tomorrow since no therapy on Thursday, as long as medically ready.  According to Pam-PA she is medically stable for transfer tomorrow. Will check with MD and work toward transfer tomorrow. Family to go to facility at 8;30 to complete paperwork.

## 2013-10-20 NOTE — Progress Notes (Signed)
Occupational Therapy Session Note  Patient Details  Name: Tina Chambers MRN: [id number] Date of Birth: Mar 25, 1927  Today's Date: 10/20/2013 Time: 1330-1400 (co-tx with SLP 1330-1430) Time Calculation (min): 30 min  Short Term Goals: Week 3:  OT Short Term Goal 1 (Week 3): Patient will perform bed mobility of roling left <>right with maximal assistance in order to assist caregiver in BADL OT Short Term Goal 2 (Week 3): Pt will scan to Lt environment with mod verbal cues to obtain necessary items to complete self-care tasks OT Short Term Goal 3 (Week 3): Pt will complete BSC transfer with max assist of 1 caregiver with use of slide board PRN OT Short Term Goal 4 (Week 3): Pt will complete UB dressing with mod assist with mod verbal cues for sequencing OT Short Term Goal 5 (Week 3): Pt will complete bathing with max assist of 1 caregiver  Skilled Therapeutic Interventions/Progress Updates:    Pt seen for skilled co-treat with SLP for facilitation of sitting balance and increased arousal during feeding trials of dys 1 textures and nectar thick liquids.  Pt seated in w/c in RN station upon arrival, transferred w/c > therapy mat with total assist of 2 with max cues for RUE placement to decrease pushing.  Pt required max assist, manual facilitation at trunk and hips to promote upright sitting balance.  Utilized weight bearing through Rt elbow and forward reaching to attempt to promote midline orientation and sitting balance.  Pt with decreased focused attention during session, requiring max multimodal cues to promote attention and initiation of participation in self-feeding with reaching for spoon from SLP placed on table at midline to promote upright sitting balance.  Therapy Documentation Precautions:  Precautions Precautions: Fall Precaution Comments: Peg tube; maintain HOB >40 when feeding Restrictions Weight Bearing Restrictions: No General:   Vital Signs: Therapy Vitals Temp: 98.6  F (37 C) Temp src: Oral Pulse Rate: 81 Resp: 16 BP: 135/55 mmHg Patient Position, if appropriate: Lying Oxygen Therapy SpO2: 98 % Pain:  Pt with no c/o pain this session.  See FIM for current functional status  Therapy/Group: Co-Treatment  [name], [facility] 10/20/2013, 4:08 PM

## 2013-10-20 NOTE — Progress Notes (Signed)
Physical Therapy Note  Patient Details  Name: Tina Chambers MRN: [id number] Date of Birth: 05-10-1927 Today's Date: 10/20/2013  Time: [phone] 45 minutes  1:1 No c/o pain.  Seated balance edge of mat with min A.  Card activity with matching and sorting.  Pt requires mod cuing for focused/sustained attention to task.  Requires hand over hand assist with R UE for reaching cards due to pt wanting to put R UE down to push.  Pt able to accurately sort cards based on color, able to identify but not sort by suit.  Pt min-mod A for seated balance with reaching for cards.  Scanning task R/L with cards on table with mod cuing for attention, mod cues for scanning L.  Sidelying and supine activities with focus on decreasing extension.  Passive flexion B LEs and trunk with increased resistant felt in L vs R.  Rolling with focus on head/neck flexion to assist with task, pt requires max manual facilitation for chin tuck with rolling.  Pt requires +2 assist with transfers due to trunk extension and pushing with R UE.  Limited this session by poor focused and sustained attention <30 seconds.   Deangelo Berns 10/20/2013, 9:59 AM

## 2013-10-20 NOTE — Progress Notes (Signed)
Social Work Patient ID: Tina Chambers, female   DOB: Jul 24, 1927, 79 y.o.   MRN: [id number] MD feels pt is medically ready for transfer tomorrow.  Will gather paperwork and prepare for transfer to NH tomorrow.

## 2013-10-20 NOTE — Plan of Care (Signed)
Problem: RH Wheelchair Mobility Goal: LTG Patient will propel w/c in controlled environment (PT) LTG: Patient will propel wheelchair in controlled environment, # of feet with assist (PT)  Outcome: Not Met (add Reason) Not met secondary to pt in dependent tilt in space w/c; unable to transition to manual w/c to begin w/c mobility training

## 2013-10-20 NOTE — Progress Notes (Signed)
Speech Language Pathology Daily Session Note  Patient Details  Name: Tina Chambers MRN: [id number] Date of Birth: 1927-09-04  Today's Date: 10/20/2013 Time: 1400-1430 Time Calculation (min): 30 min  Short Term Goals: Week 3: SLP Short Term Goal 1 (Week 3): Patient will participate in trials of Dys.1 and nectar-thick liquids with Mod verbal and tactile cues. SLP Short Term Goal 2 (Week 3): Patient will sustain attention for 5 minutes with Mod multi-modal cueing. SLP Short Term Goal 3 (Week 3): Patient will label 1 physical deficit post CVA with Mod questioning and verbal cues. SLP Short Term Goal 4 (Week 3): Patient will find item on left during structured task with Mod mulit-modal cueing.  Skilled Therapeutic Interventions: Patient participated in skilled co-treatment with OT who addressed positioning for increased attention to and participation in self-feeding activity.  SLP facilitated session with trails of Dys.1 and nectar-thick liquids via cup with Max cues to consume small cup sips.  Patient demonstrated increased wet vocal quality today, which SLP suspects was due to increased volume of cup sips as compared to teaspoon sips.  Patient required Max cues from SLP and OT to sustain attention for upright posture and Mod cues to attend to 3-5 bites at a time.  Patient consumed 2oz of puree and 3oz of nectar-thick liquid during trials.  Plan to discharge tomorrow; recommend to continue NPO due to severity of cognitive deficits at this time.    FIM:  Comprehension Comprehension Mode: Auditory Comprehension: 2-Understands basic 25 - 49% of the time/requires cueing 51 - 75% of the time Expression Expression Mode: Verbal Expression: 3-Expresses basic 50 - 74% of the time/requires cueing 25 - 50% of the time. Needs to repeat parts of sentences. Social Interaction Social Interaction: 2-Interacts appropriately 25 - 49% of time - Needs frequent redirection. Problem Solving Problem Solving:  1-Solves basic less than 25% of the time - needs direction nearly all the time or does not effectively solve problems and may need a restraint for safety Memory Memory: 2-Recognizes or recalls 25 - 49% of the time/requires cueing 51 - 75% of the time FIM - Eating Eating Activity: 5: Needs verbal cues/supervision;5: Set-up assist for open containers  Pain Pain Assessment Pain Assessment: No/denies pain  Therapy/Group: Individual Therapy  Charlane Ferretti., CCC-SLP [phone]  Kiauna Zywicki 10/20/2013, 5:40 PM

## 2013-10-20 NOTE — Progress Notes (Signed)
Occupational Therapy Session Note  Patient Details  Name: Tina Chambers MRN: [id number] Date of Birth: September 04, 1927  Today's Date: 10/20/2013 Time: 0730-0829 Time Calculation (min): 59 min  Short Term Goals: Week 3:  OT Short Term Goal 1 (Week 3): Patient will perform bed mobility of roling left <>right with maximal assistance in order to assist caregiver in BADL OT Short Term Goal 2 (Week 3): Pt will scan to Lt environment with mod verbal cues to obtain necessary items to complete self-care tasks OT Short Term Goal 3 (Week 3): Pt will complete BSC transfer with max assist of 1 caregiver with use of slide board PRN OT Short Term Goal 4 (Week 3): Pt will complete UB dressing with mod assist with mod verbal cues for sequencing OT Short Term Goal 5 (Week 3): Pt will complete bathing with max assist of 1 caregiver  Skilled Therapeutic Interventions/Progress Updates:   Pt seen for ADL retraining with focus on increased participation during bathing and dressing. Pt in bed upon arrival with episode of incontinence. Pt required Total assist of one caregiver for bed mobility to change brief. Pt required Total assist + 2 transfer from EOB > w/c due decreased participation and  pushing with RUE. Pt completed bathing and dressing in w/c at sink sit > stand level. Pt able to wash her face, chest, and left arm when given washcloth without cueing this session. Provided questioning cues of what to wash next, pt responded appropriately however needed Max multimodal cues to follow through with washing. Pt perseverated on washing RLE and left arm this session. Pt participated in donning shirt by helping thread RUE and pulling her head through the hole with the RUE again this session. Pt required + 2 to stand to pull up pants. Pt performed grooming at sink. Pt left in w/c at nurses station.   Therapy Documentation Precautions:  Precautions Precautions: Fall Precaution Comments: L neglect; NG tube; maintain  HOB >40 deg Restrictions Weight Bearing Restrictions: No  Pain: No c/o pain   See FIM for current functional status  Therapy/Group: Individual Therapy  Donna Christen 10/20/2013, 9:25 AM

## 2013-10-20 NOTE — Progress Notes (Signed)
Occupational Therapy Discharge Summary  Patient Details  Name: Tina Chambers MRN: [id number] Date of Birth: [birth year]  Today's Date: 10/20/2013  Patient has met 1 of 11 long term goals due to improved activity tolerance, improved balance, postural control, improved attention and improved awareness.  Patient to discharge at overall Total Assist level.  Patient's care partner unavailable to provide the necessary physical and cognitive assistance at discharge; pt to d/c to SNF for more long term rehabilitation to reach higher level of participation in self-care tasks and functional mobility prior to d/c home.  Reasons goals not met: Pt requires +2 for all functional transfers and functional mobility, requires Total assist bed level or seated self care and +2 when standing. Pt requires assist at above levels due to decreased sitting balance, left inattention, decreased attention, and decreased sequencing. Pt requires max multimodal cues for attention, initiation, and sequencing to participate in self-care tasks.  Recommendation:  Patient will benefit from ongoing skilled OT services in skilled nursing facility setting to continue to advance functional skills in the area of BADL and Reduce care partner burden.  Equipment: No equipment provided  Reasons for discharge: discharge from hospital  Patient/family agrees with progress made and goals achieved: Yes  OT Discharge Precautions/Restrictions  Precautions Precautions: Fall Precaution Comments: Peg tube; maintain HOB >40 when feeding Restrictions Weight Bearing Restrictions: No Vital Signs Therapy Vitals Temp: 98.6 F (37 C) Temp src: Oral Pulse Rate: 81 Resp: 16 BP: 135/55 mmHg Patient Position, if appropriate: Lying Oxygen Therapy SpO2: 98 % ADL   See FIM Vision/Perception  Vision - History Baseline Vision: Wears glasses only for reading Vision - Assessment Eye Alignment: Within Functional Limits Vision Assessment:  Vision impaired - to be further tested in functional context Alignment/Gaze Preference: Gaze right;Head tilt;Head turned;Chin down Additional Comments: Right gaze preference, requires Max cues to scan to left.  Perception Perception: Impaired Inattention/Neglect: Does not attend to left side of body Body Scheme: Requires Mod-Max cues to locate Left side of body Spatial Orientation: left inattention Praxis Praxis: Impaired Praxis Impairment Details: Initiation;Motor planning;Perseveration  Cognition Overall Cognitive Status: History of cognitive impairments - at baseline Arousal/Alertness: Awake/alert Orientation Level: Oriented to person;Disoriented to place Attention: Focused Memory: Impaired Awareness: Impaired Problem Solving: Impaired Safety/Judgment: Impaired Sensation Sensation Light Touch: Impaired by gross assessment Proprioception: Impaired by gross assessment Coordination Gross Motor Movements are Fluid and Coordinated: No Fine Motor Movements are Fluid and Coordinated: No Coordination and Movement Description: Impaired secondary to neglect and hemiplegia/tone  Trunk/Postural Assessment  Cervical Assessment Cervical Assessment: Exceptions to [facility] Thoracic Assessment Thoracic Assessment: Exceptions to [facility] Lumbar Assessment Lumbar Assessment: Exceptions to [facility] Postural Control Postural Control: Deficits on evaluation  Balance Static Sitting Balance Static Sitting - Balance Support: Feet supported;Right upper extremity supported;Left upper extremity supported Dynamic Sitting Balance Dynamic Sitting - Balance Support: Feet supported;Left upper extremity supported Static Standing Balance Static Standing - Balance Support: Right upper extremity supported;Left upper extremity supported Static Standing - Level of Assistance: 1: +2 Total assist;Other (comment) (standing frame) Dynamic Standing Balance Dynamic Standing - Balance Support: Bilateral upper extremity  supported Dynamic Standing - Level of Assistance: 1: +2 Total assist Extremity/Trunk Assessment RUE Assessment RUE Assessment: Within Functional Limits LUE Assessment LUE Assessment: Exceptions to WFL LUE AROM (degrees) LUE Overall AROM Comments: Patient with flacid LUE, no active movement in shoulder, elbow, wrist, or fingers.  LUE PROM (degrees) LUE Overall PROM Comments: PROM is WFL LUE Strength LUE Overall Strength: Deficits (0/5)  See FIM for  current functional status  Nolon Stalls 10/20/2013, 4:07 PM

## 2013-10-21 LAB — OCCULT BLOOD X 1 CARD TO LAB, STOOL
Fecal Occult Bld: NEGATIVE
Fecal Occult Bld: NEGATIVE

## 2013-10-21 MED ORDER — ACETAMINOPHEN 325 MG PO TABS: 325.0000 mg | ORAL_TABLET | Freq: Four times a day (QID) | ORAL | Status: DC | PRN

## 2013-10-21 MED ORDER — LEVOTHYROXINE SODIUM 75 MCG PO TABS: 75.0000 ug | ORAL_TABLET | Freq: Every day | ORAL | Status: AC

## 2013-10-21 MED ORDER — BACLOFEN 20 MG PO TABS: 20.0000 mg | ORAL_TABLET | Freq: Every evening | ORAL | Status: DC | PRN

## 2013-10-21 MED ORDER — TRAMADOL HCL 50 MG PO TABS: 25.0000 mg | ORAL_TABLET | Freq: Three times a day (TID) | ORAL | Status: DC | PRN

## 2013-10-21 MED ORDER — AMLODIPINE BESYLATE 5 MG PO TABS: 5.0000 mg | ORAL_TABLET | Freq: Every day | ORAL | Status: DC

## 2013-10-21 MED ORDER — TRAZODONE 25 MG HALF TABLET: 25.0000 mg | ORAL_TABLET | Freq: Every evening | ORAL | Status: DC | PRN

## 2013-10-21 MED ORDER — JEVITY 1.2 CAL PO LIQD: 320.0000 mL | Freq: Four times a day (QID) | ORAL | Status: DC

## 2013-10-21 MED ORDER — FREE WATER: 200.0000 mL | Freq: Three times a day (TID) | Status: DC

## 2013-10-21 MED ORDER — APIXABAN 2.5 MG PO TABS: 2.5000 mg | ORAL_TABLET | Freq: Two times a day (BID) | ORAL | Status: AC

## 2013-10-21 MED ORDER — ATORVASTATIN CALCIUM 10 MG PO TABS: 10.0000 mg | ORAL_TABLET | Freq: Every day | ORAL | Status: DC

## 2013-10-21 MED ORDER — FLUOXETINE HCL 10 MG PO CAPS: 10.0000 mg | ORAL_CAPSULE | Freq: Every day | ORAL | Status: DC

## 2013-10-21 MED ORDER — BIOTENE DRY MOUTH MT LIQD: 15.0000 mL | Freq: Two times a day (BID) | OROMUCOSAL | Status: DC

## 2013-10-21 MED ORDER — POTASSIUM CHLORIDE 20 MEQ/15ML (10%) PO LIQD: 20.0000 meq | Freq: Two times a day (BID) | ORAL | Status: AC

## 2013-10-21 MED ORDER — PANTOPRAZOLE SODIUM 40 MG PO PACK: 40.0000 mg | PACK | Freq: Every day | ORAL | Status: DC

## 2013-10-21 MED ORDER — TORSEMIDE 10 MG PO TABS: 5.0000 mg | ORAL_TABLET | Freq: Every day | ORAL | Status: AC

## 2013-10-21 MED ORDER — SOTALOL HCL 80 MG PO TABS: 80.0000 mg | ORAL_TABLET | Freq: Two times a day (BID) | ORAL | Status: AC

## 2013-10-21 NOTE — Progress Notes (Signed)
Speech Language Pathology Discharge Summary  Patient Details  Name: Tina Chambers MRN: [id number] Date of Birth: 21-Jul-1927  Today's Date: 10/21/2013  Patient has met 3 of 4 long term goals.  Patient to discharge at overall Mod;Max level.  Reasons goals not met: patient's ability to sustain attention remains severly impaired; she is unable to attent for 1 min in a quiet environment due to internal distractions   Clinical Impression/Discharge Summary: Patient met 3 out of 4 long term goals during her CIR stay due to gains in arousal/attention, ability to scan to midline and left, awareness and speech intelligibility.  Progress towards foundational cognitive abilities positively impacted swallow function and ability to participate in dysphagia therapy.  Per objective assessment and therapeutic trials patient safely tolerating Dys.1 textures and nectar-thick liquids via teaspoon with Mod-Max cues for sustained attention to and ability to self-feed safely.  Patient currently requiring Max assist for sustained attention 30-45 second increments; Mod assist for intellectual awareness, scanning to left of midline; Min assist for speech intelligibility.  It is recommended that patient continue to receive skilled SLP services to continue to focus on cognitive and dysphagia goals to maximize safety, functional independence.  Care Partner:  Caregiver Able to Provide Assistance: No  Type of Caregiver Assistance: Physical;Cognitive  Recommendation:  Skilled Nursing facility;24 hour supervision/assistance  Rationale for SLP Follow Up: Maximize cognitive function and independence;Maximize swallowing safety;Reduce caregiver burden   Equipment: none   Reasons for discharge: Lack of progress towards goals;Discharged from hospital   Patient/Family Agrees with Progress Made and Goals Achieved: Yes   See FIM for current functional status  Charlane Ferretti.,  CCC-SLP [phone]  Brittannie Tawney 10/21/2013, 2:32 PM

## 2013-10-21 NOTE — Progress Notes (Signed)
Discharged to SNF. Report called to [employer]. Ultram 25 mg given prior to transfer on stretcher. No other changes noted. Family packed up belongings.  Pamelia Hoit

## 2013-10-21 NOTE — Progress Notes (Signed)
Social Work Discharge Note Discharge Note  The overall goal for the admission was met for:   Discharge location: Yes-[facility] [place]  Length of Stay: Yes-19 DAYS  Discharge activity level: Yes-MIN/MOD ASSIST  Home/community participation: Yes  Services provided included: MD, RD, PT, OT, SLP, RN, Pharmacy and SW  Financial Services: Medicare and Private Insurance: [employer]  Follow-up services arranged: Other: [facility]  Comments (or additional information):FAMILY HAS CHOSEN [facility] AS THEIR FIRST CHOICE-CLOSER TO PT'S HOME  Patient/Family verbalized understanding of follow-up arrangements: Yes  Individual responsible for coordination of the follow-up plan: DEBBIE-DAUGHTER,WAYNE-SON-POA-OTHER TOW CHILDREN  Confirmed correct DME delivered: Lucy Chris 10/21/2013    Lucy Chris

## 2013-12-01 ENCOUNTER — Inpatient Hospital Stay: Payer: Medicare Other | Admitting: Physical Medicine & Rehabilitation

## 2013-12-01 ENCOUNTER — Encounter: Payer: Medicare Other | Attending: Physical Medicine & Rehabilitation

## 2013-12-08 ENCOUNTER — Telehealth: Payer: Self-pay | Admitting: *Deleted

## 2013-12-08 NOTE — Telephone Encounter (Signed)
Reschedule later after Dc from SNF

## 2013-12-08 NOTE — Telephone Encounter (Signed)
Patient is in a rehab in [facility] now getting therapy right now.   Patient would have to be transported by the Nursing home and the family would have to meet her over here.  If the appointment is going to help her they will arrange for her to come in but if it is going to be that she will be told to continue what she is doing,they would rather not bring her.    Patient is making slow progress they were concerned about her cognitive but they are giving her Lexapro.  Appointment would have to be changed from Thursday if she does have to come in due to a conflict with transportation at her facility.  Please call patient's daughter Wynonia SoursDebbie Brown at [phone]  To re-schedule the followup with Dr. Pearlean BrownieSethi.

## 2013-12-10 ENCOUNTER — Ambulatory Visit: Payer: Self-pay | Admitting: Neurology

## 2013-12-10 NOTE — Telephone Encounter (Signed)
Called patient and spoke with patient's daughter debbie to resch patient's appt. Patient's daughter Eunice Blasedebbie agreed to bring patient in on 01/05/14 at 1:30 p.m. I advised the patient's daughter that if she has any other problems, questions or concerns to call the office.

## 2014-01-05 ENCOUNTER — Encounter: Payer: Self-pay | Admitting: Neurology

## 2014-01-05 ENCOUNTER — Ambulatory Visit (INDEPENDENT_AMBULATORY_CARE_PROVIDER_SITE_OTHER): Payer: Medicare Other | Admitting: Neurology

## 2014-01-05 ENCOUNTER — Other Ambulatory Visit (HOSPITAL_COMMUNITY): Payer: Self-pay | Admitting: Interventional Radiology

## 2014-01-05 ENCOUNTER — Telehealth: Payer: Self-pay | Admitting: *Deleted

## 2014-01-05 ENCOUNTER — Encounter (INDEPENDENT_AMBULATORY_CARE_PROVIDER_SITE_OTHER): Payer: Self-pay

## 2014-01-05 ENCOUNTER — Telehealth (HOSPITAL_COMMUNITY): Payer: Self-pay | Admitting: Interventional Radiology

## 2014-01-05 VITALS — BP 91/56 | HR 91

## 2014-01-05 DIAGNOSIS — R413 Other amnesia: Secondary | ICD-10-CM

## 2014-01-05 DIAGNOSIS — I639 Cerebral infarction, unspecified: Secondary | ICD-10-CM

## 2014-01-05 DIAGNOSIS — F0150 Vascular dementia without behavioral disturbance: Secondary | ICD-10-CM

## 2014-01-05 DIAGNOSIS — G8110 Spastic hemiplegia affecting unspecified side: Secondary | ICD-10-CM

## 2014-01-05 MED ORDER — ENSURE PO LIQD: 1.0000 | Freq: Every morning | ORAL | Status: DC

## 2014-01-05 NOTE — Telephone Encounter (Signed)
Called pt's son told him that Dr. Corliss Skainseveshwar wanted to the pt to have 2 month f/u MRI/MRA brain following her acute recent stroke.  Pt' son states that he is taking his mother to an appointment with Dr. Pearlean BrownieSethi today (01/05/14) and he will call me back after that appt to schedule study. JM

## 2014-01-05 NOTE — Progress Notes (Signed)
[facility] [address] [place]. [place] [address] [phone]       OFFICE FOLLOW-UP NOTE  Ms. Tina Chambers Date of Birth:  [birth year] Medical Record Number:  [id number]   HPI: 18 year Caucasian lady today for the first office visit following hospital admission on 09/29/13 for stroke. She presented with left hemiplegia right gaze deviation left visual field deficit from assisted living facility where she was staying. She was not a candidate for  tpa due to h/o GI bleeding  But underwent mechanical clot extraction.l. CT scan of the head showed no acute abnormality. MRI scan showed a large right middle cerebral artery infarct involving the right frontal, temporal, parietal lobes as well as basal ganglia. Cytotoxic edema and some midline shift. She was treated conservatively and remained stable without requiring intubation. Transthoracic echo showed normal ejection fraction without cardiac source of embolism. Patient underwent cerebral catheter angiogram emergently which showed right middle cerebral artery occlusion which was revascularized completely using 5 mg of intra-arterial TPA and one pass of the solitaire thrombectomy device. The patient however did not history significant neurological improvement and remained with dense left hemiplegia and gaze deviation. She had some dysphagia initially and was seen by physical ,occupational speech therapy. She'll was not felt to be a good patient for inpatient rehabilitation and hence was transferred to nursing home.  Patient was found to have atrial fibrillation and this was felt to be the etiology of her stroke. She was not considered a candidate for anticoagulation as she has had history of GI bleeding on xarelto in the past. She was kept on aspirin. Patient is unable to give history which was obtained today from her daughter who is accompanying her. She continues to do poorly  And needs help with all activities of daily living. She  is yet unable to stand or walk even with two-person assist. Her speech and swallowing seemed to have improved. She is able to swallow okay and scheduled to undergo a modified barium next week. She still has a peg tube. She however is not eating well and has lost 20 pounds. She got dehydrated and she had labwork done on 12/31/13 early this week which showed serum sodium of 162 and creatinine of 2 .03. She also elevated white count of 18. UA the last week showed no growth. The patient however has had cognitive worsening since her stroke. She is found to be confused at times and having visual hallucinations and seeing her dead husband. She is also disoriented most of the time. She is however pleasant and smiles and has not had any agitation issues.  ROS:   14 system review of systems is positive for confusion, numbness,difficulty swallowing, decreased energy.hallucinations,and incontinence only  PMH:  Past Medical History  Diagnosis Date  . Hypertension   . Atrial fibrillation   . CHF (congestive heart failure)   . Hypothyroid   . Hyperlipidemia   . GIB (gastrointestinal bleeding)     recurrent  . Pelvic fracture     Social History:  History   Social History  . Marital Status: Unknown    Spouse Name: N/A    Number of Children: N/A  . Years of Education: N/A   Occupational History  . Not on file.   Social History Main Topics  . Smoking status: Never Smoker   . Smokeless tobacco: Not on file  . Alcohol Use: No  . Drug Use: No  . Sexual Activity: Not on file   Other  Topics Concern  . Not on file   Social History Narrative  . No narrative on file    Medications:   Current Outpatient Prescriptions on File Prior to Visit  Medication Sig Dispense Refill  . acetaminophen (TYLENOL) 325 MG tablet Take 1-2 tablets (325-650 mg total) by mouth every 6 (six) hours as needed for mild pain.      Marland Kitchen amLODipine (NORVASC) 5 MG tablet Place 1 tablet (5 mg total) into feeding tube daily.      Marland Kitchen  apixaban (ELIQUIS) 2.5 MG TABS tablet Place 1 tablet (2.5 mg total) into feeding tube 2 (two) times daily.  60 tablet    . atorvastatin (LIPITOR) 10 MG tablet Place 1 tablet (10 mg total) into feeding tube daily.      . ferrous sulfate 325 (65 FE) MG tablet Take 325 mg by mouth 2 (two) times daily with a meal.      . levothyroxine (SYNTHROID, LEVOTHROID) 75 MCG tablet Place 1 tablet (75 mcg total) into feeding tube daily before breakfast.      . Nutritional Supplements (FEEDING SUPPLEMENT, JEVITY 1.2 CAL,) LIQD Place 320 mLs into feeding tube 4 (four) times daily.    0  . pantoprazole sodium (PROTONIX) 40 mg/20 mL PACK Place 20 mLs (40 mg total) into feeding tube daily.  30 each    . potassium chloride 20 MEQ/15ML (10%) solution Take 15 mLs (20 mEq total) by mouth 2 (two) times daily.  500 mL  0  . sotalol (BETAPACE) 80 MG tablet Place 1 tablet (80 mg total) into feeding tube 2 (two) times daily.      Marland Kitchen torsemide (DEMADEX) 10 MG tablet Place 0.5 tablets (5 mg total) into feeding tube daily.      . traMADol (ULTRAM) 50 MG tablet Place 0.5 tablets (25 mg total) into feeding tube 3 (three) times daily as needed for moderate pain.  30 tablet  0  . traZODone (DESYREL) 25 mg TABS tablet Take 0.5-1 tablets (25-50 mg total) by mouth at bedtime as needed for sleep.      . Water For Irrigation, Sterile (FREE WATER) SOLN Place 200 mLs into feeding tube 4 (four) times daily -  with meals and at bedtime.      Marland Kitchen FLUoxetine (PROZAC) 10 MG capsule Place 1 capsule (10 mg total) into feeding tube daily.    3   No current facility-administered medications on file prior to visit.    Allergies:   Allergies  Allergen Reactions  . Phenergan [Promethazine Hcl]     From [facility]    Physical Exam General: frail elderly Caucasian lady seated, in no evident distress Head: head normocephalic and atraumatic. Orohparynx benign Neck: supple with no carotid or supraclavicular bruits Cardiovascular: regular rate and rhythm, no  murmurs Musculoskeletal: no deformity Skin:  no rash/petichiae Vascular:  Normal pulses all extremities Filed Vitals:   01/05/14 1338  BP: 91/56  Pulse: 91   Neurologic Exam Mental Status: Awake and fully alert. Disoriented to place and time. Recent and remote memory poor. Attention span, concentration and fund of knowledge diminished. Mood and affect appropriate. Follows one step commands only. Cranial Nerves: Fundoscopic exam reveals sharp disc margins. Pupils equal, briskly reactive to light. Extraocular movements full without nystagmus. Visual fields decrease blink to left side on  confrontation. Hearing intact. Facial sensation intact.Mild weakness lower  Face.  Tongue, palate moves normally and symmetrically.  Motor:spastic dense left hemiplegia with 0/5 LUE and LLE strength. Pain on passive range  of motion left shoulder and arm. Spasticity and increased tone on left. Sensory.: diminished  touch and pinprick and vibratory sensation on left.  Coordination: Rapid alternating movements normal on right and cannot be tested on left. Gait and Station: unable to arise from chair even with assistance hence gait deferred. Reflexes: 1+ and symmetric. Toes downgoing.   NIHSS  12 Modified Rankin  4   ASSESSMENT: [age] year with large right MCA infarct in November 2014  Of cardioembolic etiology from atrial fibrillation treated with mechanical embolectomy with solitaire device and intra-arterial TPA with complete recannalization but with significant residual spastic left hemiplegia and cognitive decline post stroke likely combination of underlying MCI plus damage from stroke and superimposed metabolic derangement and infectiona.    PLAN:I had a long discussion with the patient, daughter and son regarding her devastating stroke, progressive cognitive impairment which is likely  Multifactorial and answered questions. Correct metabolic and infectious parameters.Check dementia panel labs, EEG and start  Ensure   protein supplements for her malnutrition. I will hold off on Aricept-like medications for now do to her weight loss and anorexia. Consider referral to Dr. Senaida OresKerstein's from rehabilitation for management of left shoulder pain and upper extremity spasticity. She may return for followup in the future only as necessary.     Note: This document was prepared with digital dictation and possible smart phrase technology. Any transcriptional errors that result from this process are unintentional

## 2014-01-05 NOTE — Telephone Encounter (Signed)
I called and spoke to [facility].  Checking on Urinalysis , if done recently.  Checking notes I do not see this.  Nurse stated that no growth on 12-12-13 urine sample.  Consulted Dr. Pearlean BrownieSethi.  Will keep order for urinalysis and do this at the facility.   To fax results to us at [phone].  Daughter verbalized understanding.   Order given to daughter.

## 2014-01-05 NOTE — Patient Instructions (Addendum)
I had a long discussion with the patient, daughter and son regarding her devastating stroke, progressive cognitive impairment which is likely vascular dementia and answered questions. Check dementia panel labs, EEG and start Ensure   protein supplements for her malnutrition.Corrrect metabolic and infectious parameters. I will hold off on Aricept-like medications for now do to her weight loss and anorexia. Consider referral to Dr. Senaida OresKerstein's from rehabilitation for management of left shoulder pain and upper extremity spasticity. She may return for followup in the future only as necessary.

## 2014-01-06 LAB — CBC WITH DIFFERENTIAL/PLATELET
Basophils Absolute: 0 10*3/uL (ref 0.0–0.2)
Basos: 0 %
Eos: 0 %
Eosinophils Absolute: 0.1 10*3/uL (ref 0.0–0.4)
HEMATOCRIT: 34 % (ref 34.0–46.6)
Hemoglobin: 10.9 g/dL — ABNORMAL LOW (ref 11.1–15.9)
IMMATURE GRANS (ABS): 0.3 10*3/uL — AB (ref 0.0–0.1)
Immature Granulocytes: 2 %
LYMPHS ABS: 1.5 10*3/uL (ref 0.7–3.1)
Lymphs: 9 %
MCH: 27.2 pg (ref 26.6–33.0)
MCHC: 32.1 g/dL (ref 31.5–35.7)
MCV: 85 fL (ref 79–97)
MONOS ABS: 0.4 10*3/uL (ref 0.1–0.9)
Monocytes: 3 %
Neutrophils Absolute: 14.7 10*3/uL — ABNORMAL HIGH (ref 1.4–7.0)
Neutrophils Relative %: 86 %
RBC: 4.01 x10E6/uL (ref 3.77–5.28)
RDW: 17.3 % — AB (ref 12.3–15.4)
WBC: 17 10*3/uL — AB (ref 3.4–10.8)

## 2014-01-06 LAB — COMPREHENSIVE METABOLIC PANEL
A/G RATIO: 0.9 — AB (ref 1.1–2.5)
ALBUMIN: 2.8 g/dL — AB (ref 3.5–4.7)
ALT: 25 IU/L (ref 0–32)
AST: 18 IU/L (ref 0–40)
Alkaline Phosphatase: 101 IU/L (ref 39–117)
BUN/Creatinine Ratio: 36 — ABNORMAL HIGH (ref 11–26)
BUN: 31 mg/dL — AB (ref 8–27)
CO2: 22 mmol/L (ref 18–29)
Calcium: 9.3 mg/dL (ref 8.7–10.3)
Chloride: 116 mmol/L (ref 97–108)
Creatinine, Ser: 0.85 mg/dL (ref 0.57–1.00)
GFR, EST AFRICAN AMERICAN: 73 mL/min/{1.73_m2} (ref >59)
GFR, EST NON AFRICAN AMERICAN: 63 mL/min/{1.73_m2} (ref >59)
GLOBULIN, TOTAL: 3.1 g/dL (ref 1.5–4.5)
Glucose: 132 mg/dL — ABNORMAL HIGH (ref 65–99)
Potassium: 4.9 mmol/L (ref 3.5–5.2)
Sodium: 152 mmol/L — ABNORMAL HIGH (ref 134–144)
Total Protein: 5.9 g/dL — ABNORMAL LOW (ref 6.0–8.5)

## 2014-01-06 LAB — TSH: TSH: 1.68 u[IU]/mL (ref 0.450–4.500)

## 2014-01-06 LAB — RPR: RPR: NONREACTIVE

## 2014-01-07 ENCOUNTER — Telehealth (HOSPITAL_COMMUNITY): Payer: Self-pay | Admitting: Interventional Radiology

## 2014-01-07 NOTE — Telephone Encounter (Signed)
Pt's son call and left a VM that they have decided to put pt's MRI/MRA on hold for now. He states that pt is seeing Dr. Pearlean BrownieSethi and is having other health issues and feels like they should hold off on this test for now at the advice of Dr. Pearlean BrownieSethi. JM

## 2014-01-08 ENCOUNTER — Telehealth: Payer: Self-pay | Admitting: *Deleted

## 2014-01-08 NOTE — Telephone Encounter (Signed)
Called patient's son Tina Chambers to discuss getting a letter written for her mother stating that the patient is incapable of handling her financing affairs. I informed the patient's son that Dr. Pearlean BrownieSethi would have to make that determination. Would you like a letter to be written. Please advise.

## 2014-01-11 NOTE — Progress Notes (Signed)
Let patient know labs look normal-thyroid hormone, chemistries and test for syphilis. White cell count is elevated slightly of unclear significance

## 2014-01-11 NOTE — Telephone Encounter (Signed)
Ok to do letter

## 2014-01-13 ENCOUNTER — Encounter: Payer: Self-pay | Admitting: *Deleted

## 2014-01-14 NOTE — Telephone Encounter (Signed)
Called patient's son Idamae SchullerWayne Gonsalves to inform him that the letter had been written and it was being mailed out today. I advised the patient's son if the patient has any other problems, questions or concerns to call the office. Patient's son verbalized understanding.

## 2014-05-18 NOTE — Telephone Encounter (Signed)
Let was sent by sandy syoung

## 2014-06-16 IMAGING — CT CT HEAD W/O CM
1 of 2 series · 15 of 30 positions shown, 19 images · non-contrast
Comparison: Brain MRI 10/01/2013 and earlier.

CLINICAL DATA: 86-year- female with increased weakness and fatigue.
Recent right MCA infarct. Initial encounter.

EXAM:
CT HEAD WITHOUT CONTRAST
TECHNIQUE: Contiguous axial images were obtained from the base of the skull
through the vertex without intravenous contrast.

[Series 2: head 5.0 h30s · axial · 0.40mm/px · z∈[-148,-18]mm · 15 of 30 slices shown, 19 images]
[im 2/30  brain]
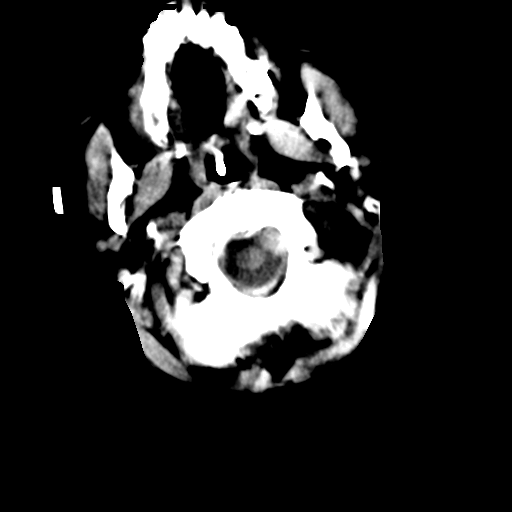
[im 2/30  bone]
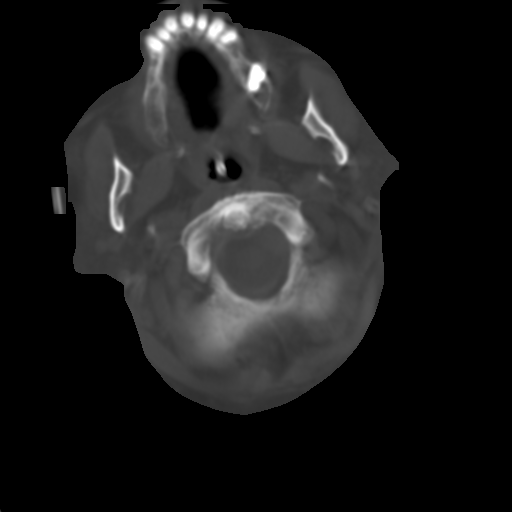
[im 4/30  brain]
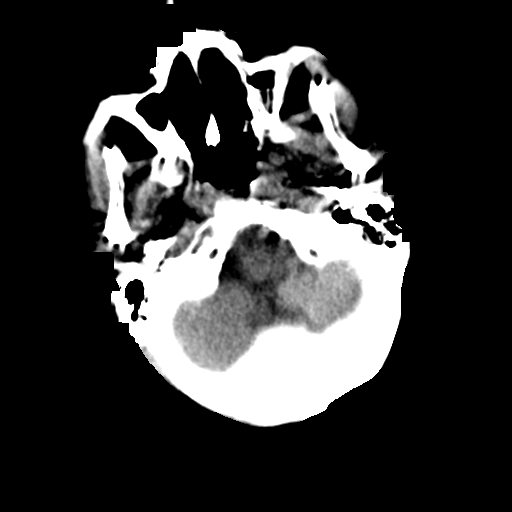
[im 7/30  brain]
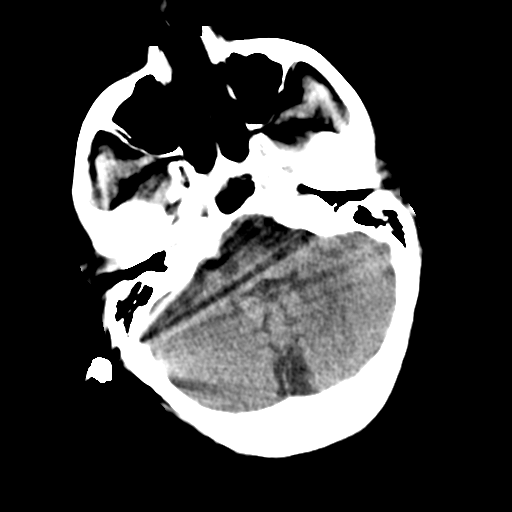
[im 8/30  brain]
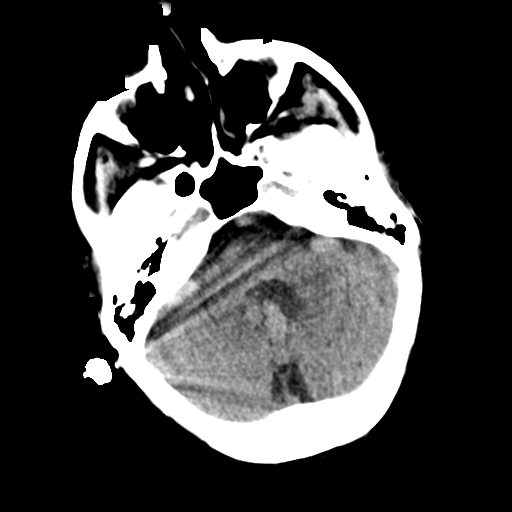
[im 10/30  brain]
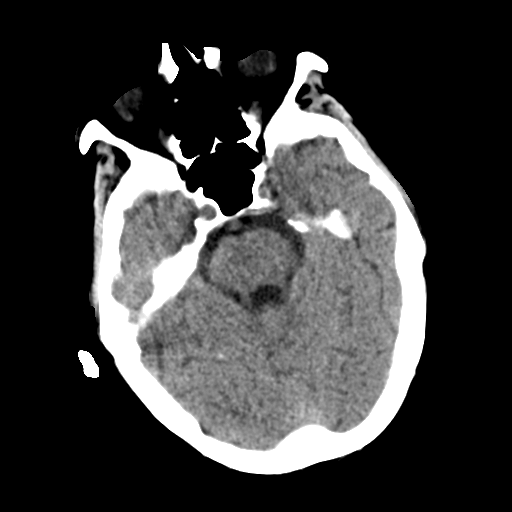
[im 10/30  bone]
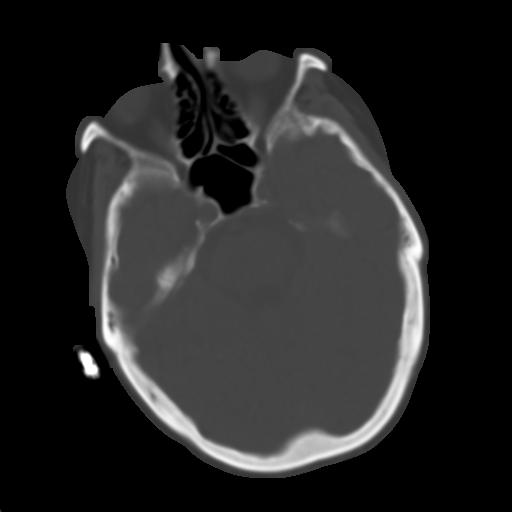
[im 11/30  brain]
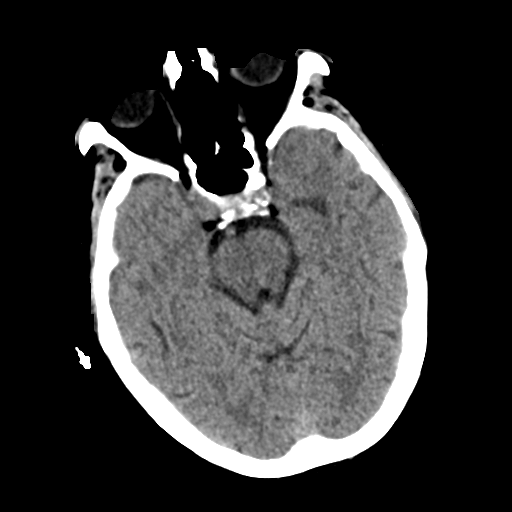
[im 13/30  brain]
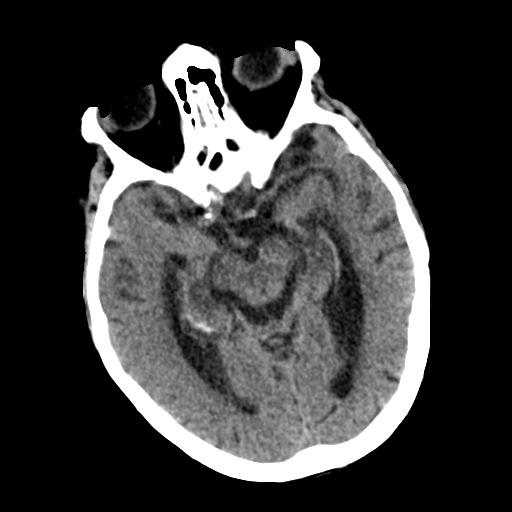
[im 16/30  brain]
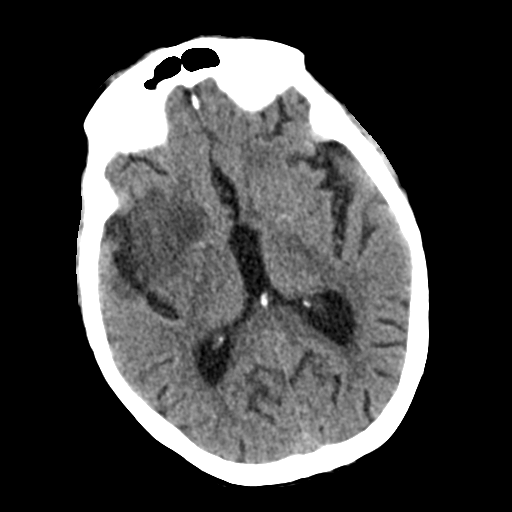
[im 17/30  brain]
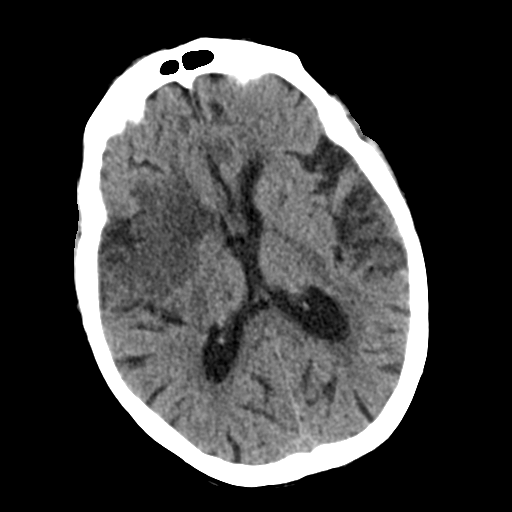
[im 17/30  bone]
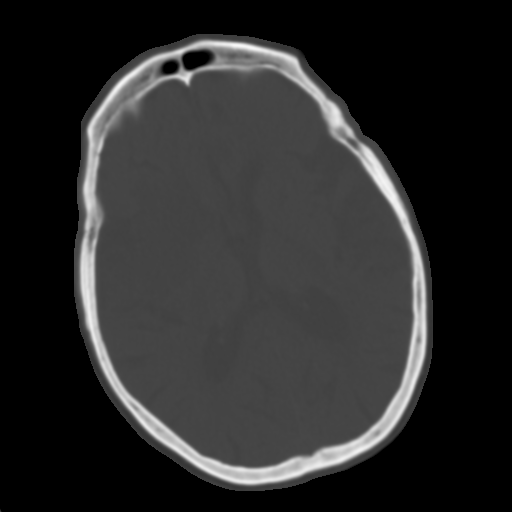
[im 19/30  brain]
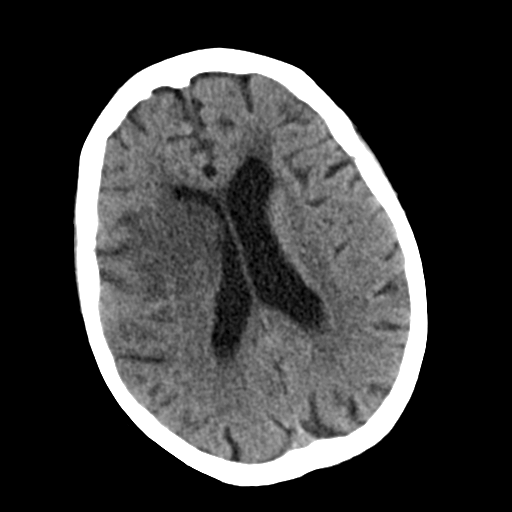
[im 20/30  brain]
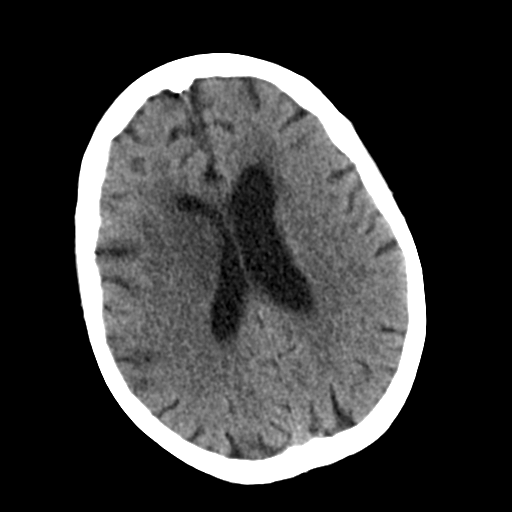
[im 22/30  brain]
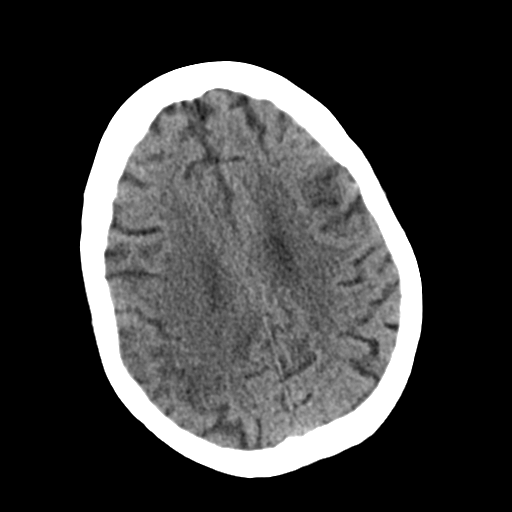
[im 25/30  brain]
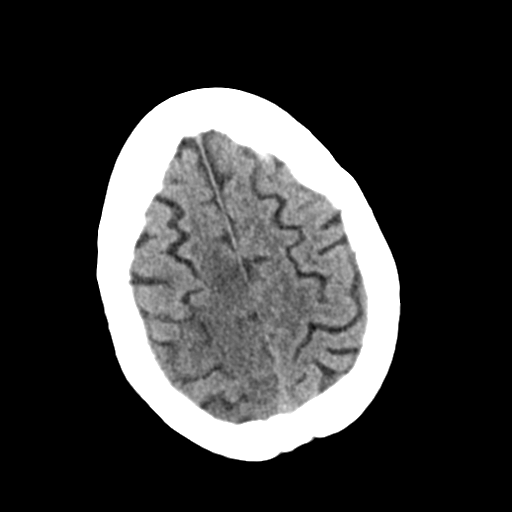
[im 25/30  bone]
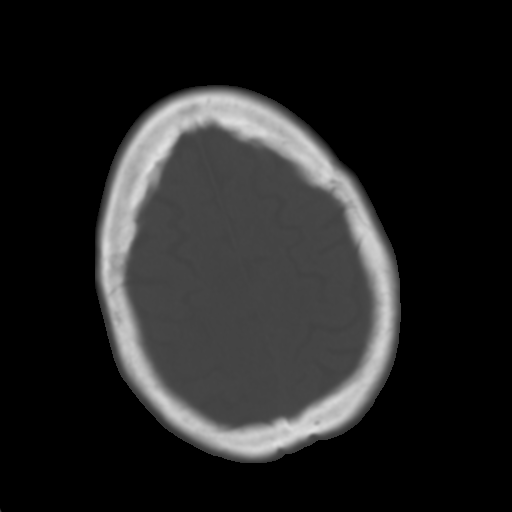
[im 26/30  brain]
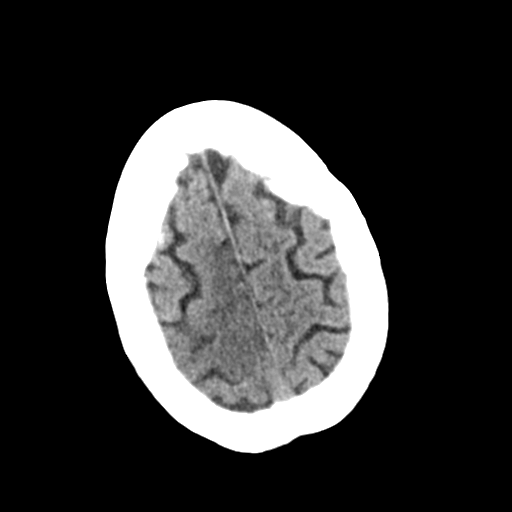
[im 28/30  brain]
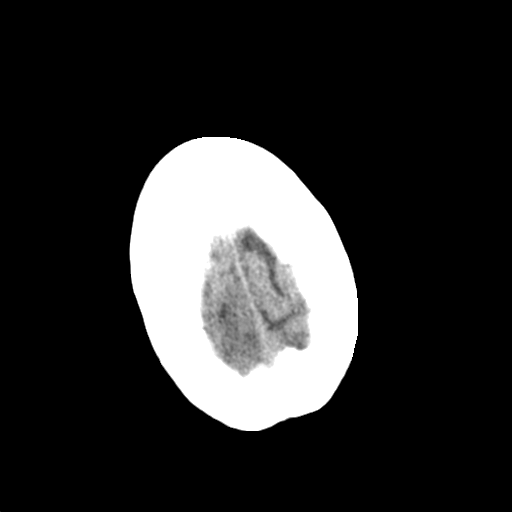

[15 of 30 positions shown; findings below may reference images not displayed]

FINDINGS: Right side nasoenteric tube. Right side hearing aid. Visualized
paranasal sinuses and mastoids are clear. *INSERT* bones No acute
orbit or scalp soft tissue findings.

Hypodensity in the right MCA territory corresponding to the area of
diffusion abnormality on 10/01/2013. Mild associated mass effect,
primarily at the basal ganglia, stable. Mild effacement of the right
lateral ventricle. Basilar cisterns remain patent. No midline shift.
No acute intracranial hemorrhage identified.

Superimposed chronic right full amic lacunar infarct. Stable and
normal gray-white matter differentiation in the left hemisphere and
posterior fossa.
IMPRESSION: 1. Expected evolution of right MCA infarct. Stable mild mass effect
and no associated hemorrhage.

2. No new intracranial abnormality identified.

## 2014-06-25 IMAGING — RF DG ABDOMEN 1V
1 series · 1 of 1 positions shown · non-contrast
Comparison: 09/30/2013.

CLINICAL DATA: Feeding tube placement.

EXAM:
ABDOMEN - 1 VIEW

[Series 1: run · 1 of 1 slices shown]
[im 1/1]
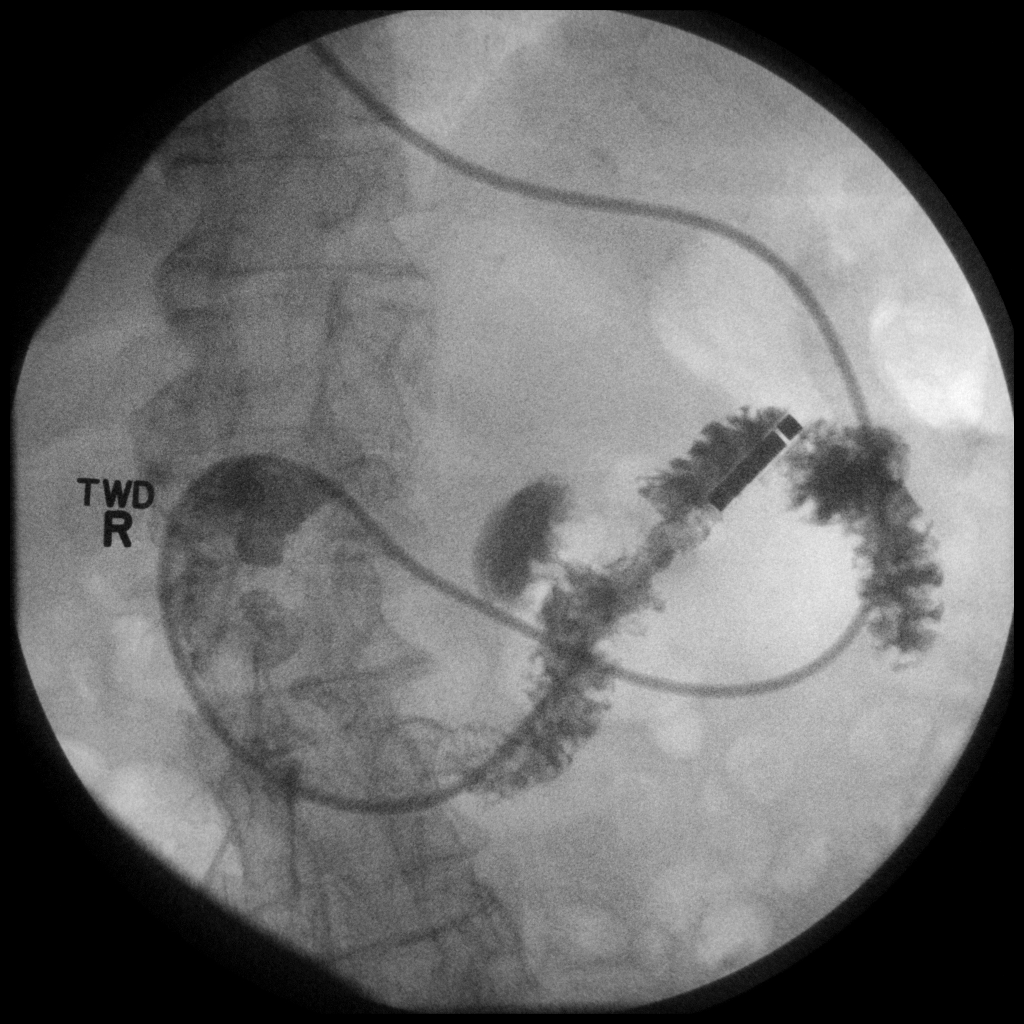

[1 of 1 positions shown; findings below may reference images not displayed]

FINDINGS: A Dobbhoff feeding to was unclogged and advanced under fluoroscopic
guidance into the region of the ligament of Treitz.
IMPRESSION: Dobbhoff feeding tube tip is at the junction of the 4th portion of
the duodenum and the jejunum.

## 2014-06-27 IMAGING — XA IR PERC PLACEMENT GASTROSTOMY
1 series · 12 of 13 positions shown · non-contrast
Comparison: none

CLINICAL DATA: Dysphagia, atrial fibrillation, CHF, right MCA
stroke

[Series 1: run · 12 of 13 slices shown]
[im 1/13]
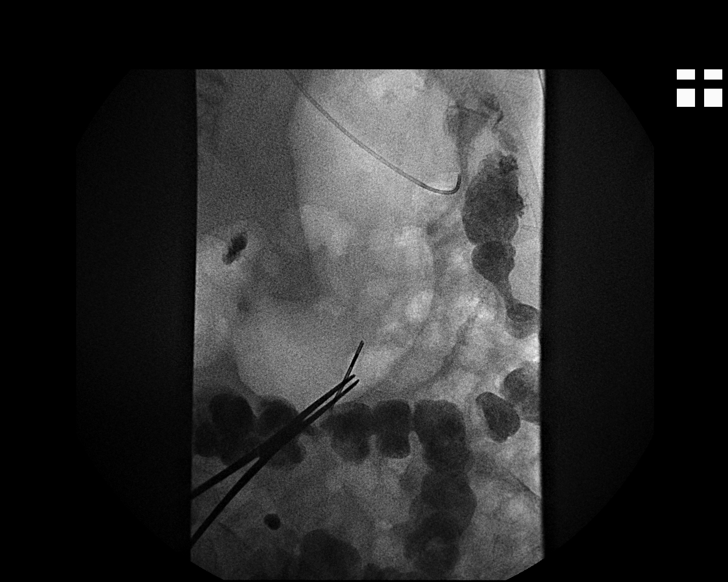
[im 2/13]
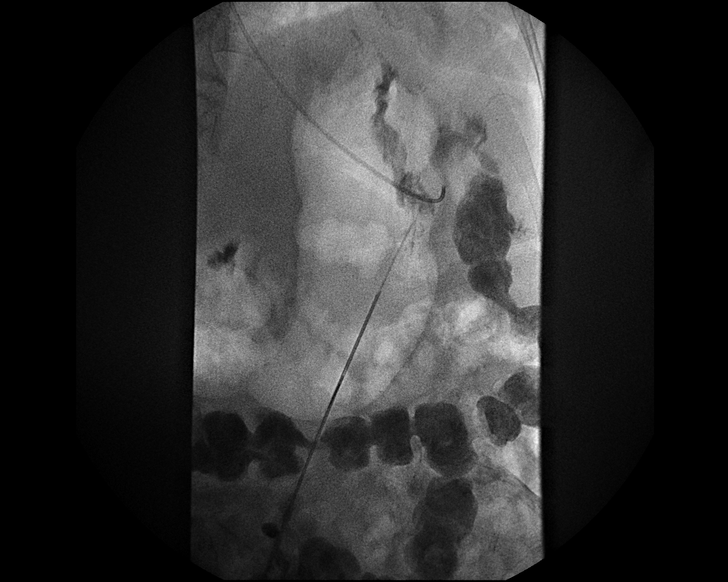
[im 3/13]
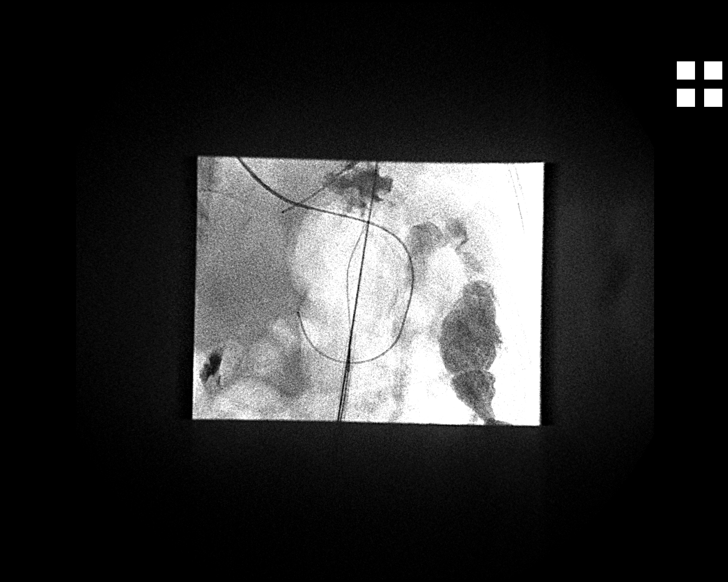
[im 4/13]
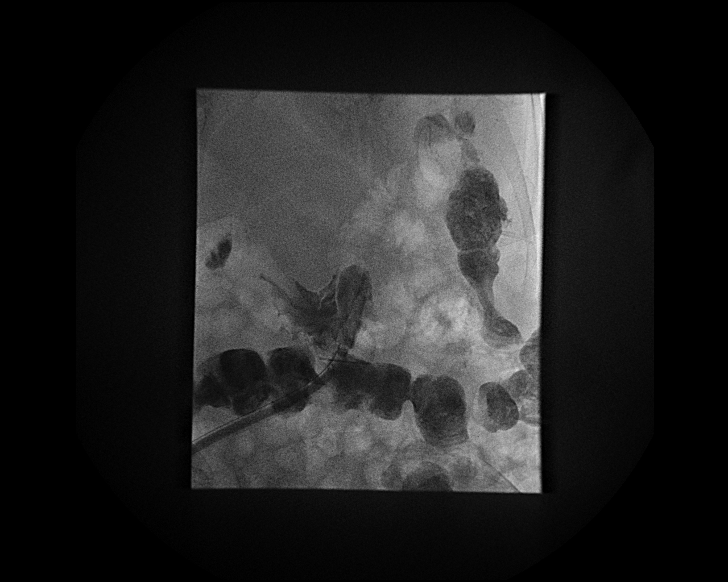
[im 5/13]
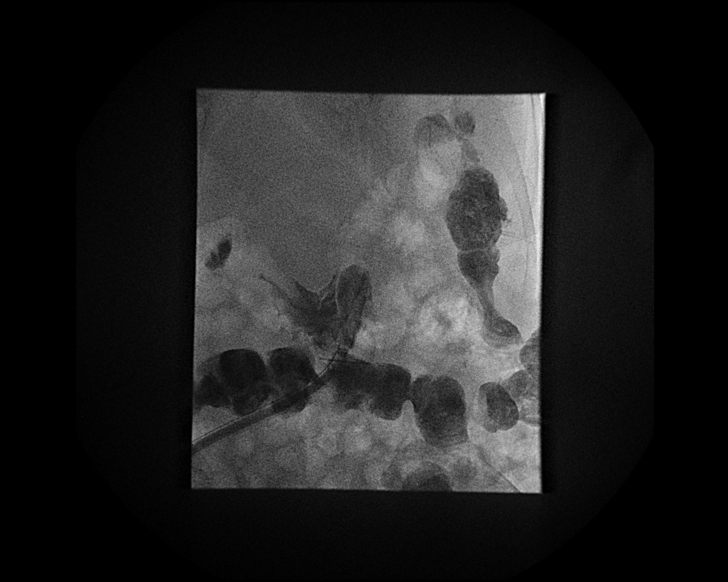
[im 6/13]
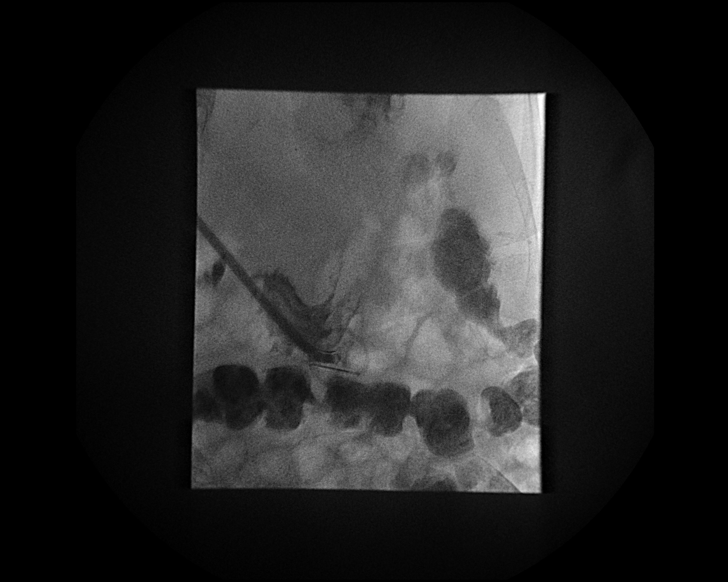
[im 8/13]
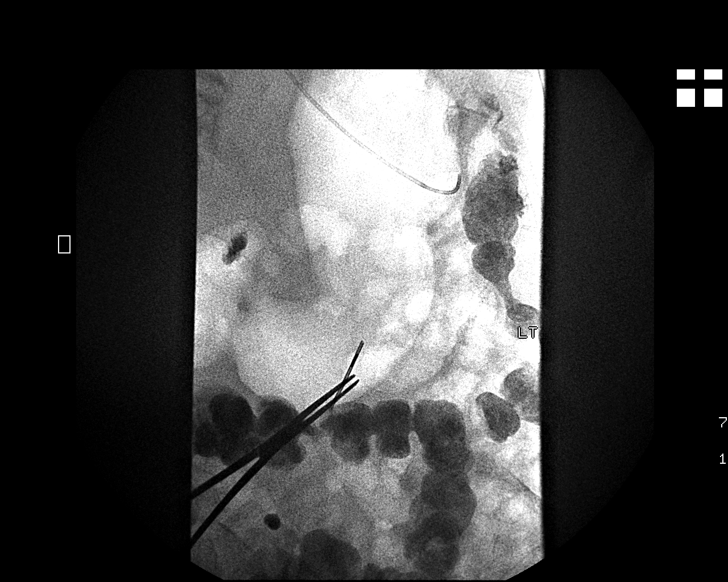
[im 9/13]
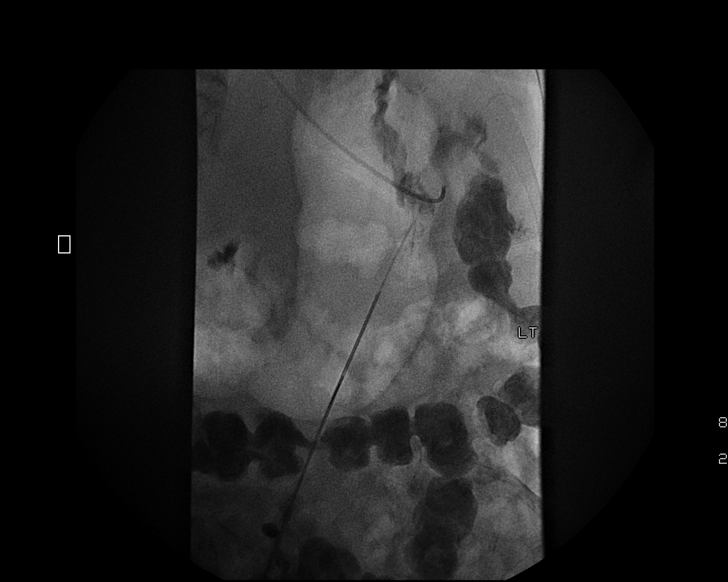
[im 10/13]
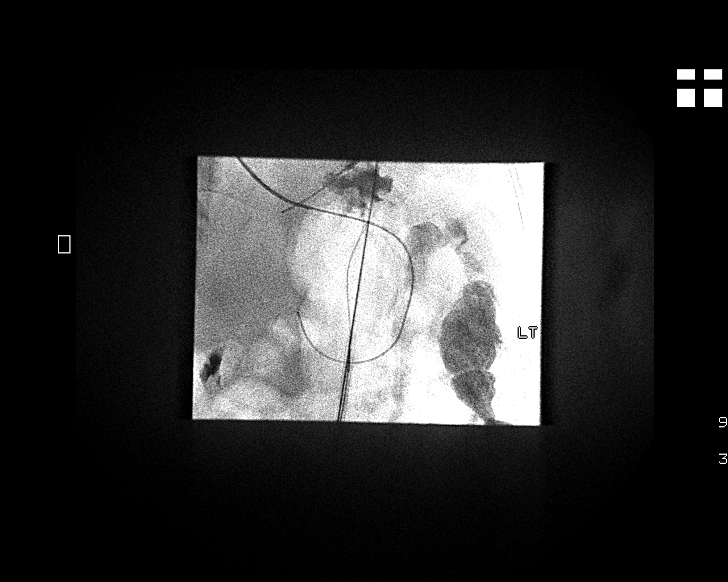
[im 11/13]
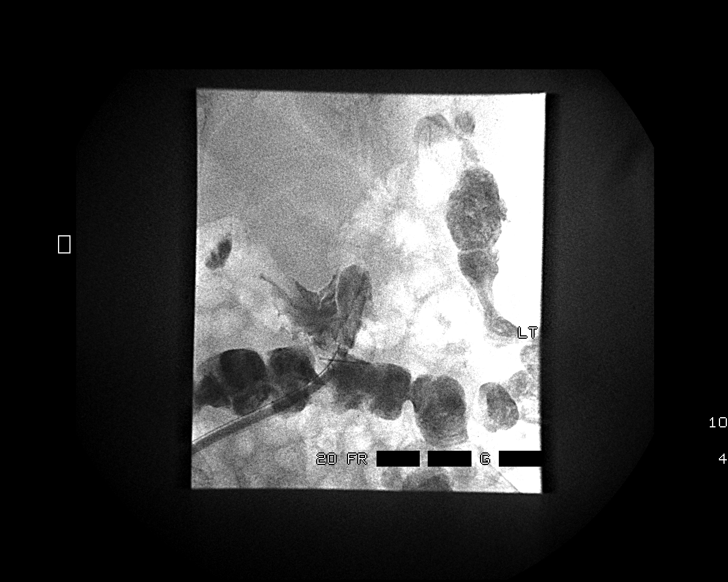
[im 12/13]
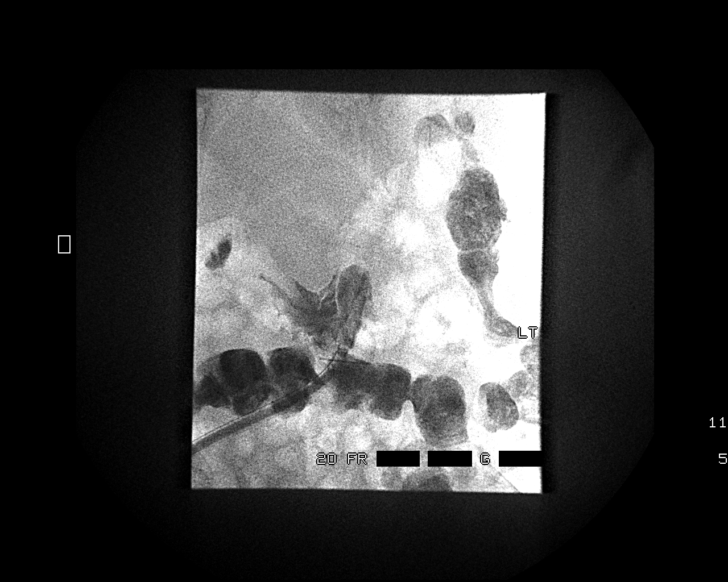
[im 13/13]
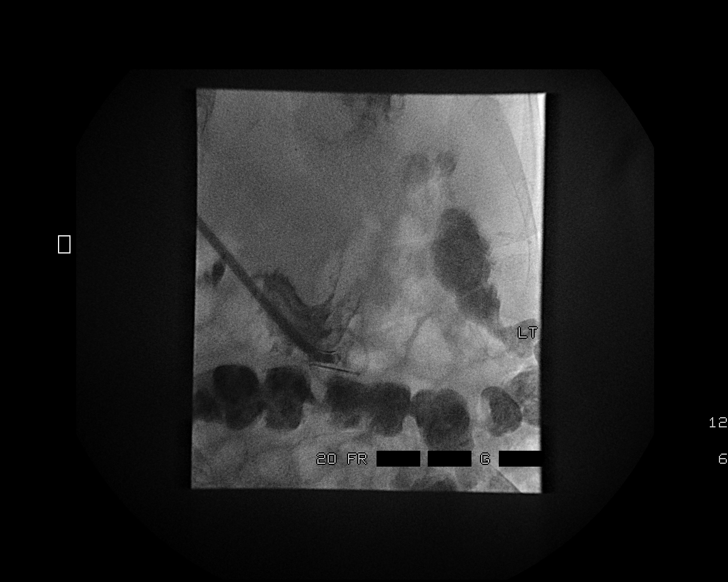

[12 of 13 positions shown; findings below may reference images not displayed]

EXAM:
Fluoroscopic 20 French pull-through gastrostomy

Radiologist:  Tilahun, Lebron

Guidance:  Fluoroscopic

MEDICATIONS AND MEDICAL HISTORY:
2 g Ancefadministered within 1 hour of the procedure,2 mg Versed,
12.5 mcg fentanyl

ANESTHESIA/SEDATION:
15 min

CONTRAST:  20mL OMNIPAQUE IOHEXOL 300 MG/ML  SOLN

FLUOROSCOPY TIME:  2 min 18 seconds

PROCEDURE:
Informed consent was obtained from the patient following explanation
of the procedure, risks, benefits and alternatives. The patient
understands, agrees and consents for the procedure. All questions
were addressed. A time out was performed.

Maximal barrier sterile technique utilized including caps, mask,
sterile gowns, sterile gloves, large sterile drape, hand hygiene,
and betadine prep.

The left upper quadrant was sterilely prepped and draped. An oral
gastric catheter was inserted into the stomach under fluoroscopy.
The existing nasogastric feeding tube was removed. Air was injected
into the stomach for insufflation and visualization under
fluoroscopy. The air distended stomach was confirmed beneath the
anterior abdominal wall in the frontal and lateral projections.
Under sterile conditions and local anesthesia, a 17 gauge trocar
needle was utilized to access the stomach percutaneously beneath the
left subcostal margin. Needle position was confirmed within the
stomach under biplane fluoroscopy. Contrast injection confirmed
position also. A single T tack was deployed for gastropexy. Over an
Amplatz guide wire, a 9-French sheath was inserted into the stomach.
A snare device was utilized to capture the oral gastric catheter.
The snare device was pulled retrograde from the stomach up the
esophagus and out the oropharynx. The 20-French pull-through
gastrostomy was connected to the snare device and pulled antegrade
through the oropharynx down the esophagus into the stomach and then
through the percutaneous tract external to the patient. The
gastrostomy was assembled externally. Contrast injection confirms
position in the stomach. Images were obtained for documentation. The
patient tolerated procedure well. No immediate complication.

COMPLICATIONS:
No immediate
IMPRESSION: Fluoroscopic insertion of a 20-French "pull-through" gastrostomy.

## 2014-12-09 ENCOUNTER — Encounter (HOSPITAL_COMMUNITY): Payer: Self-pay | Admitting: Radiology

## 2017-10-03 ENCOUNTER — Encounter: Payer: Self-pay | Admitting: Physical Medicine & Rehabilitation

## 2017-10-03 ENCOUNTER — Encounter: Payer: Medicare Other | Attending: Physical Medicine & Rehabilitation | Admitting: Physical Medicine & Rehabilitation

## 2017-10-03 VITALS — BP 78/52 | HR 80

## 2017-10-03 DIAGNOSIS — I110 Hypertensive heart disease with heart failure: Secondary | ICD-10-CM | POA: Diagnosis not present

## 2017-10-03 DIAGNOSIS — E039 Hypothyroidism, unspecified: Secondary | ICD-10-CM | POA: Insufficient documentation

## 2017-10-03 DIAGNOSIS — Z8673 Personal history of transient ischemic attack (TIA), and cerebral infarction without residual deficits: Secondary | ICD-10-CM | POA: Insufficient documentation

## 2017-10-03 DIAGNOSIS — I4891 Unspecified atrial fibrillation: Secondary | ICD-10-CM | POA: Insufficient documentation

## 2017-10-03 DIAGNOSIS — I69354 Hemiplegia and hemiparesis following cerebral infarction affecting left non-dominant side: Secondary | ICD-10-CM | POA: Diagnosis not present

## 2017-10-03 DIAGNOSIS — I959 Hypotension, unspecified: Secondary | ICD-10-CM | POA: Insufficient documentation

## 2017-10-03 DIAGNOSIS — E785 Hyperlipidemia, unspecified: Secondary | ICD-10-CM | POA: Insufficient documentation

## 2017-10-03 DIAGNOSIS — R269 Unspecified abnormalities of gait and mobility: Secondary | ICD-10-CM | POA: Diagnosis not present

## 2017-10-03 DIAGNOSIS — I509 Heart failure, unspecified: Secondary | ICD-10-CM | POA: Diagnosis not present

## 2017-10-03 DIAGNOSIS — M545 Low back pain: Secondary | ICD-10-CM | POA: Insufficient documentation

## 2017-10-03 DIAGNOSIS — R252 Cramp and spasm: Secondary | ICD-10-CM | POA: Insufficient documentation

## 2017-10-03 DIAGNOSIS — Z1331 Encounter for screening for depression: Secondary | ICD-10-CM | POA: Diagnosis not present

## 2017-10-03 NOTE — Progress Notes (Addendum)
Subjective:    Patient ID: Tina Chambers, female    DOB: [birth year], 83 y.o.   MRN: [id number]  HPI 83 y/o female with pmh of HTN, Afib, CHF, depression, CVA in 2014 presents for spasticity eval.  Daughter present, who provides history.  Patient is poor historian.  Stroke was in November 2014. CT reviewed, right MCA infact with resulting spastic hemiplegia.  She had therapy and Baclofen and bracing.  She had Botox starting ~2 years at her facility.  However, the physician left the practice.  She believes she was receiving benefit.  Patient with increase in pain.   Today, pt has hypotension, daughter states this is new and patient was drowsy in the car and overall lethargic.  No meds from SNF, which pt resides.  Attempting to contact facility.   Pain Inventory Average Pain 8 Pain Right Now 8 My pain is aching  In the last 24 hours, has pain interfered with the following? General activity 8 Relation with others 8 Enjoyment of life 8 What TIME of day is your pain at its worst? na Sleep (in general) Fair  Pain is worse with: na Pain improves with: na Relief from Meds: na  Mobility ability to climb steps?  no do you drive?  no  Function Do you have any goals in this area?  no  Neuro/Psych No problems in this area  Prior Studies Any changes since last visit?  no  Physicians involved in your care Any changes since last visit?  no   No family history on file. Social History   Socioeconomic History  . Marital status: Unknown    Spouse name: None  . Number of children: None  . Years of education: None  . Highest education level: None  Social Needs  . Financial resource strain: None  . Food insecurity - worry: None  . Food insecurity - inability: None  . Transportation needs - medical: None  . Transportation needs - non-medical: None  Occupational History  . None  Tobacco Use  . Smoking status: Never Smoker  . Smokeless tobacco: Never Used  Substance and  Sexual Activity  . Alcohol use: No    Frequency: Never  . Drug use: No  . Sexual activity: None  Other Topics Concern  . None  Social History Narrative  . None   No past surgical history on file. Past Medical History:  Diagnosis Date  . Atrial fibrillation (HCC)   . CHF (congestive heart failure) (HCC)   . GIB (gastrointestinal bleeding)    recurrent  . Hyperlipidemia   . Hypertension   . Hypothyroid   . Pelvic fracture (HCC)    BP (!) 78/52   Pulse 80   SpO2 96%   Opioid Risk Score:   Fall Risk Score:  `1  Depression screen PHQ 2/9  No flowsheet data found.   Review of Systems  HENT: Negative.   Eyes: Negative.   Respiratory: Negative.   Cardiovascular: Negative.   Gastrointestinal: Negative.   Endocrine: Negative.   Genitourinary: Negative.   Musculoskeletal: Positive for arthralgias, gait problem and myalgias.  Skin: Negative.   Allergic/Immunologic: Negative.   Neurological: Positive for weakness.  Hematological: Negative.   Psychiatric/Behavioral: Negative.   All other systems reviewed and are negative.     Objective:   Physical Exam Gen: NAD. Vital signs reviewed HENT: Normocephalic, Atraumatic Eyes: EOMI. No discharge.  Cardio: RRR. No JVD. Pulm: B/l clear to auscultation.  Effort normal Abd: Soft, BS+ MSK:  Gait not tested.   TTP LUE/LLE.   Neuro: CN II-XII grossly intact.    Sensation intact to light touch in all LUE and LLE dermatomes  Strength  0/5 in all LUE/L myotomes  MAS: elbow extension: 2/4, wrist flexion 3/4, finger flexors 3/4, knee flexors 2/4, APF 1/4  Skin: Warm and Dry. Intact    Assessment & Plan:  83 y/o female with pmh of HTN, Afib, CHF, depression, CVA in 2014 presents for spasticity eval.    1. Left spastic hemiplegia  MRI/CT reviewed, showing right MCA infarct  Labs reviewed, abnormal in Epic, but states drawn at [facility]  Referral information reviewed  NCCSRS reviewed  Encouraged trial of cold  PT after Botox  Cont  Bracing  Will consider TENS  Will schedule for Botox, main goal is to reduce pain   Triceps: 25U   FCR 50U   FCU 50U   FDS 100   FDP 25U   Biceps Femoris 100U   Med Gastrocs 50 U   Total 400 Units   2. Gait abnormality  Cont wheelchair for safety  3. Hypotension  Symptomatic per daughter  Encouraged follow up at SNF, will hold off on Botox if pt remains symptomatic   >45 minutes spent with patient and daughter with >35 minutes in counseling and coordination of care regarding medications, lethargy, Botox

## 2017-10-23 ENCOUNTER — Encounter: Payer: Medicare Other | Admitting: Physical Medicine & Rehabilitation

## 2017-12-11 ENCOUNTER — Encounter: Payer: Self-pay | Admitting: Physical Medicine & Rehabilitation

## 2017-12-11 ENCOUNTER — Encounter: Payer: Medicare Other | Attending: Physical Medicine & Rehabilitation | Admitting: Physical Medicine & Rehabilitation

## 2017-12-11 VITALS — BP 105/70 | HR 75

## 2017-12-11 DIAGNOSIS — E039 Hypothyroidism, unspecified: Secondary | ICD-10-CM | POA: Insufficient documentation

## 2017-12-11 DIAGNOSIS — E785 Hyperlipidemia, unspecified: Secondary | ICD-10-CM | POA: Diagnosis not present

## 2017-12-11 DIAGNOSIS — I110 Hypertensive heart disease with heart failure: Secondary | ICD-10-CM | POA: Diagnosis not present

## 2017-12-11 DIAGNOSIS — R269 Unspecified abnormalities of gait and mobility: Secondary | ICD-10-CM | POA: Diagnosis not present

## 2017-12-11 DIAGNOSIS — R252 Cramp and spasm: Secondary | ICD-10-CM | POA: Insufficient documentation

## 2017-12-11 DIAGNOSIS — I69354 Hemiplegia and hemiparesis following cerebral infarction affecting left non-dominant side: Secondary | ICD-10-CM

## 2017-12-11 DIAGNOSIS — I959 Hypotension, unspecified: Secondary | ICD-10-CM | POA: Diagnosis not present

## 2017-12-11 DIAGNOSIS — Z8673 Personal history of transient ischemic attack (TIA), and cerebral infarction without residual deficits: Secondary | ICD-10-CM | POA: Insufficient documentation

## 2017-12-11 DIAGNOSIS — Z1331 Encounter for screening for depression: Secondary | ICD-10-CM | POA: Diagnosis not present

## 2017-12-11 DIAGNOSIS — I4891 Unspecified atrial fibrillation: Secondary | ICD-10-CM | POA: Insufficient documentation

## 2017-12-11 DIAGNOSIS — S42302D Unspecified fracture of shaft of humerus, left arm, subsequent encounter for fracture with routine healing: Secondary | ICD-10-CM | POA: Diagnosis not present

## 2017-12-11 DIAGNOSIS — M545 Low back pain: Secondary | ICD-10-CM | POA: Diagnosis present

## 2017-12-11 DIAGNOSIS — I509 Heart failure, unspecified: Secondary | ICD-10-CM | POA: Insufficient documentation

## 2017-12-11 NOTE — Progress Notes (Addendum)
Subjective:    Patient ID: Tina Chambers, female    DOB: 02-23-1927, 84 y.o.   MRN: [id number]  HPI 84 y/o female with pmh of HTN, Afib, CHF, depression, CVA in 2014 presents for follow up for spasticity.    Initially stated: Daughter present, who provides history.  Patient is poor historian.  Stroke was in November 2014. CT reviewed, right MCA infact with resulting spastic hemiplegia.  She had therapy and Baclofen and bracing.  She had Botox starting ~2 years at her facility.  However, the physician left the practice.  She believes she was receiving benefit.  Patient with increase in pain.   Since last visit, pt/daughter states she had a left humerus fracture on ~11/20.  She is following up with Ortho.  She has not tried cold therapy.  Her BP has improved. Denies falls. She states she is occasionally using bracing.     Pain Inventory Average Pain 8 Pain Right Now 8 My pain is sharp  In the last 24 hours, has pain interfered with the following? General activity 8 Relation with others 8 Enjoyment of life 8 What TIME of day is your pain at its worst? na Sleep (in general) Fair  Pain is worse with: na Pain improves with: medication Relief from Meds: 5  Mobility ability to climb steps?  no do you drive?  no  Function Do you have any goals in this area?  no  Neuro/Psych weakness numbness trouble walking confusion  Prior Studies Any changes since last visit?  no  Physicians involved in your care Any changes since last visit?  no   No family history on file. Social History   Socioeconomic History  . Marital status: Unknown    Spouse name: None  . Number of children: None  . Years of education: None  . Highest education level: None  Social Needs  . Financial resource strain: None  . Food insecurity - worry: None  . Food insecurity - inability: None  . Transportation needs - medical: None  . Transportation needs - non-medical: None  Occupational History    . None  Tobacco Use  . Smoking status: Never Smoker  . Smokeless tobacco: Never Used  Substance and Sexual Activity  . Alcohol use: No    Frequency: Never  . Drug use: No  . Sexual activity: None  Other Topics Concern  . None  Social History Narrative  . None   No past surgical history on file. Past Medical History:  Diagnosis Date  . Atrial fibrillation (HCC)   . CHF (congestive heart failure) (HCC)   . GIB (gastrointestinal bleeding)    recurrent  . Hyperlipidemia   . Hypertension   . Hypothyroid   . Pelvic fracture (HCC)    BP 105/70   Pulse 75   SpO2 91%   Opioid Risk Score:   Fall Risk Score:  `1  Depression screen PHQ 2/9  No flowsheet data found.   Review of Systems  HENT: Negative.   Eyes: Negative.   Respiratory: Negative.   Cardiovascular: Negative.   Gastrointestinal: Negative.   Endocrine: Negative.   Genitourinary: Negative.   Musculoskeletal: Positive for arthralgias, gait problem and myalgias.  Skin: Negative.   Allergic/Immunologic: Negative.   Neurological: Positive for weakness.  Hematological: Negative.   Psychiatric/Behavioral: Negative.   All other systems reviewed and are negative.     Objective:   Physical Exam Gen: NAD. Vital signs reviewed HENT: Normocephalic, Atraumatic Eyes: EOMI. No discharge.  Cardio: RRR. No JVD. Pulm: B/l clear to auscultation.  Effort normal Abd: Soft, BS+ MSK:  Gait not tested, wheelchair bound.   TTP LUE/LLE.   Neuro:  Strength  0/5 in all LUE/LLE myotomes  MAS: elbow extension: 2/4, wrist flexion 3/4, finger flexors 3/4, knee flexors 2/4, APF 1+/4  Skin: Warm and Dry. Intact    Assessment & Plan:  84 y/o female with pmh of HTN, Afib, CHF, depression, CVA in 2014 presents for follor up for spasticity.  1. Left spastic hemiplegia  Complicated by fracture on 09/2017 - attempted to contact surgery, continue to await call back.  Per daughter, she would like Botox and states she is willing to  incur and risk.    MRI/CT reviewed, showing right MCA infarct  Encouraged trial of cold again  PT after Botox  Cont Bracing, pt states she is only wearing occasionally  Will consider TENS  Will perform Botox, main goal is to reduce pain   Triceps: 25U - will hold off   FCR 50U   FCU 50U   FDS 100   FDP 50U   Biceps Femoris 100U - will hold off   Med Gastrocs 50 U   Total 300 Units   2. Gait abnormality  Cont wheelchair for safety  3. Hypotension  Improved today

## 2017-12-11 NOTE — Progress Notes (Addendum)
Botox: Procedure Note Patient Name: Tina Chambers DOB: [birth year] MRN: [id number]  Date: 12/11/17   Procedure: Botulinum toxin administration Guidance: EMG Diagnosis: Spastic left hemiplegia Attending: Maryla MorrowAnkit Annais Crafts, MD    Informed consent: Risks, benefits & options of the procedure are explained to the patient (and/or family). The patient elects to proceed with procedure. Risks include but are not limited to weakness, respiratory distress, dry mouth, ptosis, antibody formation, worsening of some areas of function. Benefits include decreased abnormal muscle tone, improved hygiene and positioning, decreased skin breakdown and, in some cases, decreased pain. Options include conservative management with oral antispasticity agents, phenol chemodenervation of nerve or at motor nerve branches. More invasive options include intrathecal balcofen adminstration for appropriate candidates. Surgical options may include tendon lengthening or transposition or, rarely, dorsal rhizotomy.   History/Physical Examination: 84 y.o. with pmh of HTN, Afib, CHF, depression, CVA in 2014 presents for spasticity eval.    mAS: elbow extension: 2/4, wrist flexion 3/4, finger flexors 3/4, knee flexors 2/4, APF 1+/4  Previous Treatments: Therapy/Range of motion Indication for guidance: Target active muscules  Procedure: Botulinum toxin was mixed with preservative free saline with a dilution of 1cc to 100 units. Targeted limb and muscles were identified. The skin was prepped with alcohol swabs and placement of needle tip in targeted muscle was confirmed using appropriate guidance. Prior to injection, positioning of needle tip outside of blood vessel was determined by pulling back on syringe plunger.  MUSCLE UNITS Left: FCR 50U         FCU 50U         FDS 100         FDP 50U         Med Gastrocs 50 U  Total units used: 300  Complications: None  Plan: RTC 6 weeks  Tina Chambers Tina Chambers 11:48 AM

## 2017-12-18 ENCOUNTER — Encounter: Payer: Medicare Other | Admitting: Physical Medicine & Rehabilitation

## 2018-01-22 ENCOUNTER — Encounter: Payer: Medicare Other | Admitting: Physical Medicine & Rehabilitation

## 2018-03-13 ENCOUNTER — Other Ambulatory Visit: Payer: Self-pay

## 2018-03-13 ENCOUNTER — Encounter: Payer: Self-pay | Admitting: Physical Medicine & Rehabilitation

## 2018-03-13 ENCOUNTER — Encounter: Payer: Medicare Other | Attending: Physical Medicine & Rehabilitation | Admitting: Physical Medicine & Rehabilitation

## 2018-03-13 VITALS — BP 124/79 | HR 70

## 2018-03-13 DIAGNOSIS — R269 Unspecified abnormalities of gait and mobility: Secondary | ICD-10-CM | POA: Insufficient documentation

## 2018-03-13 DIAGNOSIS — I4891 Unspecified atrial fibrillation: Secondary | ICD-10-CM | POA: Diagnosis not present

## 2018-03-13 DIAGNOSIS — E039 Hypothyroidism, unspecified: Secondary | ICD-10-CM | POA: Diagnosis not present

## 2018-03-13 DIAGNOSIS — R252 Cramp and spasm: Secondary | ICD-10-CM | POA: Insufficient documentation

## 2018-03-13 DIAGNOSIS — I959 Hypotension, unspecified: Secondary | ICD-10-CM | POA: Insufficient documentation

## 2018-03-13 DIAGNOSIS — M545 Low back pain: Secondary | ICD-10-CM | POA: Insufficient documentation

## 2018-03-13 DIAGNOSIS — I69354 Hemiplegia and hemiparesis following cerebral infarction affecting left non-dominant side: Secondary | ICD-10-CM

## 2018-03-13 DIAGNOSIS — Z8673 Personal history of transient ischemic attack (TIA), and cerebral infarction without residual deficits: Secondary | ICD-10-CM | POA: Insufficient documentation

## 2018-03-13 DIAGNOSIS — I110 Hypertensive heart disease with heart failure: Secondary | ICD-10-CM | POA: Insufficient documentation

## 2018-03-13 DIAGNOSIS — E785 Hyperlipidemia, unspecified: Secondary | ICD-10-CM | POA: Diagnosis not present

## 2018-03-13 DIAGNOSIS — Z1331 Encounter for screening for depression: Secondary | ICD-10-CM | POA: Diagnosis not present

## 2018-03-13 DIAGNOSIS — I509 Heart failure, unspecified: Secondary | ICD-10-CM | POA: Insufficient documentation

## 2018-03-13 NOTE — Progress Notes (Signed)
Botox: Procedure Note Patient Name: Tina GhaziMagdalene D Demond DOB: 1927-01-15 MRN: [id number]  Date: 12/11/17   Procedure: Botulinum toxin administration Guidance: EMG Diagnosis: Spastic left hemiplegia Attending: Maryla MorrowAnkit Patel, MD    Informed consent: Risks, benefits & options of the procedure are explained to the patient (and/or family). The patient elects to proceed with procedure. Risks include but are not limited to weakness, respiratory distress, dry mouth, ptosis, antibody formation, worsening of some areas of function. Benefits include decreased abnormal muscle tone, improved hygiene and positioning, decreased skin breakdown and, in some cases, decreased pain. Options include conservative management with oral antispasticity agents, phenol chemodenervation of nerve or at motor nerve branches. More invasive options include intrathecal balcofen adminstration for appropriate candidates. Surgical options may include tendon lengthening or transposition or, rarely, dorsal rhizotomy.   History/Physical Examination: 84 y.o. with pmh of HTN, Afib, CHF, depression, CVA in 2014 presents for spasticity eval.    mAS: Left elbow extension: 2/4, wrist flexion 2/4, finger flexors 3/4, knee flexors 2/4, thumb abductors 3/4, APF 1+/4  Previous Treatments: Therapy/Range of motion Indication for guidance: Target active muscules  Procedure: Botulinum toxin was mixed with preservative free saline with a dilution of 1cc to 100 units. Targeted limb and muscles were identified. The skin was prepped with alcohol swabs and placement of needle tip in targeted muscle was confirmed using appropriate guidance. Prior to injection, positioning of needle tip outside of blood vessel was determined by pulling back on syringe plunger.  MUSCLE UNITS Left: FCR 75U         FCU 75U         FDS 100         FDP 50U         APB 20 Units         Med Gastrocs 80U  Total units used: 400  Complications: None  Plan: RTC 6  weeks  Ankit Anil Patel 11:02 AM

## 2018-04-24 ENCOUNTER — Encounter: Payer: Self-pay | Admitting: Physical Medicine & Rehabilitation

## 2018-04-24 ENCOUNTER — Encounter: Payer: Medicare Other | Attending: Physical Medicine & Rehabilitation | Admitting: Physical Medicine & Rehabilitation

## 2018-04-24 VITALS — BP 104/70 | HR 80 | Resp 14

## 2018-04-24 DIAGNOSIS — R269 Unspecified abnormalities of gait and mobility: Secondary | ICD-10-CM | POA: Insufficient documentation

## 2018-04-24 DIAGNOSIS — I110 Hypertensive heart disease with heart failure: Secondary | ICD-10-CM | POA: Diagnosis not present

## 2018-04-24 DIAGNOSIS — I959 Hypotension, unspecified: Secondary | ICD-10-CM | POA: Insufficient documentation

## 2018-04-24 DIAGNOSIS — I4891 Unspecified atrial fibrillation: Secondary | ICD-10-CM | POA: Diagnosis not present

## 2018-04-24 DIAGNOSIS — Z1331 Encounter for screening for depression: Secondary | ICD-10-CM | POA: Diagnosis not present

## 2018-04-24 DIAGNOSIS — R252 Cramp and spasm: Secondary | ICD-10-CM | POA: Diagnosis not present

## 2018-04-24 DIAGNOSIS — M545 Low back pain: Secondary | ICD-10-CM | POA: Diagnosis not present

## 2018-04-24 DIAGNOSIS — E785 Hyperlipidemia, unspecified: Secondary | ICD-10-CM | POA: Insufficient documentation

## 2018-04-24 DIAGNOSIS — E039 Hypothyroidism, unspecified: Secondary | ICD-10-CM | POA: Diagnosis not present

## 2018-04-24 DIAGNOSIS — I509 Heart failure, unspecified: Secondary | ICD-10-CM | POA: Insufficient documentation

## 2018-04-24 DIAGNOSIS — Z8673 Personal history of transient ischemic attack (TIA), and cerebral infarction without residual deficits: Secondary | ICD-10-CM | POA: Insufficient documentation

## 2018-04-24 DIAGNOSIS — I69354 Hemiplegia and hemiparesis following cerebral infarction affecting left non-dominant side: Secondary | ICD-10-CM

## 2018-04-24 NOTE — Progress Notes (Addendum)
Subjective:    Patient ID: Tina Chambers, female    DOB: 07-08-1927, 84 y.o.   MRN: [id number]  HPI 84 y/o female with pmh of HTN, Afib, CHF, depression, CVA in 2014 presents for follow up for spasticity.    Initially stated: Daughter present, who provides history.  Patient is poor historian.  Stroke was in November 2014. CT reviewed, right MCA infact with resulting spastic hemiplegia.  She had therapy and Baclofen and bracing.  She had Botox starting ~2 years at her facility.  However, the physician left the practice.  She believes she was receiving benefit.  Patient with increase in pain.   Last clinic visit 03/13/18. Patient had Botox injection at that time.  Patient poor historian. Daughter notes some improvement in tone, but has not spoken to therapist.  Pain Inventory Average Pain 8 Pain Right Now 2 My pain is sharp and aching  In the last 24 hours, has pain interfered with the following? General activity 6 Relation with others 4 Enjoyment of life 6 What TIME of day is your pain at its worst? na Sleep (in general) Fair  Pain is worse with: some activites Pain improves with: medication Relief from Meds: 7  Mobility use a wheelchair needs help with transfers  Function Do you have any goals in this area?  no  Neuro/Psych weakness numbness trouble walking confusion  Prior Studies Any changes since last visit?  no  Physicians involved in your care Any changes since last visit?  no   History reviewed. No pertinent family history. Social History   Socioeconomic History  . Marital status: Unknown    Spouse name: Not on file  . Number of children: Not on file  . Years of education: Not on file  . Highest education level: Not on file  Occupational History  . Not on file  Social Needs  . Financial resource strain: Not on file  . Food insecurity:    Worry: Not on file    Inability: Not on file  . Transportation needs:    Medical: Not on file   Non-medical: Not on file  Tobacco Use  . Smoking status: Never Smoker  . Smokeless tobacco: Never Used  Substance and Sexual Activity  . Alcohol use: No    Frequency: Never  . Drug use: No  . Sexual activity: Not on file  Lifestyle  . Physical activity:    Days per week: Not on file    Minutes per session: Not on file  . Stress: Not on file  Relationships  . Social connections:    Talks on phone: Not on file    Gets together: Not on file    Attends religious service: Not on file    Active member of club or organization: Not on file    Attends meetings of clubs or organizations: Not on file    Relationship status: Not on file  Other Topics Concern  . Not on file  Social History Narrative  . Not on file   History reviewed. No pertinent surgical history. Past Medical History:  Diagnosis Date  . Atrial fibrillation (HCC)   . CHF (congestive heart failure) (HCC)   . GIB (gastrointestinal bleeding)    recurrent  . Hyperlipidemia   . Hypertension   . Hypothyroid   . Pelvic fracture (HCC)    BP 104/70 (BP Location: Right Arm, Patient Position: Sitting, Cuff Size: Normal)   Pulse 80   Resp 14   SpO2 95%  Opioid Risk Score:   Fall Risk Score:  `1  Depression screen PHQ 2/9  Depression screen PHQ 2/9 03/13/2018  Decreased Interest 0  Down, Depressed, Hopeless 0  PHQ - 2 Score 0     Review of Systems  Constitutional: Negative.   HENT: Negative.   Eyes: Negative.   Respiratory: Negative.   Cardiovascular: Negative.   Gastrointestinal: Negative.   Endocrine: Negative.   Genitourinary: Negative.   Musculoskeletal: Positive for arthralgias, gait problem and myalgias.  Skin: Negative.   Allergic/Immunologic: Negative.   Neurological: Positive for weakness and numbness.  Hematological: Negative.   Psychiatric/Behavioral: Positive for confusion.  All other systems reviewed and are negative.     Objective:   Physical Exam Gen: NAD. Vital signs reviewed HENT:  Normocephalic, Atraumatic Eyes: EOMI. No discharge.  Cardio: Irregularly irregular. No JVD. Pulm: B/l clear to auscultation.  Effort normal Abd: Soft, BS+ MSK:  Gait not tested, wheelchair bound.   TTP LUE/LLE.   Neuro:  Strength  0/5 in all LUE/LLE myotomes  Sensation intact to light touch  MAS: elbow extension: 1/4, wrist flexion 1+/4, finger flexors 3/4, knee flexors 1/4, APF 1+/4  Skin: Warm and Dry. Intact    Assessment & Plan:  84 y/o female with pmh of HTN, Afib, CHF, depression, CVA in 2014 presents for follow up for spasticity.  1. Left spastic hemiplegia  Complicated by fracture on 09/2017.   MRI/CT reviewed, showing right MCA infarct  Encouraged trial of cold again (x2)  Cont PT after Botox  Cont Bracing, pt states she is only wearing occasionally  Will consider TENS  Cont Botox, main goal is to reduce pain   Left:  FCR 75U    FCU 75U    FDS 100, plan to increase to 150 on next visit            FDP 50U            APB 20 Units            Med Gastrocs 80U  Pain has improved based on reduction in pain meds and decreased complaints.  2. Gait abnormality  Cont wheelchair for safety  >25 minutes spent with patient, with >20 minutes in counseling regarding stroke, ROM, and therapies

## 2018-05-28 ENCOUNTER — Telehealth: Payer: Self-pay | Admitting: Physical Medicine & Rehabilitation

## 2018-05-28 NOTE — Telephone Encounter (Signed)
Patients daughter called to cancel visit this month for Botox.  Patient has suffered another stroke, not sure if she will make it.

## 2018-05-28 NOTE — Telephone Encounter (Signed)
I am sorry to hear that.

## 2018-06-18 ENCOUNTER — Ambulatory Visit: Payer: Medicare Other | Admitting: Physical Medicine & Rehabilitation

## 2019-02-25 DEATH — deceased
